# Patient Record
Sex: Male | Born: 1950
Health system: Southern US, Community
[De-identification: ages and names within clinical notes are randomized; demographics above are authoritative.]

## PROBLEM LIST (undated history)

## (undated) DIAGNOSIS — E0590 Thyrotoxicosis, unspecified without thyrotoxic crisis or storm: Secondary | ICD-10-CM

## (undated) DIAGNOSIS — L259 Unspecified contact dermatitis, unspecified cause: Secondary | ICD-10-CM

## (undated) DIAGNOSIS — T7840XA Allergy, unspecified, initial encounter: Secondary | ICD-10-CM

## (undated) DIAGNOSIS — D509 Iron deficiency anemia, unspecified: Secondary | ICD-10-CM

## (undated) DIAGNOSIS — K2270 Barrett's esophagus without dysplasia: Secondary | ICD-10-CM

## (undated) DIAGNOSIS — I10 Essential (primary) hypertension: Secondary | ICD-10-CM

## (undated) DIAGNOSIS — D510 Vitamin B12 deficiency anemia due to intrinsic factor deficiency: Secondary | ICD-10-CM

## (undated) DIAGNOSIS — E119 Type 2 diabetes mellitus without complications: Principal | ICD-10-CM

## (undated) DIAGNOSIS — Z86010 Personal history of colonic polyps: Secondary | ICD-10-CM

## (undated) DIAGNOSIS — E785 Hyperlipidemia, unspecified: Secondary | ICD-10-CM

## (undated) DIAGNOSIS — K219 Gastro-esophageal reflux disease without esophagitis: Secondary | ICD-10-CM

## (undated) HISTORY — DX: Hyperlipidemia, unspecified: E78.5

## (undated) HISTORY — DX: Barrett's esophagus without dysplasia: K22.70

## (undated) HISTORY — DX: Vitamin B12 deficiency anemia due to intrinsic factor deficiency: D51.0

## (undated) HISTORY — DX: Iron deficiency anemia, unspecified: D50.9

## (undated) HISTORY — PX: COLONOSCOPY: SHX174

## (undated) HISTORY — DX: Gastro-esophageal reflux disease without esophagitis: K21.9

## (undated) HISTORY — DX: Unspecified contact dermatitis, unspecified cause: L25.9

## (undated) HISTORY — PX: ESOPHAGOGASTRODUODENOSCOPY: SHX1529

## (undated) HISTORY — DX: Thyrotoxicosis, unspecified without thyrotoxic crisis or storm: E05.90

## (undated) HISTORY — DX: Allergy, unspecified, initial encounter: T78.40XA

## (undated) HISTORY — DX: Type 2 diabetes mellitus without complications: E11.9

## (undated) HISTORY — DX: Personal history of colonic polyps: Z86.010

## (undated) HISTORY — PX: POLYPECTOMY: SHX149

## (undated) HISTORY — DX: Essential (primary) hypertension: I10

---

## 1997-07-07 ENCOUNTER — Encounter: Admission: RE | Admit: 1997-07-07 | Discharge: 1997-10-05 | Payer: Self-pay | Admitting: Endocrinology

## 1998-03-28 HISTORY — PX: TRANSURETHRAL RESECTION OF PROSTATE: SHX73

## 1999-03-10 ENCOUNTER — Encounter: Admission: RE | Admit: 1999-03-10 | Discharge: 1999-03-10 | Payer: Self-pay | Admitting: Urology

## 1999-03-10 ENCOUNTER — Encounter: Payer: Self-pay | Admitting: Urology

## 1999-03-11 ENCOUNTER — Ambulatory Visit (HOSPITAL_BASED_OUTPATIENT_CLINIC_OR_DEPARTMENT_OTHER): Admission: RE | Admit: 1999-03-11 | Discharge: 1999-03-11 | Payer: Self-pay | Admitting: Urology

## 2002-01-24 DIAGNOSIS — K2270 Barrett's esophagus without dysplasia: Secondary | ICD-10-CM

## 2004-02-16 ENCOUNTER — Ambulatory Visit: Payer: Self-pay | Admitting: Endocrinology

## 2004-02-24 ENCOUNTER — Ambulatory Visit: Payer: Self-pay | Admitting: Endocrinology

## 2004-03-05 ENCOUNTER — Ambulatory Visit: Payer: Self-pay

## 2004-04-21 ENCOUNTER — Ambulatory Visit: Payer: Self-pay | Admitting: Endocrinology

## 2004-06-30 ENCOUNTER — Ambulatory Visit: Payer: Self-pay | Admitting: Internal Medicine

## 2004-07-22 ENCOUNTER — Ambulatory Visit: Payer: Self-pay | Admitting: Internal Medicine

## 2004-07-22 DIAGNOSIS — Z860100 Personal history of colon polyps, unspecified: Secondary | ICD-10-CM

## 2004-07-22 HISTORY — DX: Personal history of colon polyps, unspecified: Z86.0100

## 2004-07-22 HISTORY — DX: Personal history of colonic polyps: Z86.010

## 2004-07-22 LAB — HM COLONOSCOPY

## 2005-02-21 ENCOUNTER — Ambulatory Visit: Payer: Self-pay | Admitting: Endocrinology

## 2005-02-28 ENCOUNTER — Ambulatory Visit: Payer: Self-pay | Admitting: Endocrinology

## 2006-03-23 ENCOUNTER — Ambulatory Visit: Payer: Self-pay | Admitting: Endocrinology

## 2006-03-23 LAB — CONVERTED CEMR LAB
ALT: 26 units/L (ref 0–40)
AST: 22 units/L (ref 0–37)
Albumin: 4.2 g/dL (ref 3.5–5.2)
Alkaline Phosphatase: 44 units/L (ref 39–117)
BUN: 11 mg/dL (ref 6–23)
Basophils Absolute: 0 10*3/uL (ref 0.0–0.1)
Basophils Relative: 0.1 % (ref 0.0–1.0)
Bilirubin Urine: NEGATIVE
CO2: 26 meq/L (ref 19–32)
Calcium: 9.8 mg/dL (ref 8.4–10.5)
Chloride: 107 meq/L (ref 96–112)
Chol/HDL Ratio, serum: 3
Cholesterol: 144 mg/dL (ref 0–200)
Creatinine, Ser: 1.4 mg/dL (ref 0.4–1.5)
Eosinophil percent: 2.7 % (ref 0.0–5.0)
GFR calc non Af Amer: 56 mL/min
Glomerular Filtration Rate, Af Am: 68 mL/min/{1.73_m2}
Glucose, Bld: 112 mg/dL — ABNORMAL HIGH (ref 70–99)
HCT: 37.8 % — ABNORMAL LOW (ref 39.0–52.0)
HDL: 47.5 mg/dL (ref >39.0)
Hemoglobin: 13 g/dL (ref 13.0–17.0)
Ketones, ur: NEGATIVE mg/dL
LDL Cholesterol: 60 mg/dL (ref 0–99)
Leukocytes, UA: NEGATIVE
Lymphocytes Relative: 47.7 % — ABNORMAL HIGH (ref 12.0–46.0)
MCHC: 34.3 g/dL (ref 30.0–36.0)
MCV: 92.9 fL (ref 78.0–100.0)
Monocytes Absolute: 0.4 10*3/uL (ref 0.2–0.7)
Monocytes Relative: 8.5 % (ref 3.0–11.0)
Neutro Abs: 1.8 10*3/uL (ref 1.4–7.7)
Neutrophils Relative %: 41 % — ABNORMAL LOW (ref 43.0–77.0)
Nitrite: NEGATIVE
PSA: 0.71 ng/mL (ref 0.10–4.00)
Platelets: 343 10*3/uL (ref 150–400)
Potassium: 4.2 meq/L (ref 3.5–5.1)
RBC: 4.07 M/uL — ABNORMAL LOW (ref 4.22–5.81)
RDW: 12.4 % (ref 11.5–14.6)
Sodium: 141 meq/L (ref 135–145)
Specific Gravity, Urine: >=1.03 (ref 1.000–1.03)
TSH: 0.27 microintl units/mL — ABNORMAL LOW (ref 0.35–5.50)
Total Bilirubin: 0.9 mg/dL (ref 0.3–1.2)
Total Protein, Urine: NEGATIVE mg/dL
Total Protein: 7.4 g/dL (ref 6.0–8.3)
Triglyceride fasting, serum: 183 mg/dL — ABNORMAL HIGH (ref 0–149)
Urine Glucose: NEGATIVE mg/dL
Urobilinogen, UA: 0.2 (ref 0.0–1.0)
VLDL: 37 mg/dL (ref 0–40)
WBC: 4.5 10*3/uL (ref 4.5–10.5)
pH: 5.5 (ref 5.0–8.0)

## 2006-03-30 ENCOUNTER — Ambulatory Visit: Payer: Self-pay | Admitting: Endocrinology

## 2006-05-09 ENCOUNTER — Ambulatory Visit: Payer: Self-pay | Admitting: Endocrinology

## 2006-05-09 LAB — CONVERTED CEMR LAB
Hgb A1c MFr Bld: 6.8 % — ABNORMAL HIGH (ref 4.6–6.0)
TSH: 0.87 microintl units/mL (ref 0.35–5.50)
Vitamin B-12: 225 pg/mL (ref 211–911)

## 2006-07-31 ENCOUNTER — Ambulatory Visit: Payer: Self-pay | Admitting: Internal Medicine

## 2006-12-13 ENCOUNTER — Ambulatory Visit: Payer: Self-pay | Admitting: Endocrinology

## 2006-12-30 ENCOUNTER — Encounter: Payer: Self-pay | Admitting: *Deleted

## 2006-12-30 DIAGNOSIS — E785 Hyperlipidemia, unspecified: Secondary | ICD-10-CM

## 2006-12-30 DIAGNOSIS — D510 Vitamin B12 deficiency anemia due to intrinsic factor deficiency: Secondary | ICD-10-CM | POA: Insufficient documentation

## 2006-12-30 DIAGNOSIS — K219 Gastro-esophageal reflux disease without esophagitis: Secondary | ICD-10-CM

## 2006-12-30 DIAGNOSIS — E119 Type 2 diabetes mellitus without complications: Secondary | ICD-10-CM

## 2006-12-30 DIAGNOSIS — E1169 Type 2 diabetes mellitus with other specified complication: Secondary | ICD-10-CM

## 2006-12-30 DIAGNOSIS — E1159 Type 2 diabetes mellitus with other circulatory complications: Secondary | ICD-10-CM

## 2006-12-30 DIAGNOSIS — I10 Essential (primary) hypertension: Secondary | ICD-10-CM

## 2006-12-30 HISTORY — DX: Vitamin B12 deficiency anemia due to intrinsic factor deficiency: D51.0

## 2006-12-30 HISTORY — DX: Gastro-esophageal reflux disease without esophagitis: K21.9

## 2006-12-30 HISTORY — DX: Essential (primary) hypertension: I10

## 2006-12-30 HISTORY — DX: Type 2 diabetes mellitus without complications: E11.9

## 2006-12-30 HISTORY — DX: Hyperlipidemia, unspecified: E78.5

## 2007-04-26 ENCOUNTER — Ambulatory Visit: Payer: Self-pay | Admitting: Endocrinology

## 2007-04-26 LAB — CONVERTED CEMR LAB
ALT: 23 units/L (ref 0–53)
AST: 21 units/L (ref 0–37)
Albumin: 4.1 g/dL (ref 3.5–5.2)
Alkaline Phosphatase: 40 units/L (ref 39–117)
BUN: 16 mg/dL (ref 6–23)
Basophils Absolute: 0 10*3/uL (ref 0.0–0.1)
Basophils Relative: 1 % (ref 0.0–1.0)
Bilirubin Urine: NEGATIVE
Bilirubin, Direct: 0.2 mg/dL (ref 0.0–0.3)
CO2: 28 meq/L (ref 19–32)
Calcium: 9.9 mg/dL (ref 8.4–10.5)
Chloride: 103 meq/L (ref 96–112)
Cholesterol: 138 mg/dL (ref 0–200)
Creatinine, Ser: 1.4 mg/dL (ref 0.4–1.5)
Creatinine,U: 283 mg/dL
Eosinophils Absolute: 0.2 10*3/uL (ref 0.0–0.6)
Eosinophils Relative: 3.2 % (ref 0.0–5.0)
GFR calc Af Amer: 67 mL/min
GFR calc non Af Amer: 56 mL/min
Glucose, Bld: 94 mg/dL (ref 70–99)
HCT: 35.6 % — ABNORMAL LOW (ref 39.0–52.0)
HDL: 66.2 mg/dL (ref >39.0)
Hemoglobin, Urine: NEGATIVE
Hemoglobin: 11.9 g/dL — ABNORMAL LOW (ref 13.0–17.0)
Hgb A1c MFr Bld: 6.6 % — ABNORMAL HIGH (ref 4.6–6.0)
LDL Cholesterol: 47 mg/dL (ref 0–99)
Leukocytes, UA: NEGATIVE
Lymphocytes Relative: 39.2 % (ref 12.0–46.0)
MCHC: 33.3 g/dL (ref 30.0–36.0)
MCV: 93.3 fL (ref 78.0–100.0)
Microalb Creat Ratio: 33.2 mg/g — ABNORMAL HIGH (ref 0.0–30.0)
Microalb, Ur: 9.4 mg/dL — ABNORMAL HIGH (ref 0.0–1.9)
Monocytes Absolute: 0.4 10*3/uL (ref 0.2–0.7)
Monocytes Relative: 7.5 % (ref 3.0–11.0)
Neutro Abs: 2.3 10*3/uL (ref 1.4–7.7)
Neutrophils Relative %: 49.1 % (ref 43.0–77.0)
Nitrite: NEGATIVE
PSA: 0.64 ng/mL (ref 0.10–4.00)
Platelets: 277 10*3/uL (ref 150–400)
Potassium: 4.7 meq/L (ref 3.5–5.1)
RBC: 3.81 M/uL — ABNORMAL LOW (ref 4.22–5.81)
RDW: 12.9 % (ref 11.5–14.6)
Sodium: 140 meq/L (ref 135–145)
Specific Gravity, Urine: 1.025 (ref 1.000–1.03)
TSH: 0.63 microintl units/mL (ref 0.35–5.50)
Total Bilirubin: 0.9 mg/dL (ref 0.3–1.2)
Total CHOL/HDL Ratio: 2.1
Total Protein: 7.5 g/dL (ref 6.0–8.3)
Triglycerides: 124 mg/dL (ref 0–149)
Urine Glucose: NEGATIVE mg/dL
Urobilinogen, UA: 0.2 (ref 0.0–1.0)
VLDL: 25 mg/dL (ref 0–40)
WBC: 4.7 10*3/uL (ref 4.5–10.5)
pH: 6 (ref 5.0–8.0)

## 2007-05-03 ENCOUNTER — Ambulatory Visit: Payer: Self-pay | Admitting: Endocrinology

## 2007-05-15 ENCOUNTER — Ambulatory Visit: Payer: Self-pay | Admitting: Internal Medicine

## 2007-05-22 ENCOUNTER — Telehealth (INDEPENDENT_AMBULATORY_CARE_PROVIDER_SITE_OTHER): Payer: Self-pay | Admitting: *Deleted

## 2007-05-30 ENCOUNTER — Encounter: Payer: Self-pay | Admitting: Internal Medicine

## 2007-05-30 ENCOUNTER — Ambulatory Visit: Payer: Self-pay | Admitting: Internal Medicine

## 2007-06-27 ENCOUNTER — Telehealth: Payer: Self-pay | Admitting: Endocrinology

## 2007-06-27 ENCOUNTER — Encounter: Payer: Self-pay | Admitting: Endocrinology

## 2008-01-03 ENCOUNTER — Telehealth: Payer: Self-pay | Admitting: Endocrinology

## 2008-05-05 ENCOUNTER — Ambulatory Visit: Payer: Self-pay | Admitting: Endocrinology

## 2008-05-16 ENCOUNTER — Ambulatory Visit: Payer: Self-pay | Admitting: Internal Medicine

## 2008-05-27 ENCOUNTER — Encounter: Payer: Self-pay | Admitting: Internal Medicine

## 2008-05-27 ENCOUNTER — Ambulatory Visit: Payer: Self-pay | Admitting: Internal Medicine

## 2008-05-30 ENCOUNTER — Encounter: Payer: Self-pay | Admitting: Internal Medicine

## 2008-06-03 ENCOUNTER — Ambulatory Visit: Payer: Self-pay | Admitting: Endocrinology

## 2008-06-03 DIAGNOSIS — E0590 Thyrotoxicosis, unspecified without thyrotoxic crisis or storm: Secondary | ICD-10-CM

## 2008-06-03 HISTORY — DX: Thyrotoxicosis, unspecified without thyrotoxic crisis or storm: E05.90

## 2008-08-12 ENCOUNTER — Ambulatory Visit: Payer: Self-pay | Admitting: Endocrinology

## 2008-08-16 LAB — CONVERTED CEMR LAB
Hgb A1c MFr Bld: 6.3 % (ref 4.6–6.5)
TSH: 0.62 microintl units/mL (ref 0.35–5.50)

## 2009-04-28 ENCOUNTER — Ambulatory Visit: Payer: Self-pay | Admitting: Endocrinology

## 2009-04-29 LAB — CONVERTED CEMR LAB
ALT: 22 units/L (ref 0–53)
AST: 19 units/L (ref 0–37)
Albumin: 3.8 g/dL (ref 3.5–5.2)
Alkaline Phosphatase: 45 units/L (ref 39–117)
BUN: 14 mg/dL (ref 6–23)
Basophils Absolute: 0.1 10*3/uL (ref 0.0–0.1)
Basophils Relative: 0.9 % (ref 0.0–3.0)
Bilirubin Urine: NEGATIVE
Bilirubin, Direct: 0.2 mg/dL (ref 0.0–0.3)
CO2: 29 meq/L (ref 19–32)
Calcium: 9.5 mg/dL (ref 8.4–10.5)
Chloride: 103 meq/L (ref 96–112)
Cholesterol: 134 mg/dL (ref 0–200)
Creatinine, Ser: 1.3 mg/dL (ref 0.4–1.5)
Creatinine,U: 190.2 mg/dL
Eosinophils Absolute: 0.2 10*3/uL (ref 0.0–0.7)
Eosinophils Relative: 3.4 % (ref 0.0–5.0)
GFR calc non Af Amer: 72.68 mL/min (ref >60)
Glucose, Bld: 102 mg/dL — ABNORMAL HIGH (ref 70–99)
HCT: 36.4 % — ABNORMAL LOW (ref 39.0–52.0)
HDL: 70.6 mg/dL (ref >39.00)
Hemoglobin, Urine: NEGATIVE
Hemoglobin: 11.9 g/dL — ABNORMAL LOW (ref 13.0–17.0)
Hgb A1c MFr Bld: 6.5 % (ref 4.6–6.5)
Ketones, ur: NEGATIVE mg/dL
LDL Cholesterol: 28 mg/dL (ref 0–99)
Leukocytes, UA: NEGATIVE
Lymphocytes Relative: 39 % (ref 12.0–46.0)
Lymphs Abs: 2.2 10*3/uL (ref 0.7–4.0)
MCHC: 32.6 g/dL (ref 30.0–36.0)
MCV: 101 fL — ABNORMAL HIGH (ref 78.0–100.0)
Microalb Creat Ratio: 10.5 mg/g (ref 0.0–30.0)
Microalb, Ur: 2 mg/dL — ABNORMAL HIGH (ref 0.0–1.9)
Monocytes Absolute: 0.4 10*3/uL (ref 0.1–1.0)
Monocytes Relative: 6.7 % (ref 3.0–12.0)
Neutro Abs: 2.8 10*3/uL (ref 1.4–7.7)
Neutrophils Relative %: 50 % (ref 43.0–77.0)
Nitrite: NEGATIVE
PSA: 0.99 ng/mL (ref 0.10–4.00)
Platelets: 335 10*3/uL (ref 150.0–400.0)
Potassium: 4.5 meq/L (ref 3.5–5.1)
RBC: 3.61 M/uL — ABNORMAL LOW (ref 4.22–5.81)
RDW: 13.2 % (ref 11.5–14.6)
Sodium: 139 meq/L (ref 135–145)
Specific Gravity, Urine: 1.02 (ref 1.000–1.030)
TSH: 0.35 microintl units/mL (ref 0.35–5.50)
Total Bilirubin: 0.8 mg/dL (ref 0.3–1.2)
Total CHOL/HDL Ratio: 2
Total Protein, Urine: NEGATIVE mg/dL
Total Protein: 7.5 g/dL (ref 6.0–8.3)
Triglycerides: 178 mg/dL — ABNORMAL HIGH (ref 0.0–149.0)
Urine Glucose: NEGATIVE mg/dL
Urobilinogen, UA: 0.2 (ref 0.0–1.0)
VLDL: 35.6 mg/dL (ref 0.0–40.0)
WBC: 5.7 10*3/uL (ref 4.5–10.5)
pH: 5.5 (ref 5.0–8.0)

## 2009-05-15 ENCOUNTER — Ambulatory Visit: Payer: Self-pay | Admitting: Endocrinology

## 2009-05-15 DIAGNOSIS — L259 Unspecified contact dermatitis, unspecified cause: Secondary | ICD-10-CM

## 2009-05-15 HISTORY — DX: Unspecified contact dermatitis, unspecified cause: L25.9

## 2009-05-18 ENCOUNTER — Telehealth: Payer: Self-pay | Admitting: Endocrinology

## 2009-05-20 ENCOUNTER — Encounter: Payer: Self-pay | Admitting: Endocrinology

## 2009-12-21 ENCOUNTER — Telehealth: Payer: Self-pay | Admitting: Endocrinology

## 2009-12-23 ENCOUNTER — Ambulatory Visit: Payer: Self-pay | Admitting: Endocrinology

## 2009-12-23 DIAGNOSIS — D509 Iron deficiency anemia, unspecified: Secondary | ICD-10-CM

## 2009-12-23 HISTORY — DX: Iron deficiency anemia, unspecified: D50.9

## 2009-12-23 LAB — CONVERTED CEMR LAB
Basophils Absolute: 0 10*3/uL (ref 0.0–0.1)
Basophils Relative: 0.7 % (ref 0.0–3.0)
Eosinophils Absolute: 0.2 10*3/uL (ref 0.0–0.7)
Eosinophils Relative: 3.4 % (ref 0.0–5.0)
HCT: 31.3 % — ABNORMAL LOW (ref 39.0–52.0)
Hemoglobin: 10.9 g/dL — ABNORMAL LOW (ref 13.0–17.0)
Hgb A1c MFr Bld: 7 % — ABNORMAL HIGH (ref 4.6–6.5)
Iron: 65 ug/dL (ref 42–165)
Lymphocytes Relative: 36.9 % (ref 12.0–46.0)
Lymphs Abs: 1.7 10*3/uL (ref 0.7–4.0)
MCHC: 34.9 g/dL (ref 30.0–36.0)
MCV: 100.2 fL — ABNORMAL HIGH (ref 78.0–100.0)
Monocytes Absolute: 0.4 10*3/uL (ref 0.1–1.0)
Monocytes Relative: 9.2 % (ref 3.0–12.0)
Neutro Abs: 2.4 10*3/uL (ref 1.4–7.7)
Neutrophils Relative %: 49.8 % (ref 43.0–77.0)
Platelets: 221 10*3/uL (ref 150.0–400.0)
RBC: 3.12 M/uL — ABNORMAL LOW (ref 4.22–5.81)
RDW: 13.8 % (ref 11.5–14.6)
Saturation Ratios: 16 % — ABNORMAL LOW (ref 20.0–50.0)
Transferrin: 289.6 mg/dL (ref 212.0–360.0)
WBC: 4.7 10*3/uL (ref 4.5–10.5)

## 2010-04-25 LAB — CONVERTED CEMR LAB
ALT: 25 units/L (ref 0–53)
AST: 19 units/L (ref 0–37)
Albumin: 4.1 g/dL (ref 3.5–5.2)
Alkaline Phosphatase: 41 units/L (ref 39–117)
BUN: 19 mg/dL (ref 6–23)
Bacteria, UA: NEGATIVE
Basophils Absolute: 0.1 10*3/uL (ref 0.0–0.1)
Basophils Relative: 1.8 % (ref 0.0–3.0)
Bilirubin Urine: NEGATIVE
Bilirubin, Direct: 0.1 mg/dL (ref 0.0–0.3)
CO2: 28 meq/L (ref 19–32)
Calcium: 9.4 mg/dL (ref 8.4–10.5)
Chloride: 100 meq/L (ref 96–112)
Cholesterol: 138 mg/dL (ref 0–200)
Creatinine, Ser: 1.3 mg/dL (ref 0.4–1.5)
Crystals: NEGATIVE
Eosinophils Absolute: 0.1 10*3/uL (ref 0.0–0.7)
Eosinophils Relative: 1.3 % (ref 0.0–5.0)
GFR calc Af Amer: 73 mL/min
GFR calc non Af Amer: 60 mL/min
Glucose, Bld: 86 mg/dL (ref 70–99)
HCT: 35.4 % — ABNORMAL LOW (ref 39.0–52.0)
HDL: 68 mg/dL (ref >39.0)
Hemoglobin, Urine: NEGATIVE
Hemoglobin: 12.2 g/dL — ABNORMAL LOW (ref 13.0–17.0)
Hgb A1c MFr Bld: 6.7 % — ABNORMAL HIGH (ref 4.6–6.0)
LDL Cholesterol: 55 mg/dL (ref 0–99)
Leukocytes, UA: NEGATIVE
Lymphocytes Relative: 36.9 % (ref 12.0–46.0)
MCHC: 34.5 g/dL (ref 30.0–36.0)
MCV: 94.1 fL (ref 78.0–100.0)
Monocytes Absolute: 0.3 10*3/uL (ref 0.1–1.0)
Monocytes Relative: 5 % (ref 3.0–12.0)
Mucus, UA: NEGATIVE
Neutro Abs: 2.8 10*3/uL (ref 1.4–7.7)
Neutrophils Relative %: 55 % (ref 43.0–77.0)
Nitrite: NEGATIVE
PSA: 0.59 ng/mL (ref 0.10–4.00)
Platelets: 221 10*3/uL (ref 150–400)
Potassium: 4.9 meq/L (ref 3.5–5.1)
RBC: 3.76 M/uL — ABNORMAL LOW (ref 4.22–5.81)
RDW: 12.5 % (ref 11.5–14.6)
Sodium: 137 meq/L (ref 135–145)
Specific Gravity, Urine: 1.025 (ref 1.000–1.03)
TSH: 0.29 microintl units/mL — ABNORMAL LOW (ref 0.35–5.50)
Total Bilirubin: 0.8 mg/dL (ref 0.3–1.2)
Total CHOL/HDL Ratio: 2
Total Protein: 7 g/dL (ref 6.0–8.3)
Triglycerides: 77 mg/dL (ref 0–149)
Urine Glucose: NEGATIVE mg/dL
Urobilinogen, UA: 0.2 (ref 0.0–1.0)
VLDL: 15 mg/dL (ref 0–40)
WBC: 5.3 10*3/uL (ref 4.5–10.5)
pH: 5.5 (ref 5.0–8.0)

## 2010-04-27 NOTE — Progress Notes (Signed)
Summary: OV due  Phone Note Outgoing Call Call back at [facility] Phone [phone]   Call placed by: Brenton Grills MA,  December 21, 2009 3:23 PM Call placed to: Patient Details for Reason: OV due Summary of Call: Per MD, pt is due for F/U. Left message on VM to callback  Follow-up for Phone Call        appointment scheduled 12/25/2009 1:00pm Follow-up by: Brenton Grills MA,  December 22, 2009 11:39 AM

## 2010-04-27 NOTE — Assessment & Plan Note (Signed)
Summary: PER ASHLEY FU  STC   Vital Signs:  Patient profile:   60 year old male Height:      75 inches (190.50 cm) Weight:      254.38 pounds (115.63 kg) BMI:     31.91 O2 Sat:      96 % on Room air Temp:     98.4 degrees F (36.89 degrees C) oral Pulse rate:   84 / minute BP sitting:   118 / 72  (left arm) Cuff size:   large  Vitals Entered By: Brenton Grills MA (December 23, 2009 9:11 AM)  O2 Flow:  Room air CC: F/U appt/aj Is Patient Diabetic? Yes   CC:  F/U appt/aj.  History of Present Illness: pt states he feels well in general.  no cbg record, but states cbg's have increased to the mid-100's.  Current Medications (verified): 1)  Glucophage 1000 Mg  Tabs (Metformin Hcl) .... Take 1 By Mouth Qd 2)  Actos 45 Mg  Tabs (Pioglitazone Hcl) .... Take 1 By Mouth Qd 3)  Lisinopril-Hydrochlorothiazide 20-12.5 Mg  Tabs (Lisinopril-Hydrochlorothiazide) .... Take 2 By Mouth Qd 4)  Vytorin 10-80 Mg  Tabs (Ezetimibe-Simvastatin) .... Take 1 By Mouth Qd 5)  Cyanocobalamin 1000 Mcg/ml Inj Soln (Cyanocobalamin) .... Take 1000 Mcg Im Q Month 6)  Carvedilol 3.125 Mg Tabs (Carvedilol) .... Bid 7)  Prilosec Otc 20 Mg Tbec (Omeprazole Magnesium) .... Qd 8)  Onglyza 5 Mg Tabs (Saxagliptin Hcl) .Marland Kitchen.. 1 Qam 9)  Aspirin 81 Mg Tabs (Aspirin) .Marland Kitchen.. 1 Qd 10)  Viagra 100 Mg Tabs (Sildenafil Citrate) .... For As Needed Use 11)  Bd Luer-Lok Syringe 25g X 1" 3 Ml Misc (Syringe/needle (Disp)) .Marland Kitchen.. 1 Q Month  Allergies (verified): No Known Drug Allergies  Past History:  Past Medical History: Last updated: 06/03/2008 ETOH 0-6 a day HYPERTHYROIDISM (ICD-242.90) SPECIAL SCREENING MALIGNANT NEOPLASM OF PROSTATE (ICD-V76.44) ROUTINE GENERAL MEDICAL EXAM@HEALTH  CARE FACL (ICD-V70.0) PERNICIOUS ANEMIA (ICD-281.0) HYPERTENSION (ICD-401.9) HYPERLIPIDEMIA (ICD-272.4) GERD (ICD-530.81) DIABETES MELLITUS, TYPE II (ICD-250.00)  Review of Systems  The patient denies hypoglycemia.    Physical  Exam  General:  obese.  no distress  Pulses:  dorsalis pedis intact bilat.  Extremities:  no deformity.  no ulcer on the feet.  feet are of normal color and temp.  no edema  Neurologic:  sensation is intact to touch on the feet  Additional Exam:  Hemoglobin           [L]  10.9 g/dL                   16.1-09.6 Hematocrit           [L]  31.3 % Iron Saturation      [L]  16.0 %                      20.0-50.0 Hemoglobin A1C       [H]  7.0 %            Impression & Recommendations:  Problem # 1:  DIABETES MELLITUS, TYPE II (ICD-250.00) needs increased rx, if it could be done safely  Problem # 2:  ANEMIA, IRON DEFICIENCY (ICD-280.9) needs increased rx  Other Orders: TLB-CBC Platelet - w/Differential (85025-CBCD) TLB-IBC Pnl (Iron/FE;Transferrin) (83550-IBC) TLB-A1C / Hgb A1C (Glycohemoglobin) (83036-A1C) Est. Patient Level III (04540)  Patient Instructions: 1)  blood tests are being ordered for you today.  please call [phone] to hear your test results. 2)  if you a1c  is high today, options are these 3 (based on the results, we may need to do more than 1 of these): 3)  increase metformin to 2000 mg once daily 4)  add bromocriptione (generic) 5)  add welchol (brand-name, but since this is a cholesterol pill, it would enable you to change vytorin to just the simvastatin component, which is generic).   6)  physical is due feb 2012. 7)  (update: i left message on phone-tree:  you could take same rx for dm, but i would increase rx if i was you.  take fe 1/day).

## 2010-04-27 NOTE — Progress Notes (Signed)
Summary: Rx request  Phone Note Call from Patient Call back at Home Phone [phone]   Caller: Patient Summary of Call: pt called requesting RX for Viagra Initial call taken by: Margaret Pyle, CMA,  May 18, 2009 1:08 PM  Follow-up for Phone Call        sent Follow-up by: Minus Breeding MD,  May 18, 2009 1:19 PM  Additional Follow-up for Phone Call Additional follow up Details #1::        pt informed Additional Follow-up by: Margaret Pyle, CMA,  May 18, 2009 1:23 PM    Prescriptions: VIAGRA 100 MG TABS (SILDENAFIL CITRATE) for as needed use  #10 x 11   Entered and Authorized by:   Minus Breeding MD   Signed by:   Minus Breeding MD on 05/18/2009   Method used:   [facility] to        [facility] [phone]* (retail)       [address]       [place], [place]  [address]       Ph: [phone] or [phone]       Fax: [phone]   RxID:   [id number]

## 2010-04-27 NOTE — Letter (Signed)
Summary: [facility]   Imported By: Sherian Rein 05/25/2009 14:49:01  _____________________________________________________________________  External Attachment:    Type:   Image     Comment:   External Document

## 2010-04-27 NOTE — Assessment & Plan Note (Signed)
Summary: CPX / NWS #   Vital Signs:  Patient profile:   60 year old male Height:      75 inches (190.50 cm) Weight:      242.38 pounds (110.17 kg) BMI:     30.40 O2 Sat:      97 % on Room air Temp:     98.4 degrees F (36.89 degrees C) oral Pulse rate:   70 / minute BP sitting:   128 / 78  (left arm) Cuff size:   large  Vitals Entered By: Josph Macho RMA (May 15, 2009 10:00 AM)  O2 Flow:  Room air CC: Physical/ CF   CC:  Physical/ CF.  History of Present Illness: here for regular wellness examination.  He's feeling pretty well in general, and does not smoke.  he requests a simpler med schedule.   Current Medications (verified): 1)  Aspirin 325 Mg  Tabs (Aspirin) .... Take 1 By Mouth Qd 2)  Glucophage 1000 Mg  Tabs (Metformin Hcl) .... Take 1 By Mouth Qd 3)  Actos 45 Mg  Tabs (Pioglitazone Hcl) .... Take 1 By Mouth Qd 4)  Lisinopril-Hydrochlorothiazide 20-12.5 Mg  Tabs (Lisinopril-Hydrochlorothiazide) .... Take 2 By Mouth Qd 5)  Vytorin 10-80 Mg  Tabs (Ezetimibe-Simvastatin) .... Take 1 By Mouth Qd 6)  Cyanocobalamin 1000 Mcg/ml Inj Soln (Cyanocobalamin) .... Take 1 Injection Q Month 7)  Prandin 2 Mg  Tabs (Repaglinide) .... Take 1 By Mouth Three Times A Day Qd 8)  Carvedilol 3.125 Mg Tabs (Carvedilol) .... Bid 9)  Prilosec Otc 20 Mg Tbec (Omeprazole Magnesium) .... Qd  Allergies (verified): No Known Drug Allergies  Past History:  Past Medical History: Last updated: 06/03/2008 ETOH 0-6 a day HYPERTHYROIDISM (ICD-242.90) SPECIAL SCREENING MALIGNANT NEOPLASM OF PROSTATE (ICD-V76.44) ROUTINE GENERAL MEDICAL EXAM@HEALTH  CARE FACL (ICD-V70.0) PERNICIOUS ANEMIA (ICD-281.0) HYPERTENSION (ICD-401.9) HYPERLIPIDEMIA (ICD-272.4) GERD (ICD-530.81) DIABETES MELLITUS, TYPE II (ICD-250.00)  Family History: Reviewed history from 05/03/2007 and no changes required. no cancer  Social History: Reviewed history from 06/03/2008 and no changes required. 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    Physical Exam  General:  obese.  no distress Head:  head: no deformity eyes: no periorbital swelling, no proptosis external nose and ears are normal mouth: no lesion seen Neck:  Supple without thyroid enlargement or tenderness. No cervical lymphadenopathy, neck masses or tracheal deviation.  Lungs:  Clear to auscultation bilaterally. Normal respiratory effort.  Heart:  Regular rate and rhythm without murmurs or gallops noted. Normal S1,S2.   Abdomen:  abdomen is soft, nontender.  no hepatosplenomegaly.   not distended.  no hernia  Rectal:  normal external and internal exam.  heme neg  Prostate:  Normal size prostate without masses or tenderness.  Msk:  muscle bulk and strength are grossly normal.  no obvious joint swelling.  gait is normal and steady  Pulses:  dorsalis pedis intact bilat.  no carotid bruit  Extremities:  no deformity.  no ulcer on the feet.  feet are of normal color and temp.  no edema  Neurologic:  cn 2-12 grossly intact.   readily moves all 4's.   sensation is intact to touch on the feet  Skin:  normal texture and temp.  no rash.  not diaphoretic  Cervical  Nodes:  No significant adenopathy.  Psych:  Alert and cooperative; normal mood and affect; normal attention span and concentration.     Impression & Recommendations:  Problem # 1:  ROUTINE GENERAL MEDICAL EXAM@HEALTH  CARE FACL (ICD-V70.0)  Medications Added to Medication List This Visit: 1)  Cyanocobalamin 1000 Mcg/ml Inj Soln (Cyanocobalamin) .... Take 1000 mcg im q month 2)  Onglyza 5 Mg Tabs (Saxagliptin hcl) .Marland Kitchen.. 1 qam 3)  Aspirin 81 Mg Tabs (Aspirin) .Marland Kitchen.. 1  qd 4)  Viagra 100 Mg Tabs (Sildenafil citrate) .... For as needed use 5)  Bd Luer-lok Syringe 25g X 1" 3 Ml Misc (Syringe/needle (disp)) .Marland Kitchen.. 1 q month  Other Orders: EKG w/ Interpretation (93000) Vit B12 1000 mcg (J3420) Admin of Therapeutic Inj  intramuscular or subcutaneous ([address]109) Pneumococcal Vaccine (60454) Admin 1st Vaccine (09811) Est. Patient 40-64 years (91478)  Patient Instructions: 1)  change prandin to onglyza 5 mg each am. 2)  a1c in 3-4 months 250.00. 3)  i sent rx to pharmacy so pt's wife (nurse) could give pt b-12 at home. Prescriptions: BD LUER-LOK SYRINGE 25G X 1" 3 ML MISC (SYRINGE/NEEDLE (DISP)) 1 q month  #5 x 2   Entered and Authorized by:   Minus Breeding MD   Signed by:   Minus Breeding MD on 05/15/2009   Method used:   Electronically to        [facility] [phone]* (retail)       [address]       [place], [place]  [address]       Ph: [phone] or [phone]       Fax: [phone]   RxID:   [id number] CYANOCOBALAMIN 1000 MCG/ML INJ SOLN (CYANOCOBALAMIN) take 1000 mcg im q month  #1 bottle x 5   Entered and Authorized by:   Minus Breeding MD   Signed by:   Minus Breeding MD on 05/15/2009   Method used:   Electronically to        [facility] [phone]* (retail)       [address]       [place], [place]  [address]       Ph: [phone] or [phone]       Fax: [phone]   RxID:   [id number] CARVEDILOL 3.125 MG TABS (CARVEDILOL) bid  #60 x 11   Entered and Authorized by:   Minus Breeding MD   Signed by:   Minus Breeding MD on 05/15/2009   Method used:   Electronically to        [facility] [phone]* (retail)       [address]       [place], [place]  [address]       Ph: [phone] or [phone]       Fax: [phone]   RxID:   [id number] VYTORIN 10-80 MG  TABS (EZETIMIBE-SIMVASTATIN) take 1 by mouth qd  #30 x 11   Entered and Authorized by:   Minus Breeding MD   Signed by:   Minus Breeding MD on 05/15/2009   Method used:    Electronically to        [facility] [phone]* (retail)       [address]       [place], [place]  [address]       Ph: [phone] or [phone]       Fax: [phone]   RxID:   [id number] LISINOPRIL-HYDROCHLOROTHIAZIDE 20-12.5  MG  TABS (LISINOPRIL-HYDROCHLOROTHIAZIDE) take 2 by mouth qd  #60 x 11   Entered and Authorized by:   Minus Breeding MD   Signed by:   Minus Breeding MD on 05/15/2009   Method used:   Electronically to        [facility] [phone]* (retail)       [address]       [place], [place]  [address]       Ph: [phone] or [phone]       Fax: [phone]   RxID:   [id number] ACTOS 45 MG  TABS (PIOGLITAZONE HCL) take 1 by mouth qd  #30 x 11   Entered and Authorized by:   Minus Breeding MD   Signed by:   Minus Breeding MD on 05/15/2009   Method used:   Electronically to        [facility] [phone]* (retail)       [address]       [place], [place]  [address]       Ph: [phone] or [phone]       Fax: [phone]   RxID:   [id number] GLUCOPHAGE 1000 MG  TABS (METFORMIN HCL) take 1 by mouth qd  #30 x 11   Entered and Authorized by:   Minus Breeding MD   Signed by:   Minus Breeding MD on 05/15/2009   Method used:   Electronically to        [facility] [phone]* (retail)       [address]       [place], [place]  [address]       Ph: [phone] or [phone]       Fax: [phone]   RxID:   [id number] ONGLYZA 5 MG TABS (SAXAGLIPTIN HCL) 1 qam  #30 x 11   Entered and Authorized by:   Minus Breeding MD   Signed by:   Minus Breeding MD on 05/15/2009   Method used:   Electronically to        [facility] [phone]* (retail)       [address]       [place], [place]  [address]       Ph: [phone] or [phone]       Fax: [phone]   RxID:   [id number]   Preventive Care Screening  Last Flu Shot:    Date:  12/26/2008    Results:  historical     Medication Administration  Injection # 1:    Medication: Vit B12 1000 mcg     Diagnosis: PERNICIOUS ANEMIA (ICD-281.0)    Route: IM    Site: L deltoid    Exp Date: 9/12    Lot #: [id number]    Mfr: [employer] Regent    Patient tolerated injection without complications    Given by: Josph Macho RMA (May 15, 2009 10:17 AM)  Orders Added: 1)  EKG w/ Interpretation [93000] 2)  Vit B12 1000 mcg [J3420] 3)  Admin of Therapeutic Inj  intramuscular or subcutaneous [96372] 4)  Pneumococcal Vaccine [90732] 5)  Admin 1st Vaccine [90471] 6)  Est. Patient 40-64 years [99396]   Preventive Care Screening  Last Flu Shot:    Date:  12/26/2008    Results:  historical     Immunizations Administered:  Pneumonia Vaccine:    Vaccine Type: Pneumovax    Site: right deltoid    Mfr: Merck  Dose: 0.5 ml    Route: IM    Given by: Josph Macho RMA    Exp. Date: 04/23/2010    Lot #: [id number]    VIS given: 10/24/95 version given May 15, 2009.

## 2010-05-10 ENCOUNTER — Other Ambulatory Visit: Payer: Self-pay | Admitting: Endocrinology

## 2010-05-10 ENCOUNTER — Encounter (INDEPENDENT_AMBULATORY_CARE_PROVIDER_SITE_OTHER): Payer: Self-pay | Admitting: *Deleted

## 2010-05-10 ENCOUNTER — Other Ambulatory Visit: Payer: 59

## 2010-05-10 DIAGNOSIS — Z0389 Encounter for observation for other suspected diseases and conditions ruled out: Secondary | ICD-10-CM

## 2010-05-10 DIAGNOSIS — E119 Type 2 diabetes mellitus without complications: Secondary | ICD-10-CM

## 2010-05-10 DIAGNOSIS — Z0000 Encounter for general adult medical examination without abnormal findings: Secondary | ICD-10-CM

## 2010-05-10 LAB — CBC WITH DIFFERENTIAL/PLATELET
Basophils Absolute: 0 10*3/uL (ref 0.0–0.1)
Eosinophils Absolute: 0.1 10*3/uL (ref 0.0–0.7)
HCT: 33.5 % — ABNORMAL LOW (ref 39.0–52.0)
Hemoglobin: 11.3 g/dL — ABNORMAL LOW (ref 13.0–17.0)
Lymphs Abs: 1.6 10*3/uL (ref 0.7–4.0)
MCHC: 33.8 g/dL (ref 30.0–36.0)
MCV: 97.2 fl (ref 78.0–100.0)
Monocytes Absolute: 0.3 10*3/uL (ref 0.1–1.0)
Neutro Abs: 2 10*3/uL (ref 1.4–7.7)
Platelets: 229 10*3/uL (ref 150.0–400.0)
RDW: 13.9 % (ref 11.5–14.6)

## 2010-05-10 LAB — MICROALBUMIN / CREATININE URINE RATIO: Microalb, Ur: 6.3 mg/dL — ABNORMAL HIGH (ref 0.0–1.9)

## 2010-05-10 LAB — URINALYSIS
Bilirubin Urine: NEGATIVE
Hgb urine dipstick: NEGATIVE
Nitrite: NEGATIVE
Total Protein, Urine: NEGATIVE
Urine Glucose: NEGATIVE
pH: 5.5 (ref 5.0–8.0)

## 2010-05-10 LAB — BASIC METABOLIC PANEL
BUN: 21 mg/dL (ref 6–23)
CO2: 26 mEq/L (ref 19–32)
Chloride: 98 mEq/L (ref 96–112)
GFR: 67.6 mL/min (ref >60.00)
Glucose, Bld: 129 mg/dL — ABNORMAL HIGH (ref 70–99)
Potassium: 5 mEq/L (ref 3.5–5.1)
Sodium: 139 mEq/L (ref 135–145)

## 2010-05-10 LAB — HEPATIC FUNCTION PANEL
AST: 22 U/L (ref 0–37)
Albumin: 3.9 g/dL (ref 3.5–5.2)
Total Bilirubin: 0.9 mg/dL (ref 0.3–1.2)

## 2010-05-10 LAB — TSH: TSH: 0.59 u[IU]/mL (ref 0.35–5.50)

## 2010-05-10 LAB — LIPID PANEL
Cholesterol: 153 mg/dL (ref 0–200)
HDL: 84.5 mg/dL (ref >39.00)
VLDL: 29.6 mg/dL (ref 0.0–40.0)

## 2010-05-10 LAB — PSA: PSA: 1.43 ng/mL (ref 0.10–4.00)

## 2010-05-10 LAB — HEMOGLOBIN A1C: Hgb A1c MFr Bld: 6.9 % — ABNORMAL HIGH (ref 4.6–6.5)

## 2010-05-18 ENCOUNTER — Encounter: Payer: Self-pay | Admitting: Endocrinology

## 2010-05-18 ENCOUNTER — Encounter (INDEPENDENT_AMBULATORY_CARE_PROVIDER_SITE_OTHER): Payer: 59 | Admitting: Endocrinology

## 2010-05-18 DIAGNOSIS — E119 Type 2 diabetes mellitus without complications: Secondary | ICD-10-CM

## 2010-05-18 DIAGNOSIS — Z0000 Encounter for general adult medical examination without abnormal findings: Secondary | ICD-10-CM

## 2010-05-25 NOTE — Assessment & Plan Note (Signed)
Summary: CPX/NWS  #   Vital Signs:  Patient profile:   60 year old male Height:      75 inches (190.50 cm) Weight:      252.13 pounds (114.60 kg) BMI:     31.63 O2 Sat:      98 % on Room air Temp:     98.4 degrees F (36.89 degrees C) oral Pulse rate:   75 / minute Pulse rhythm:   regular BP sitting:   128 / 80  (left arm) Cuff size:   large  Vitals Entered By: Brenton Grills CMA Duncan Dull) (May 18, 2010 10:05 AM)  O2 Flow:  Room air CC: CPX/aj Is Patient Diabetic? Yes   CC:  CPX/aj.  History of Present Illness: here for regular wellness examination.  He's feeling pretty well in general, and does not smoke.  Current Medications (verified): 1)  Glucophage 1000 Mg  Tabs (Metformin Hcl) .... Take 1 By Mouth Qd 2)  Actos 45 Mg  Tabs (Pioglitazone Hcl) .... Take 1 By Mouth Qd 3)  Lisinopril-Hydrochlorothiazide 20-12.5 Mg  Tabs (Lisinopril-Hydrochlorothiazide) .... Take 2 By Mouth Qd 4)  Vytorin 10-80 Mg  Tabs (Ezetimibe-Simvastatin) .... Take 1 By Mouth Qd 5)  Cyanocobalamin 1000 Mcg/ml Inj Soln (Cyanocobalamin) .... Take 1000 Mcg Im Q Month 6)  Carvedilol 3.125 Mg Tabs (Carvedilol) .... Bid 7)  Prilosec Otc 20 Mg Tbec (Omeprazole Magnesium) .... Qd 8)  Onglyza 5 Mg Tabs (Saxagliptin Hcl) .Marland Kitchen.. 1 Qam 9)  Aspirin 81 Mg Tabs (Aspirin) .Marland Kitchen.. 1 Qd 10)  Viagra 100 Mg Tabs (Sildenafil Citrate) .... For As Needed Use 11)  Bd Luer-Lok Syringe 25g X 1" 3 Ml Misc (Syringe/needle (Disp)) .Marland Kitchen.. 1 Q Month  Allergies (verified): No Known Drug Allergies  Past History:  Past Medical History: Last updated: 06/03/2008 ETOH 0-6 a day HYPERTHYROIDISM (ICD-242.90) SPECIAL SCREENING MALIGNANT NEOPLASM OF PROSTATE (ICD-V76.44) ROUTINE GENERAL MEDICAL EXAM@HEALTH  CARE FACL (ICD-V70.0) PERNICIOUS ANEMIA (ICD-281.0) HYPERTENSION (ICD-401.9) HYPERLIPIDEMIA (ICD-272.4) GERD (ICD-530.81) DIABETES MELLITUS, TYPE II (ICD-250.00)  Family History: Reviewed history from 05/03/2007 and no changes  required. no cancer father decreased from mi at age 22  Social History: Reviewed history from 05/15/2009 and no changes required. 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         gerd is well-controlled.  denies decreased urinary stream  Physical Exam  General:  obese.  no distress  Head:  head: no deformity eyes: no periorbital swelling, no proptosis external nose and ears are normal mouth: no lesion seen Neck:  Supple without thyroid enlargement or tenderness.  Lungs:  Clear to auscultation bilaterally. Normal respiratory effort.  Heart:  Regular rate and rhythm without murmurs or gallops noted. Normal S1,S2.   Abdomen:  abdomen is soft, nontender.  no hepatosplenomegaly.   not distended.  no hernia  Rectal:  normal external and internal exam.  heme neg  Prostate:  Normal size prostate without masses or tenderness.  Msk:  muscle bulk and strength are grossly normal.  no obvious joint swelling.  gait is normal and steady  Pulses:  dorsalis pedis intact bilat.  no carotid bruit Extremities:  no deformity.  no ulcer on the feet.  feet are of normal color and temp.  no edema  Neurologic:  sensation is  intact to touch on the feet cn 2-12 grossly intact.   readily moves all 4's.     Skin:  normal texture and temp.  no rash.  not diaphoretic  Cervical Nodes:  No significant adenopathy.  Psych:  Alert and cooperative; normal mood and affect; normal attention span and concentration.     Impression & Recommendations:  Problem # 1:  ROUTINE GENERAL MEDICAL EXAM@HEALTH  CARE FACL (ICD-V70.0)  Problem # 2:  DIABETES MELLITUS, TYPE II (ICD-250.00) needs increased rx  Problem # 3:  ANEMIA, IRON  DEFICIENCY (ICD-280.9) needs increased rx  Medications Added to Medication List This Visit: 1)  Glucophage 1000 Mg Tabs (Metformin hcl) .... Take 1 by mouth two times a day  Other Orders: EKG w/ Interpretation (93000) Est. Patient 40-64 years (16109)   Patient Instructions: 1)  increase iron to 2 pills/day. 2)  increase metformin to 1000 mg two times a day. 3)  please consider these measures for your health:  minimize alcohol.  do not use tobacco products.  have a colonoscopy at least every 10 years from age 27.  keep firearms safely stored.  always use seat belts.  have working smoke alarms in your home.  see an eye doctor and dentist regularly.  never drive under the influence of alcohol or drugs (including prescription drugs).   4)  please let me know what your wishes would be, if artificial life support measures should become necessary.  it is critically important to prevent falling down (keep floor areas well-lit, dry, and free of loose objects). 5)  good diet and exercise habits significanly improve the control of your diabetes.  please let me know if you wish to be referred to a dietician.  high blood sugar is very risky to your health.  you should see an eye doctor every year. 6)  controlling your blood pressure and cholesterol drastically reduces the damage diabetes does to your body.  this also applies to quitting smoking.  please discuss these with your doctor.  you should take an aspirin every day, unless you have been advised by a doctor not to. 7)  Please schedule a follow-up appointment in 6 months. Prescriptions: GLUCOPHAGE 1000 MG  TABS (METFORMIN HCL) take 1 by mouth two times a day  #60 x 11   Entered and Authorized by:   Minus Breeding MD   Signed by:   Minus Breeding MD on 05/18/2010   Method used:   Electronically to        [facility] [phone]* (retail)       [address]       [place], [place]  [address]       Ph: [phone] or [phone]       Fax: [phone]    RxID:   [id number]    Orders Added: 1)  EKG w/ Interpretation [93000] 2)  Est. Patient 40-64 years [phone]

## 2010-07-08 LAB — GLUCOSE, CAPILLARY: Glucose-Capillary: 120 mg/dL — ABNORMAL HIGH (ref 70–99)

## 2010-08-13 NOTE — Op Note (Signed)
[place]  Patient:    Mitchell Alvarado                      MRN: [id number] Proc. Date: 03/11/99 Adm. Date:  [id number] Attending:  Thermon Leyland CC:         Justine Null, M.D. [facility]                           Operative Report  PREOPERATIVE DIAGNOSIS: 1. Benign prostatic hypertrophy. 2. Bladder neck obstruction. 3. Urinary retention.  POSTOPERATIVE DIAGNOSIS: 1. Benign prostatic hypertrophy. 2. Bladder neck obstruction. 3. Urinary retention.  OPERATION PERFORMED:  Transurethral resection of the prostate.  SURGEON:  Barron Alvine, M.D.  ANESTHESIA:  General.  INDICATIONS FOR PROCEDURE:  Mr. Quintela has been undergoing evaluation for some longstanding progressive voiding symptoms with significant hesitancy and a weak  stream as well as a sensation of incomplete emptying.  We performed further evaluation.  We attempted to get a flow rate, but his flow rate was so small and slow that it was difficult for the computer to register his flow rate.  We then  performed a post void residual by ultrasound and it was markedly elevated.  A catheter was placed and he had 1400 cc of post void residual.  Cystoscopy revealed relatively small lateral lobes but a very prominent middle lobe that was ball-valving right into his prostatic urethra and causing significant visual obstruction.  The bladder was thickened with trabeculation.  We felt that the patient did have significant outlet obstruction from BPH.  We felt that his bladder may have been damaged significantly and was probably hypocontractile and atonic, given the extremely large post void residual.  His creatinine was normal.  We discussed options with him. Overall, I did not feel his prostate was terribly large and that he was a candidate for endoscopic resection.  He was not really a candidate for microwave thermotherapy and while a TUNA was possible, I did not think that was the  best option.  We felt he was a good candidate for resection f his prostate in a traditional manner.  We decided we would not be aggressive in the apex or on the lateral lobes to try to minimize complications but to remove the  middle lobe as aggressively as possible.  He understood the advantages and disadvantages as well as all the potential risks of TURP.  DESCRIPTION OF PROCEDURE:  The patient was brought to the operating room where e had successful induction of general anesthesia.  He was placed in lithotomy position and prepped and draped in the usual manner.  Cystoscopy was performed nd some representative pictures were taken.  We then performed a transurethral resection utilizing a 27 French continuous flow resectoscope.  Glycine was used as an irrigant.  We started the resection by taking down the middle lobe after identification of the ureteral orifices.  The middle lobe was taken down to the  capsular fibers of the bladder neck.  We then resected some of the lateral lobe  tissue especially near the bladder neck.  We purposely left some apical tissue nd tried not to resect overly aggressively along the lateral lobes to minimize any  chance of nerve injury.  Hemostasis remained quite good and there was really minimal bleeding.  Ureteral orifices were identified throughout the procedure.  Approximately 15 to 20 gm of TUR tissue were resected and evacuated from  the bladder and sent for pathologic analysis.  At the end of the procedure, a 24 [place] 3-way Foley catheter was placed and the urine was quite clear on travel to recovery room.  No obvious complications occurred. DD:  03/11/99 TD:  03/12/99 Job: [id number]

## 2010-08-30 ENCOUNTER — Other Ambulatory Visit: Payer: Self-pay | Admitting: Emergency Medicine

## 2010-08-30 DIAGNOSIS — R9389 Abnormal findings on diagnostic imaging of other specified body structures: Secondary | ICD-10-CM

## 2010-08-31 ENCOUNTER — Ambulatory Visit
Admission: RE | Admit: 2010-08-31 | Discharge: 2010-08-31 | Disposition: A | Discharge status: Home / Self Care | Payer: 59 | Source: Ambulatory Visit | Attending: Emergency Medicine | Admitting: Emergency Medicine

## 2010-08-31 DIAGNOSIS — R9389 Abnormal findings on diagnostic imaging of other specified body structures: Secondary | ICD-10-CM

## 2010-10-04 ENCOUNTER — Other Ambulatory Visit: Payer: Self-pay | Admitting: Endocrinology

## 2011-03-30 ENCOUNTER — Telehealth: Payer: Self-pay | Admitting: *Deleted

## 2011-03-30 DIAGNOSIS — Z0389 Encounter for observation for other suspected diseases and conditions ruled out: Secondary | ICD-10-CM

## 2011-03-30 DIAGNOSIS — Z0000 Encounter for general adult medical examination without abnormal findings: Secondary | ICD-10-CM

## 2011-03-30 DIAGNOSIS — E119 Type 2 diabetes mellitus without complications: Secondary | ICD-10-CM

## 2011-03-30 NOTE — Telephone Encounter (Signed)
Labs placed into Epic for upcoming CPX appointment.  

## 2011-04-17 ENCOUNTER — Other Ambulatory Visit: Payer: Self-pay | Admitting: Endocrinology

## 2011-05-12 ENCOUNTER — Other Ambulatory Visit (INDEPENDENT_AMBULATORY_CARE_PROVIDER_SITE_OTHER): Payer: 59

## 2011-05-12 DIAGNOSIS — Z0389 Encounter for observation for other suspected diseases and conditions ruled out: Secondary | ICD-10-CM

## 2011-05-12 DIAGNOSIS — E119 Type 2 diabetes mellitus without complications: Secondary | ICD-10-CM

## 2011-05-12 DIAGNOSIS — Z0000 Encounter for general adult medical examination without abnormal findings: Secondary | ICD-10-CM

## 2011-05-12 LAB — BASIC METABOLIC PANEL
BUN: 20 mg/dL (ref 6–23)
CO2: 24 mEq/L (ref 19–32)
Chloride: 108 mEq/L (ref 96–112)
GFR: 64.66 mL/min (ref >60.00)
Glucose, Bld: 107 mg/dL — ABNORMAL HIGH (ref 70–99)
Potassium: 5.3 mEq/L — ABNORMAL HIGH (ref 3.5–5.1)

## 2011-05-12 LAB — URINALYSIS, ROUTINE W REFLEX MICROSCOPIC
Bilirubin Urine: NEGATIVE
Leukocytes, UA: NEGATIVE
Nitrite: NEGATIVE
Urobilinogen, UA: 0.2 (ref 0.0–1.0)

## 2011-05-12 LAB — CBC WITH DIFFERENTIAL/PLATELET
Basophils Relative: 1.1 % (ref 0.0–3.0)
Eosinophils Relative: 2.9 % (ref 0.0–5.0)
MCV: 98.2 fl (ref 78.0–100.0)
Monocytes Absolute: 0.4 10*3/uL (ref 0.1–1.0)
Monocytes Relative: 8 % (ref 3.0–12.0)
Neutrophils Relative %: 53.7 % (ref 43.0–77.0)
RBC: 3.28 Mil/uL — ABNORMAL LOW (ref 4.22–5.81)
WBC: 4.6 10*3/uL (ref 4.5–10.5)

## 2011-05-12 LAB — HEMOGLOBIN A1C: Hgb A1c MFr Bld: 6.6 % — ABNORMAL HIGH (ref 4.6–6.5)

## 2011-05-12 LAB — LIPID PANEL
HDL: 101.9 mg/dL (ref >39.00)
LDL Cholesterol: 34 mg/dL (ref 0–99)
VLDL: 18 mg/dL (ref 0.0–40.0)

## 2011-05-12 LAB — HEPATIC FUNCTION PANEL
ALT: 18 U/L (ref 0–53)
AST: 18 U/L (ref 0–37)
Albumin: 3.9 g/dL (ref 3.5–5.2)
Total Protein: 6.8 g/dL (ref 6.0–8.3)

## 2011-05-12 LAB — MICROALBUMIN / CREATININE URINE RATIO: Microalb, Ur: 6 mg/dL — ABNORMAL HIGH (ref 0.0–1.9)

## 2011-05-20 ENCOUNTER — Ambulatory Visit (INDEPENDENT_AMBULATORY_CARE_PROVIDER_SITE_OTHER): Payer: 59 | Admitting: Endocrinology

## 2011-05-20 ENCOUNTER — Encounter: Payer: Self-pay | Admitting: *Deleted

## 2011-05-20 ENCOUNTER — Other Ambulatory Visit (INDEPENDENT_AMBULATORY_CARE_PROVIDER_SITE_OTHER): Payer: 59

## 2011-05-20 VITALS — BP 132/62 | HR 81 | Temp 98.6°F | Ht 75.0 in | Wt 259.2 lb

## 2011-05-20 DIAGNOSIS — E119 Type 2 diabetes mellitus without complications: Secondary | ICD-10-CM

## 2011-05-20 DIAGNOSIS — D509 Iron deficiency anemia, unspecified: Secondary | ICD-10-CM

## 2011-05-20 DIAGNOSIS — E875 Hyperkalemia: Secondary | ICD-10-CM | POA: Insufficient documentation

## 2011-05-20 LAB — BASIC METABOLIC PANEL
BUN: 20 mg/dL (ref 6–23)
CO2: 24 mEq/L (ref 19–32)
Chloride: 105 mEq/L (ref 96–112)
Creatinine, Ser: 1.6 mg/dL — ABNORMAL HIGH (ref 0.4–1.5)
Potassium: 4.9 mEq/L (ref 3.5–5.1)

## 2011-05-20 LAB — IBC PANEL
Iron: 87 ug/dL (ref 42–165)
Transferrin: 251.1 mg/dL (ref 212.0–360.0)

## 2011-05-20 NOTE — Progress Notes (Signed)
Subjective:    Patient ID: Mitchell Alvarado, male    DOB: 07/10/1950, 61 y.o.   MRN: [id number]  HPI here for regular wellness examination.  He's feeling pretty well in general, and says chronic med probs are stable, except as noted below Past Medical History  Diagnosis Date  . ANEMIA, IRON DEFICIENCY 12/23/2009  . DIABETES MELLITUS, TYPE II 12/30/2006  . ECZEMA 05/15/2009  . GERD 12/30/2006  . HYPERLIPIDEMIA 12/30/2006  . HYPERTENSION 12/30/2006  . HYPERTHYROIDISM 06/03/2008  . Pernicious anemia 12/30/2006    Past Surgical History  Procedure Date  . Transurethral resection of prostate     History   Social History  . Marital Status: Married    Spouse Name: N/A    Number of Children: N/A  . Years of Education: N/A   Occupational History  . Retired from school system    Social History Main Topics  . Smoking status: Former Games developer  . Smokeless tobacco: Not on file  . Alcohol Use: 6.0 oz/week    10 Glasses of wine per week  . Drug Use: Not on file  . Sexually Active: Not on file   Other Topics Concern  . Not on file   Social History Narrative  . No narrative on file    Current Outpatient Prescriptions on File Prior to Visit  Medication Sig Dispense Refill  . carvedilol (COREG) 3.125 MG tablet TAKE 1 TABLET TWICE A DAY  60 tablet  2  . cyanocobalamin (,VITAMIN B-12,) 1000 MCG/ML injection INJECT INTRAMUSCULARY ONCE A MONTH  1 mL  6  . lisinopril-hydrochlorothiazide (PRINZIDE,ZESTORETIC) 20-12.5 MG per tablet TAKE 2 TABLETS BY MOUTH EVERY DAY  60 tablet  2  . ONGLYZA 5 MG TABS tablet TAKE 1 TABLET BY MOUTH EVERY MORNING  30 tablet  2  . pioglitazone (ACTOS) 45 MG tablet TAKE 1 TABLET BY MOUTH EVERY DAY  30 tablet  2  . VYTORIN 10-80 MG per tablet TAKE 1 TABLET BY MOUTH EVERY DAY  30 tablet  2    No Known Allergies  Family History  Problem Relation Age of Onset  . Heart attack Father     MI  . Depression Son   . COPD Son   . Cancer Neg Hx     BP 132/62  Pulse  81  Temp(Src) 98.6 F (37 C) (Oral)  Ht 6\' 3"  (1.905 m)  Wt 259 lb 3.2 oz (117.572 kg)  BMI 32.40 kg/m2  SpO2 96%     Review of Systems  Constitutional: Negative for fever.  HENT: Negative for hearing loss.   Eyes: Negative for visual disturbance.  Respiratory: Negative for shortness of breath.   Cardiovascular: Negative for chest pain.  Gastrointestinal: Negative for anal bleeding.  Genitourinary: Negative for hematuria and difficulty urinating.  Musculoskeletal: Negative for back pain.  Skin: Negative for rash.  Neurological: Negative for syncope and headaches.  Hematological: Does not bruise/bleed easily.  Psychiatric/Behavioral: Negative for dysphoric mood.       Objective:   Physical Exam VS: see vs page GEN: no distress.  obese HEAD: head: no deformity eyes: no periorbital swelling, no proptosis external nose and ears are normal mouth: no lesion seen NECK: supple, thyroid is not enlarged CHEST WALL: no deformity LUNGS: clear to auscultation BREASTS:  No gynecomastia CV: reg rate and rhythm, no murmur ABD: abdomen is soft, nontender.  no hepatosplenomegaly.  not distended.  no hernia.   RECTAL: normal external and internal exam.  heme neg. PROSTATE:  Normal size.  No nodule MUSCULOSKELETAL: muscle bulk and strength are grossly normal.  no obvious joint swelling.  gait is normal and steady EXTEMITIES: no deformity.  no ulcer on the feet.  feet are of normal color and temp.  no edema PULSES: dorsalis pedis intact bilat.  no carotid bruit NEURO:  cn 2-12 grossly intact.   readily moves all 4's.  sensation is intact to touch on the feet SKIN:  Normal texture and temperature.  No rash or suspicious lesion is visible.   NODES:  None palpable at the neck PSYCH: alert, oriented x3.  Does not appear anxious nor depressed.      Assessment & Plan:  Wellness visit today, with problems stable, except as noted.

## 2011-05-20 NOTE — Patient Instructions (Addendum)
Please come back for a follow-up appointment in 6 months.  blood tests are being requested for you today.  please call [phone] to hear your test results.  You will be prompted to enter the 9-digit "MRN" number that appears at the top left of this page, followed by #.  Then you will hear the message. (you may need more iron) please consider these measures for your health:  minimize alcohol.  do not use tobacco products.  have a colonoscopy at least every 10 years from age 75.  keep firearms safely stored.  always use seat belts.  have working smoke alarms in your home.  see an eye doctor and dentist regularly.  never drive under the influence of alcohol or drugs (including prescription drugs).   please let me know what your wishes would be, if artificial life support measures should become necessary.  it is critically important to prevent falling down (keep floor areas well-lit, dry, and free of loose objects.  If you have a cane, walker, or wheelchair, you should use it, even for short trips around the house.  Also, try not to rush) (update: i left message on phone-tree:  Potassium is better.  We'll follow creat

## 2011-05-22 ENCOUNTER — Encounter: Payer: Self-pay | Admitting: Internal Medicine

## 2011-05-23 ENCOUNTER — Other Ambulatory Visit: Payer: Self-pay | Admitting: *Deleted

## 2011-05-23 ENCOUNTER — Other Ambulatory Visit: Payer: Self-pay | Admitting: Endocrinology

## 2011-05-23 MED ORDER — CARVEDILOL 3.125 MG PO TABS: ORAL_TABLET | ORAL | Status: DC

## 2011-05-23 MED ORDER — METFORMIN HCL 1000 MG PO TABS: 1000.0000 mg | ORAL_TABLET | Freq: Two times a day (BID) | ORAL | Status: DC

## 2011-05-23 MED ORDER — OMEPRAZOLE MAGNESIUM 20 MG PO TBEC: 20.0000 mg | DELAYED_RELEASE_TABLET | Freq: Every day | ORAL | Status: DC

## 2011-05-23 MED ORDER — PIOGLITAZONE HCL 45 MG PO TABS: ORAL_TABLET | ORAL | Status: DC

## 2011-05-23 MED ORDER — LISINOPRIL-HYDROCHLOROTHIAZIDE 20-12.5 MG PO TABS: ORAL_TABLET | ORAL | Status: DC

## 2011-05-23 MED ORDER — SAXAGLIPTIN HCL 5 MG PO TABS: ORAL_TABLET | ORAL | Status: DC

## 2011-05-23 MED ORDER — CYANOCOBALAMIN 1000 MCG/ML IJ SOLN: INTRAMUSCULAR | Status: DC

## 2011-05-23 MED ORDER — EZETIMIBE-SIMVASTATIN 10-80 MG PO TABS: ORAL_TABLET | ORAL | Status: DC

## 2011-05-23 NOTE — Telephone Encounter (Signed)
Pt needs refills of all meds sent to [facility] on [address]. Rx sent, pt informed.

## 2011-05-24 ENCOUNTER — Encounter: Payer: Self-pay | Admitting: Internal Medicine

## 2011-09-05 ENCOUNTER — Emergency Department (HOSPITAL_BASED_OUTPATIENT_CLINIC_OR_DEPARTMENT_OTHER): Payer: 59

## 2011-09-05 ENCOUNTER — Encounter (HOSPITAL_BASED_OUTPATIENT_CLINIC_OR_DEPARTMENT_OTHER): Payer: Self-pay | Admitting: *Deleted

## 2011-09-05 ENCOUNTER — Emergency Department (HOSPITAL_BASED_OUTPATIENT_CLINIC_OR_DEPARTMENT_OTHER)
Admission: EM | Admit: 2011-09-05 | Discharge: 2011-09-05 | Disposition: A | Discharge status: Home / Self Care | Payer: 59 | Attending: Emergency Medicine | Admitting: Emergency Medicine

## 2011-09-05 DIAGNOSIS — I10 Essential (primary) hypertension: Secondary | ICD-10-CM | POA: Insufficient documentation

## 2011-09-05 DIAGNOSIS — E119 Type 2 diabetes mellitus without complications: Secondary | ICD-10-CM | POA: Insufficient documentation

## 2011-09-05 DIAGNOSIS — E785 Hyperlipidemia, unspecified: Secondary | ICD-10-CM | POA: Insufficient documentation

## 2011-09-05 DIAGNOSIS — R51 Headache: Secondary | ICD-10-CM | POA: Insufficient documentation

## 2011-09-05 NOTE — Discharge Instructions (Signed)
. Please read and follow all provided instructions.  Your diagnoses today include:  1. Headache     Tests performed today include:  CT of your head which was normal and did not show any serious cause of your headache  Vital signs. See below for your results today.   Medications:   None  Additional information:  Follow any educational materials contained in this packet.  You are having a headache. No specific cause was found today for your headache. It may have been a migraine or other cause of headache. Stress, anxiety, fatigue, and depression are common triggers for headaches.  Your headache today does not appear to be life-threatening or require hospitalization, but often the exact cause of headaches is not determined in the emergency department. Therefore, follow-up with your doctor is very important to find out what may have caused your headache and whether or not you need any further diagnostic testing or treatment.   Sometimes headaches can appear benign (not harmful), but then more serious symptoms can develop which should prompt an immediate re-evaluation by your doctor or the emergency department.  BE VERY CAREFUL not to take multiple medicines containing Tylenol (also called acetaminophen). Doing so can lead to an overdose which can damage your liver and cause liver failure and possibly death.   Follow-up instructions: Please follow-up with your primary care provider in the next 3 days for further evaluation of your symptoms. If you do not have a primary care doctor -- see below for referral information.   Use over-the-counter acetaminophen (Tylenol) as directed on the packaging for your headache.   Return instructions:   Please return to the Emergency Department if you experience worsening symptoms.  Return if the medications do not resolve your headache, if it recurs, or if you have multiple episodes of vomiting or cannot keep down fluids.  Return if you have a  change from the usual headache.  RETURN IMMEDIATELY IF you:  Develop a sudden, severe headache  Develop confusion or become poorly responsive or faint  Develop a fever above 100.23F or problem breathing  Have a change in speech, vision, swallowing, or understanding  Develop new weakness, numbness, tingling, incoordination in your arms or legs  Have a seizure  Please return if you have any other emergent concerns.  Additional Information:  Your vital signs today were: BP 140/70  Pulse 103  Temp(Src) 98.1 F (36.7 C) (Oral)  Resp 18  SpO2 99% If your blood pressure (BP) was elevated above 135/85 this visit, please have this repeated by your doctor within one month. -------------- No Primary Care Doctor Call [facility]  [phone] Other agencies that provide inexpensive medical care    [facility]  [phone]    [facility]  [phone]    [facility]  [phone]    [facility]  [phone]    [facility]  [phone]    [facility]  [phone] -------------- RESOURCE GUIDE:  Dental Problems  Patients with Medicaid: [facility] [phone] [address]                                            [phone] [address] Phone:  [phone]  Phone:  [phone]  If unable to pay or uninsured, contact:  [facility] or [facility]. to become qualified for the adult dental clinic.  Chronic Pain Problems Contact [facility]  [phone] Patients need to be referred by their primary care doctor.  Insufficient Money for Medicine Contact [employer]:  call "211" or [facility] [phone].  Psychological Services [facility]  [phone] [facility]  [phone] [facility]   [phone] (emergency services [phone])  Substance Abuse Resources [facility] Drug  Services  [phone] [facility] [phone] [facility] [phone] Floydene Flock [phone] Residential & Outpatient Substance Abuse Program  [phone]  Abuse/Neglect [facility] Child Abuse Hotline [phone] [facility] Child Abuse Hotline [phone] (After Hours)  Emergency Shelter [facility] [phone]  M[facility] Homes [facility] [phone] [place] [facility] [phone]  [facility] Resources  [facility]     [employer]                          [facility] Dept. [address] [place]                       [address]      [address]  Blondell Reveal Phone:  [phone]                                   Phone:  [phone]                 Phone:  [phone]  [facility] Phone:  [phone]  [facility] Child Abuse Hotline 343-848-8841 [phone] (After Hours)

## 2011-09-05 NOTE — ED Provider Notes (Signed)
History     CSN: [id number]  Arrival date & time 09/05/11  1635   First MD Initiated Contact with Patient 09/05/11 1642      Chief Complaint  Patient presents with  . Headache    (Consider location/radiation/quality/duration/timing/severity/associated sxs/prior treatment) HPI Comments: Patient presents with complaint of headache for the past 6 days. Headache is described as dull, left orbital and left temporal, throbbing. It is intermittent. Patient notes that it hurts more when he stands up or is walking. He states that sitting and resting makes it better. He has not had headaches like this before. He tried sinus decongestants for the pain which helped somewhat however he denies having runny nose or sinus congestion. He denies fever, neck pain. He denies photophobia or phonophobia. He has not had a headache like this in the past. Patient is concerned that he has had a stroke or an aneurysm. He does have history of diabetes, high blood pressure, high cholesterol.  Patient is a 61 y.o. male presenting with headaches. The history is provided by the patient.  Headache  This is a new problem. The current episode started more than 2 days ago. The problem occurs every few minutes. The problem has been gradually worsening. Associated with: standing and walking. The pain is located in the left unilateral region. The quality of the pain is described as dull. The pain is mild. The pain does not radiate. Pertinent negatives include no fever, no chest pressure, no near-syncope, no syncope, no shortness of breath, no nausea and no vomiting.    Past Medical History  Diagnosis Date  . ANEMIA, IRON DEFICIENCY 12/23/2009  . DIABETES MELLITUS, TYPE II 12/30/2006  . ECZEMA 05/15/2009  . GERD 12/30/2006  . HYPERLIPIDEMIA 12/30/2006  . HYPERTENSION 12/30/2006  . HYPERTHYROIDISM 06/03/2008  . Pernicious anemia 12/30/2006  . Personal history of colonic polyps - adenoma 07/22/2004    Past Surgical History    Procedure Date  . Transurethral resection of prostate   . Esophagogastroduodenoscopy   . Colonoscopy     Family History  Problem Relation Age of Onset  . Heart attack Father     MI  . Depression Son   . COPD Son   . Cancer Neg Hx     History  Substance Use Topics  . Smoking status: Former Games developer  . Smokeless tobacco: Not on file  . Alcohol Use: 6.0 oz/week    10 Glasses of wine per week      Review of Systems  Constitutional: Negative for fever.  HENT: Negative for sore throat, rhinorrhea, neck pain, neck stiffness and sinus pressure.   Eyes: Negative for redness.  Respiratory: Negative for cough and shortness of breath.   Cardiovascular: Negative for chest pain, syncope and near-syncope.  Gastrointestinal: Negative for nausea, vomiting, abdominal pain and diarrhea.  Genitourinary: Negative for dysuria.  Musculoskeletal: Negative for myalgias.  Skin: Negative for rash.  Neurological: Positive for headaches. Negative for syncope, facial asymmetry, speech difficulty, weakness and numbness.    Allergies  Review of patient's allergies indicates no known allergies.  Home Medications   Current Outpatient Rx  Name Route Sig Dispense Refill  . ASPIRIN 81 MG PO TABS Oral Take 81 mg by mouth daily.    Marland Kitchen BIMATOPROST 0.01 % OP SOLN Both Eyes Place 1 drop into both eyes at bedtime.    Marland Kitchen CARVEDILOL 3.125 MG PO TABS  TAKE 1 TABLET TWICE A DAY 60 tablet 11  . CYANOCOBALAMIN 1000 MCG/ML IJ SOLN  INJECT INTRAMUSCULARY ONCE A MONTH 1 mL 6  . EZETIMIBE-SIMVASTATIN 10-80 MG PO TABS  TAKE 1 TABLET BY MOUTH EVERY DAY 30 tablet 11  . FERROUS SULFATE 325 (65 FE) MG PO TABS Oral Take 325 mg by mouth daily with breakfast.    . LISINOPRIL-HYDROCHLOROTHIAZIDE 20-12.5 MG PO TABS  TAKE 2 TABLETS BY MOUTH EVERY DAY 60 tablet 11  . METFORMIN HCL 1000 MG PO TABS Oral Take 1 tablet (1,000 mg total) by mouth 2 (two) times daily. 60 tablet 11  . OMEPRAZOLE MAGNESIUM 20 MG PO TBEC Oral Take 1  tablet (20 mg total) by mouth daily. 30 tablet 11  . PHENYLEPHRINE-APAP-GUAIFENESIN 5-325-200 MG PO TABS Oral Take 2 tablets by mouth every 4 (four) hours as needed. For sinus pressure and headache    . PIOGLITAZONE HCL 45 MG PO TABS  TAKE 1 TABLET BY MOUTH EVERY DAY 30 tablet 11  . SAXAGLIPTIN HCL 5 MG PO TABS  TAKE 1 TABLET BY MOUTH EVERY MORNING 30 tablet 11    BP 140/70  Pulse 103  Temp(Src) 98.1 F (36.7 C) (Oral)  Resp 18  SpO2 99%  Physical Exam  Nursing note and vitals reviewed. Constitutional: He is oriented to person, place, and time. He appears well-developed and well-nourished.  HENT:  Head: Normocephalic and atraumatic.  Right Ear: Tympanic membrane, external ear and ear canal normal.  Left Ear: Tympanic membrane, external ear and ear canal normal.  Nose: Nose normal. No mucosal edema. Right sinus exhibits no maxillary sinus tenderness and no frontal sinus tenderness. Left sinus exhibits no maxillary sinus tenderness and no frontal sinus tenderness.  Mouth/Throat: Uvula is midline, oropharynx is clear and moist and mucous membranes are normal.  Eyes: Conjunctivae are normal. Right eye exhibits no discharge. Left eye exhibits no discharge.  Neck: Normal range of motion. Neck supple.  Cardiovascular: Normal rate, regular rhythm and normal heart sounds.   Pulmonary/Chest: Effort normal and breath sounds normal.  Abdominal: Soft. There is no tenderness.  Neurological: He is alert and oriented to person, place, and time. He has normal strength. No cranial nerve deficit or sensory deficit. Coordination and gait normal. GCS eye subscore is 4. GCS verbal subscore is 5. GCS motor subscore is 6.  Skin: Skin is warm and dry.  Psychiatric: He has a normal mood and affect.    ED Course  Procedures (including critical care time)  Labs Reviewed - No data to display Ct Head Wo Contrast  09/05/2011  *RADIOLOGY REPORT*  Clinical Data: Headache over the left eye and forehead for 6  days.  CT HEAD WITHOUT CONTRAST  Technique:  Contiguous axial images were obtained from the base of the skull through the vertex without contrast.  Comparison: None.  Findings: There is no acute intracranial hemorrhage, infarction, or mass lesion. This is 7 mm lucency in the right frontal white matter on image number 23 of series 2 which may represent a tiny area of white matter small vessel ischemic disease.  The appearance is not typical for a mass.  Osseous structures are normal including the paranasal sinuses. Orbits appear normal.  IMPRESSION: No significant abnormalities.  Original Report Authenticated By: Gwynn Burly, M.D.     1. Headache     5:11 PM Patient seen and examined. Work-up initiated.   Vital signs reviewed and are as follows: Filed Vitals:   09/05/11 1639  BP: 140/70  Pulse: 103  Temp: 98.1 F (36.7 C)  Resp: 18   Patient  informed of CT findings. Patient urged to continue sinus medication if it is helping. Urged to use over-the-counter medications as directed on the packaging as needed. Urged patient to followup with his primary care provider for further evaluation of his headache. Patient verbalizes understanding and is in agreement with the plan.   MDM  Patient with new-onset headache. Uncertain etiology. Do not suspect brain tumor or bleeding the CT is negative. Pain is not thunderclap onset. It is not the worst headache of his life. Do not suspect patient is having sentinel bleeding. There is no fever or neck pain consistent with meningitis. No history of head injury. Patient appears well at time of discharge. He has appropriate followup.        Renne Crigler, [place] 09/05/11 2308

## 2011-09-05 NOTE — ED Notes (Signed)
Headache x 6 days 

## 2011-09-05 NOTE — ED Provider Notes (Signed)
Medical screening examination/treatment/procedure(s) were performed by non-physician practitioner and as supervising physician I was immediately available for consultation/collaboration.   Dick Hark Y. Vane Yapp, MD 09/05/11 2350 

## 2011-12-08 ENCOUNTER — Other Ambulatory Visit: Payer: Self-pay | Admitting: Endocrinology

## 2012-01-20 ENCOUNTER — Encounter: Payer: Self-pay | Admitting: Internal Medicine

## 2012-03-30 ENCOUNTER — Telehealth: Payer: Self-pay | Admitting: Endocrinology

## 2012-03-30 DIAGNOSIS — Z0389 Encounter for observation for other suspected diseases and conditions ruled out: Secondary | ICD-10-CM

## 2012-03-30 DIAGNOSIS — Z0000 Encounter for general adult medical examination without abnormal findings: Secondary | ICD-10-CM

## 2012-03-30 DIAGNOSIS — D509 Iron deficiency anemia, unspecified: Secondary | ICD-10-CM

## 2012-03-30 DIAGNOSIS — E109 Type 1 diabetes mellitus without complications: Secondary | ICD-10-CM

## 2012-03-30 NOTE — Telephone Encounter (Signed)
The patient has a cpe scheduled for 05/21/12 with Dr. Everardo All and stated he needs labs done 1 week prior.  Is it possible to send lab order downstairs for this patient for the week of February 17th 2014?

## 2012-04-02 NOTE — Telephone Encounter (Signed)
V70.0 for cpx labs 250.01 for a1c and microalbumin 280.9 for ibc panel

## 2012-04-02 NOTE — Telephone Encounter (Signed)
LAB ORDERS DONE IN EPIC FOR [facility]. PATIENT NOTIFIED TO PICK UP AT FRONT DESK PER DR. ELLISON.

## 2012-04-02 NOTE — Telephone Encounter (Signed)
Ok.  Please order cpx labs, a1c urine microalbumin, and ibc panel

## 2012-05-14 ENCOUNTER — Other Ambulatory Visit (INDEPENDENT_AMBULATORY_CARE_PROVIDER_SITE_OTHER): Payer: 59

## 2012-05-14 ENCOUNTER — Telehealth: Payer: Self-pay | Admitting: Endocrinology

## 2012-05-14 ENCOUNTER — Other Ambulatory Visit: Payer: Self-pay

## 2012-05-14 DIAGNOSIS — Z0389 Encounter for observation for other suspected diseases and conditions ruled out: Secondary | ICD-10-CM

## 2012-05-14 DIAGNOSIS — E119 Type 2 diabetes mellitus without complications: Secondary | ICD-10-CM

## 2012-05-14 DIAGNOSIS — D509 Iron deficiency anemia, unspecified: Secondary | ICD-10-CM

## 2012-05-14 DIAGNOSIS — Z0000 Encounter for general adult medical examination without abnormal findings: Secondary | ICD-10-CM

## 2012-05-14 DIAGNOSIS — E109 Type 1 diabetes mellitus without complications: Secondary | ICD-10-CM

## 2012-05-14 LAB — BASIC METABOLIC PANEL
BUN: 26 mg/dL — ABNORMAL HIGH (ref 6–23)
CO2: 25 mEq/L (ref 19–32)
Calcium: 9.4 mg/dL (ref 8.4–10.5)
Creatinine, Ser: 1.9 mg/dL — ABNORMAL HIGH (ref 0.4–1.5)

## 2012-05-14 LAB — CBC WITH DIFFERENTIAL/PLATELET
Basophils Absolute: 0 10*3/uL (ref 0.0–0.1)
Basophils Relative: 0.9 % (ref 0.0–3.0)
Hemoglobin: 11.1 g/dL — ABNORMAL LOW (ref 13.0–17.0)
Lymphocytes Relative: 43.1 % (ref 12.0–46.0)
Monocytes Relative: 9.1 % (ref 3.0–12.0)
Neutro Abs: 1.8 10*3/uL (ref 1.4–7.7)
Neutrophils Relative %: 43.9 % (ref 43.0–77.0)
RBC: 3.47 Mil/uL — ABNORMAL LOW (ref 4.22–5.81)

## 2012-05-14 LAB — URINALYSIS, ROUTINE W REFLEX MICROSCOPIC
Hgb urine dipstick: NEGATIVE
Leukocytes, UA: NEGATIVE
Nitrite: NEGATIVE
Specific Gravity, Urine: 1.025 (ref 1.000–1.030)
Urine Glucose: NEGATIVE
Urobilinogen, UA: 0.2 (ref 0.0–1.0)

## 2012-05-14 LAB — LIPID PANEL
Cholesterol: 172 mg/dL (ref 0–200)
LDL Cholesterol: 43 mg/dL (ref 0–99)
Total CHOL/HDL Ratio: 2
VLDL: 19.6 mg/dL (ref 0.0–40.0)

## 2012-05-14 LAB — HEPATIC FUNCTION PANEL
AST: 23 U/L (ref 0–37)
Albumin: 4.3 g/dL (ref 3.5–5.2)
Alkaline Phosphatase: 40 U/L (ref 39–117)
Bilirubin, Direct: 0.1 mg/dL (ref 0.0–0.3)

## 2012-05-14 LAB — IBC PANEL
Iron: 110 ug/dL (ref 42–165)
Transferrin: 272.9 mg/dL (ref 212.0–360.0)

## 2012-05-14 NOTE — Telephone Encounter (Signed)
Please call patient. He has an upcoming CPE w/ Dr. Jeanne Ivan. He went to try to have labs drawn but the lab needs an order first. Patient call back number is [phone]

## 2012-05-21 ENCOUNTER — Other Ambulatory Visit: Payer: Self-pay | Admitting: *Deleted

## 2012-05-21 ENCOUNTER — Ambulatory Visit (INDEPENDENT_AMBULATORY_CARE_PROVIDER_SITE_OTHER): Payer: 59 | Admitting: Endocrinology

## 2012-05-21 VITALS — BP 122/80 | HR 80 | Wt 251.0 lb

## 2012-05-21 MED ORDER — HYDROCHLOROTHIAZIDE 25 MG PO TABS: 25.0000 mg | ORAL_TABLET | Freq: Every day | ORAL | Status: DC

## 2012-05-21 MED ORDER — EZETIMIBE-SIMVASTATIN 10-80 MG PO TABS: ORAL_TABLET | ORAL | Status: DC

## 2012-05-21 MED ORDER — CARVEDILOL 3.125 MG PO TABS: ORAL_TABLET | ORAL | Status: DC

## 2012-05-21 MED ORDER — PIOGLITAZONE HCL 45 MG PO TABS: ORAL_TABLET | ORAL | Status: DC

## 2012-05-21 MED ORDER — SAXAGLIPTIN HCL 5 MG PO TABS: ORAL_TABLET | ORAL | Status: DC

## 2012-05-21 MED ORDER — BROMOCRIPTINE MESYLATE 2.5 MG PO TABS: 2.5000 mg | ORAL_TABLET | Freq: Every day | ORAL | Status: DC

## 2012-05-21 MED ORDER — CYANOCOBALAMIN 1000 MCG/ML IJ SOLN: INTRAMUSCULAR | Status: DC

## 2012-05-21 NOTE — Patient Instructions (Addendum)
Take 2 iron pills per day.   Refer to a kidney specialist.  you will receive a phone call, about a day and time for an appointment Change lisinopril to lisinopril-hctz.  i have sent a prescription to your pharmacy.   Change metformin to "bromocriptine."  It has possible side effects of nausea and dizziness.  These go away with time.  You can avoid these by taking it at bedtime, and by taking just take 1/2 pill for the first week.   Please come back for a follow-up appointment in 3 months.   please consider these measures for your health:  minimize alcohol.  do not use tobacco products.  have a colonoscopy at least every 10 years from age 35.  keep firearms safely stored.  always use seat belts.  have working smoke alarms in your home.  see an eye doctor and dentist regularly.  never drive under the influence of alcohol or drugs (including prescription drugs).

## 2012-05-21 NOTE — Progress Notes (Signed)
Subjective:    Patient ID: Mitchell Alvarado, male    DOB: 1950/09/19, 62 y.o.   MRN: [id number]  HPI here for regular wellness examination.  He's feeling pretty well in general, and says chronic med probs are stable, except as noted below Past Medical History  Diagnosis Date  . ANEMIA, IRON DEFICIENCY 12/23/2009  . DIABETES MELLITUS, TYPE II 12/30/2006  . ECZEMA 05/15/2009  . GERD 12/30/2006  . HYPERLIPIDEMIA 12/30/2006  . HYPERTENSION 12/30/2006  . HYPERTHYROIDISM 06/03/2008  . Pernicious anemia 12/30/2006  . Personal history of colonic polyps - adenoma 07/22/2004    Past Surgical History  Procedure Laterality Date  . Transurethral resection of prostate    . Esophagogastroduodenoscopy    . Colonoscopy      History   Social History  . Marital Status: Married    Spouse Name: N/A    Number of Children: N/A  . Years of Education: N/A   Occupational History  . Retired from school system    Social History Main Topics  . Smoking status: Former Games developer  . Smokeless tobacco: Not on file  . Alcohol Use: 6.0 oz/week    10 Glasses of wine per week  . Drug Use: Not on file  . Sexually Active: Not on file   Other Topics Concern  . Not on file   Social History Narrative  . No narrative on file    Current Outpatient Prescriptions on File Prior to Visit  Medication Sig Dispense Refill  . aspirin 81 MG tablet Take 81 mg by mouth daily.      . BD INTEGRA SYRINGE 25G X 1" 3 ML MISC USE 1 SYRINGE ONCE A MONTH AS DIRECTED  4 each  0  . bimatoprost (LUMIGAN) 0.01 % SOLN Place 1 drop into both eyes at bedtime.      . ferrous sulfate 325 (65 FE) MG tablet Take 325 mg by mouth daily with breakfast.      . omeprazole (PRILOSEC OTC) 20 MG tablet Take 1 tablet (20 mg total) by mouth daily.  30 tablet  11  . Phenylephrine-APAP-Guaifenesin (MUCINEX FAST-MAX COLD & SINUS) 5-325-200 MG TABS Take 2 tablets by mouth every 4 (four) hours as needed. For sinus pressure and headache       No current  facility-administered medications on file prior to visit.    No Known Allergies  Family History  Problem Relation Age of Onset  . Heart attack Father     MI  . Depression Son   . COPD Son   . Cancer Neg Hx     BP 122/80  Pulse 80  Wt 251 lb (113.853 kg)  BMI 31.37 kg/m2  SpO2 97%  Review of Systems  Constitutional: Negative for fever.  HENT: Negative for hearing loss.   Eyes: Negative for visual disturbance.  Cardiovascular: Negative for chest pain.  Gastrointestinal: Positive for blood in stool.  Endocrine: Negative for polyuria.  Musculoskeletal: Positive for back pain.  Skin:       excema  Allergic/Immunologic: Positive for environmental allergies.  Neurological: Negative for syncope.  Hematological: Does not bruise/bleed easily.  Psychiatric/Behavioral: Negative for decreased concentration.       Objective:   Physical Exam VS: see vs page GEN: no distress HEAD: head: no deformity eyes: no periorbital swelling, no proptosis external nose and ears are normal mouth: no lesion seen NECK: supple, thyroid is not enlarged CHEST WALL: no deformity LUNGS: clear to auscultation BREASTS:  No gynecomastia CV: reg rate  and rhythm, no murmur ABD: abdomen is soft, nontender.  no hepatosplenomegaly.  not distended.  no hernia MUSCULOSKELETAL: muscle bulk and strength are grossly normal.  no obvious joint swelling.  gait is normal and steady PULSES: dorsalis pedis intact bilat.  no carotid bruit NEURO:  cn 2-12 grossly intact.   readily moves all 4's.  sensation is intact to touch on the feet SKIN:  Normal texture and temperature.  No rash or suspicious lesion is visible.   NODES:  None palpable at the neck PSYCH: alert, oriented x3.  Does not appear anxious nor depressed.       Assessment & Plan:  we discussed code status.  pt requests full code, but would not want to be started or maintained on artificial life-support measures if there was not a reasonable chance of  recovery    SEPARATE EVALUATION FOLLOWS--EACH PROBLEM HERE IS NEW, NOT RESPONDING TO TREATMENT, OR POSES SIGNIFICANT RISK TO THE PATIENT'S HEALTH: HISTORY OF THE PRESENT ILLNESS: The state of at least three ongoing medical problems is addressed today, with interval history of each noted here: Renal insufficiency: denies weight change Anemia.  He take fe 1/day.  Denies hematuria Hyperkalemia: has recurred.  Denies numbness PAST MEDICAL HISTORY reviewed and up to date today REVIEW OF SYSTEMS: Denies dysuria and sob PHYSICAL EXAMINATION: VITAL SIGNS:  See vs page GENERAL: no distress EXTEMITIES: no deformity.  no ulcer on the feet.  feet are of normal color and temp.  no edema LAB/XRAY RESULTS: Lab Results  Component Value Date   WBC 4.1* 05/14/2012   HGB 11.1* 05/14/2012   HCT 33.5* 05/14/2012   PLT 239.0 05/14/2012   GLUCOSE 100* 05/14/2012   CHOL 172 05/14/2012   TRIG 98.0 05/14/2012   HDL 109.50 05/14/2012   LDLCALC 43 05/14/2012   ALT 20 05/14/2012   AST 23 05/14/2012   NA 139 05/14/2012   K 5.4* 05/14/2012   CL 104 05/14/2012   CREATININE 1.9* 05/14/2012   BUN 26* 05/14/2012   CO2 25 05/14/2012   TSH 0.42 05/14/2012   PSA 0.73 05/14/2012   HGBA1C 6.4 05/14/2012   MICROALBUR 6.0* 05/12/2011  IMPRESSION: Hyperkalemia, recurrent Renal insufficiency, worse fe-deficiency anemia, uncertain etiology PLAN: See instruction page

## 2012-05-26 DIAGNOSIS — K2270 Barrett's esophagus without dysplasia: Secondary | ICD-10-CM

## 2012-05-26 HISTORY — DX: Barrett's esophagus without dysplasia: K22.70

## 2012-05-29 ENCOUNTER — Telehealth: Payer: Self-pay

## 2012-05-29 NOTE — Telephone Encounter (Signed)
Ok, i just need to know what it is for.  Did you receive a call about appointment with [facility]?

## 2012-05-29 NOTE — Telephone Encounter (Signed)
Please direct message to pcc

## 2012-05-29 NOTE — Telephone Encounter (Signed)
Pt called requesting referral to urologist

## 2012-05-29 NOTE — Telephone Encounter (Signed)
Pt states he has not heard back from anybody about appointment

## 2012-05-30 NOTE — Telephone Encounter (Signed)
Spoke with pt and informed him that referral has been rated # 3 it will be 6-8 weeks before appt will be  made

## 2012-06-06 ENCOUNTER — Encounter: Payer: Self-pay | Admitting: Endocrinology

## 2012-06-07 ENCOUNTER — Telehealth: Payer: Self-pay | Admitting: Internal Medicine

## 2012-06-07 NOTE — Telephone Encounter (Signed)
Patient is due to endo/colon.  He is scheduled for 06/20/12 and a pre-visit for 06/13/12 10:30

## 2012-06-13 ENCOUNTER — Ambulatory Visit (AMBULATORY_SURGERY_CENTER): Payer: 59

## 2012-06-13 ENCOUNTER — Encounter: Payer: Self-pay | Admitting: Internal Medicine

## 2012-06-13 VITALS — Ht 75.0 in | Wt 249.0 lb

## 2012-06-13 DIAGNOSIS — K219 Gastro-esophageal reflux disease without esophagitis: Secondary | ICD-10-CM

## 2012-06-13 DIAGNOSIS — Z860100 Personal history of colon polyps, unspecified: Secondary | ICD-10-CM

## 2012-06-13 MED ORDER — NA SULFATE-K SULFATE-MG SULF 17.5-3.13-1.6 GM/177ML PO SOLN: 1.0000 | Freq: Once | ORAL | Status: DC

## 2012-06-15 ENCOUNTER — Encounter: Payer: Self-pay | Admitting: Internal Medicine

## 2012-06-15 DIAGNOSIS — K219 Gastro-esophageal reflux disease without esophagitis: Secondary | ICD-10-CM

## 2012-06-15 DIAGNOSIS — Z86010 Personal history of colonic polyps: Secondary | ICD-10-CM

## 2012-06-15 MED ORDER — NA SULFATE-K SULFATE-MG SULF 17.5-3.13-1.6 GM/177ML PO SOLN: 1.0000 | Freq: Once | ORAL | Status: DC

## 2012-06-15 NOTE — Telephone Encounter (Signed)
The prescription has been resent 

## 2012-06-20 ENCOUNTER — Encounter: Payer: Self-pay | Admitting: Internal Medicine

## 2012-06-20 ENCOUNTER — Other Ambulatory Visit: Payer: Self-pay | Admitting: Internal Medicine

## 2012-06-20 ENCOUNTER — Ambulatory Visit (AMBULATORY_SURGERY_CENTER): Payer: 59 | Admitting: Internal Medicine

## 2012-06-20 VITALS — BP 136/72 | HR 61 | Temp 98.3°F | Resp 13 | Ht 75.0 in | Wt 249.0 lb

## 2012-06-20 DIAGNOSIS — K219 Gastro-esophageal reflux disease without esophagitis: Secondary | ICD-10-CM

## 2012-06-20 DIAGNOSIS — K5730 Diverticulosis of large intestine without perforation or abscess without bleeding: Secondary | ICD-10-CM

## 2012-06-20 DIAGNOSIS — Z1211 Encounter for screening for malignant neoplasm of colon: Secondary | ICD-10-CM

## 2012-06-20 DIAGNOSIS — Z86010 Personal history of colonic polyps: Secondary | ICD-10-CM

## 2012-06-20 DIAGNOSIS — K644 Residual hemorrhoidal skin tags: Secondary | ICD-10-CM

## 2012-06-20 DIAGNOSIS — K2270 Barrett's esophagus without dysplasia: Secondary | ICD-10-CM

## 2012-06-20 DIAGNOSIS — D126 Benign neoplasm of colon, unspecified: Secondary | ICD-10-CM

## 2012-06-20 DIAGNOSIS — K648 Other hemorrhoids: Secondary | ICD-10-CM

## 2012-06-20 LAB — GLUCOSE, CAPILLARY: Glucose-Capillary: 129 mg/dL — ABNORMAL HIGH (ref 70–99)

## 2012-06-20 MED ORDER — SODIUM CHLORIDE 0.9 % IV SOLN: 500.0000 mL | INTRAVENOUS | Status: DC

## 2012-06-20 NOTE — Progress Notes (Signed)
Called to room to assist during endoscopic procedure.  Patient ID and intended procedure confirmed with present staff. Received instructions for my participation in the procedure from the performing physician.  

## 2012-06-20 NOTE — Patient Instructions (Addendum)
The upper endoscopy showed the Barrett's esophagus again - nothing suspicious. I took biopsies and will let you know the results.  You also have a small hiatal hernia.  The colonoscopy revealed one polyp that I removed, diverticulosis and hemorrhoids.  I will let you know pathology results and when to have another routine endoscopy and  colonoscopy by mail.  If the hemorrhoids give you trouble let me know as I will soon have a way to treat them with rubber band ligation in the office.  Thank you for choosing me and [facility].  Iva Boop, MD, FACG  YOU HAD AN ENDOSCOPIC PROCEDURE TODAY AT THE  ENDOSCOPY CENTER: Refer to the procedure report that was given to you for any specific questions about what was found during the examination.  If the procedure report does not answer your questions, please call your gastroenterologist to clarify.  If you requested that your care partner not be given the details of your procedure findings, then the procedure report has been included in a sealed envelope for you to review at your convenience later.  YOU SHOULD EXPECT: Some feelings of bloating in the abdomen. Passage of more gas than usual.  Walking can help get rid of the air that was put into your GI tract during the procedure and reduce the bloating. If you had a lower endoscopy (such as a colonoscopy or flexible sigmoidoscopy) you may notice spotting of blood in your stool or on the toilet paper. If you underwent a bowel prep for your procedure, then you may not have a normal bowel movement for a few days.  DIET: Your first meal following the procedure should be a light meal and then it is ok to progress to your normal diet.  A half-sandwich or bowl of soup is an example of a good first meal.  Heavy or fried foods are harder to digest and may make you feel nauseous or bloated.  Likewise meals heavy in dairy and vegetables can cause extra gas to form and this can also increase the  bloating.  Drink plenty of fluids but you should avoid alcoholic beverages for 24 hours.  ACTIVITY: Your care partner should take you home directly after the procedure.  You should plan to take it easy, moving slowly for the rest of the day.  You can resume normal activity the day after the procedure however you should NOT DRIVE or use heavy machinery for 24 hours (because of the sedation medicines used during the test).    SYMPTOMS TO REPORT IMMEDIATELY: A gastroenterologist can be reached at any hour.  During normal business hours, 8:30 AM to 5:00 PM Monday through Friday, call [phone].  After hours and on weekends, please call the GI answering service at [phone] who will take a message and have the physician on call contact you.   Following lower endoscopy (colonoscopy or flexible sigmoidoscopy):  Excessive amounts of blood in the stool  Significant tenderness or worsening of abdominal pains  Swelling of the abdomen that is new, acute  Fever of 100F or higher  Following upper endoscopy (EGD)  Vomiting of blood or coffee ground material  New chest pain or pain under the shoulder blades  Painful or persistently difficult swallowing  New shortness of breath  Fever of 100F or higher  Black, tarry-looking stools  FOLLOW UP: If any biopsies were taken you will be contacted by phone or by letter within the next 1-3 weeks.  Call your gastroenterologist if  you have not heard about the biopsies in 3 weeks.  Our staff will call the home number listed on your records the next business day following your procedure to check on you and address any questions or concerns that you may have at that time regarding the information given to you following your procedure. This is a courtesy call and so if there is no answer at the home number and we have not heard from you through the emergency physician on call, we will assume that you have returned to your regular daily activities without  incident.  SIGNATURES/CONFIDENTIALITY: You and/or your care partner have signed paperwork which will be entered into your electronic medical record.  These signatures attest to the fact that that the information above on your After Visit Summary has been reviewed and is understood.  Full responsibility of the confidentiality of this discharge information lies with you and/or your care-partner.  Polyp-handout given  Hiatal Hernia-handout given  Barretts's esophagus-handout given  Diverticulosis-handout given  Hemorrhoids-handout give  Repeat will be determined by pathology

## 2012-06-20 NOTE — Progress Notes (Signed)
Stable to RR 

## 2012-06-20 NOTE — Progress Notes (Signed)
Patient did not experience any of the following events: a burn prior to discharge; a fall within the facility; wrong site/side/patient/procedure/implant event; or a hospital transfer or hospital admission upon discharge from the facility. (G8907) Patient did not have preoperative order for IV antibiotic SSI prophylaxis. (G8918)  

## 2012-06-20 NOTE — Op Note (Signed)
[facility] [address] [place], [address]   ENDOSCOPY PROCEDURE REPORT  PATIENT: Mitchell, Alvarado  MR#: [id number] BIRTHDATE: 21-Jan-1951 , 62  yrs. old GENDER: Male ENDOSCOPIST: Iva Boop, MD, [facility] PROCEDURE DATE:  06/20/2012 PROCEDURE:  Esophagoscopy  and biopsy ASA CLASS:     Class II INDICATIONS:  follow up of Barrett's esophagus. MEDICATIONS: Propofol (Diprivan) 160 mg IV, MAC sedation, administered by CRNA, and These medications were titrated to patient response per physician's verbal order TOPICAL ANESTHETIC: none  DESCRIPTION OF PROCEDURE: After the risks benefits and alternatives of the procedure were thoroughly explained, informed consent was obtained.  The LB [id number] endoscope was introduced through the mouth and advanced to the stomach antrum. Without limitations. The instrument was slowly withdrawn as the mucosa was fully examined.        ESOPHAGUS: There was evidence of Barrett's esophagus at the gastroesophageal junction. Short-segment < 1 cm tongues with some columnar islands.  Multiple biopsies were performed using cold forceps.  Sample sent for histology.   A 3 cm hiatal hernia was noted.  STOMACH: The stomach mucosa appeared normal in the entire examined stomach.  Retroflexed views revealed a hiatal hernia.     The scope was then withdrawn from the patient and the procedure completed.  COMPLICATIONS: There were no complications. ENDOSCOPIC IMPRESSION: 1.   There was evidence of short-segment Barrett's esophagus; multiple biopsies 2.   3 cm hiatal hernia 3.   The stomach mucosa appeared normal in the entire examined stomach  RECOMMENDATIONS: 1.  Await pathology results 2.  Proceed with colonoscopy  REPEAT EXAM: for EGD pending biopsy results.  eSigned:  Iva Boop, MD, [facility] 06/20/2012 2:04 PM   CC:The Patient

## 2012-06-20 NOTE — Op Note (Signed)
[facility] [address] [place], [address]   COLONOSCOPY PROCEDURE REPORT  PATIENT: Mitchell Alvarado, Mitchell Alvarado  MR#: [id number] BIRTHDATE: [birth year] , 62  yrs. old GENDER: Male ENDOSCOPIST: Iva Boop, MD, [facility] PROCEDURE DATE:  06/20/2012 PROCEDURE:   Colonoscopy with snare polypectomy ASA CLASS:   Class II INDICATIONS:Screening and surveillance,personal history of colonic polyps. MEDICATIONS: There was residual sedation effect present from prior procedure, Propofol (Diprivan) 240 mg IV, MAC sedation, administered by CRNA, and These medications were titrated to patient response per physician's verbal order  DESCRIPTION OF PROCEDURE:   After the risks benefits and alternatives of the procedure were thoroughly explained, informed consent was obtained.  A digital rectal exam revealed no prostatic nodules, A digital rectal exam revealed the prostate was not enlarged, and A digital rectal exam revealed internal and external hemorrhoids and a palpable papilla.   The LB [id number] endoscope was introduced through the anus and advanced to the cecum, which was identified by both the appendix and ileocecal valve. No adverse events experienced.   The quality of the prep was Suprep excellent  The instrument was then slowly withdrawn as the colon was fully examined.      COLON FINDINGS: A polypoid shaped sessile polyp measuring 1 cm in size was found at the cecum.  A polypectomy was performed with a cold snare, piecemeal.  The resection was complete and the polyp tissue was completely retrieved.   Moderate diverticulosis was noted in the sigmoid colon.   Moderate sized internal and external hemorrhoids w/ hypertrophy of papillaewere found.   The colon mucosa was otherwise normal.   A right colon retroflexion was performed.  Retroflexed views revealed internal/external hemorrhoids. The time to cecum=3 minutes 05 seconds.  Withdrawal time=10 minutes 42 seconds.   The scope was withdrawn and the procedure completed. COMPLICATIONS: There were no complications.  ENDOSCOPIC IMPRESSION: 1.   Sessile polyp measuring 1 cm in size was found at the cecum; polypectomy was performed with a cold snare 2.   Moderate diverticulosis was noted in the sigmoid colon 3.   Moderate sized internal and external hemorrhoids, hypertrophy of papillae. 4.   The colon mucosa was otherwise normal w/ excellent prep in patient w/ tubular adenoma removed in 2006.  RECOMMENDATIONS: Timing of repeat colonoscopy will be determined by pathology findings.   eSigned:  Iva Boop, MD, [facility] 06/20/2012 2:11 PM   cc: The Patient

## 2012-06-21 ENCOUNTER — Telehealth: Payer: Self-pay | Admitting: *Deleted

## 2012-06-21 NOTE — Telephone Encounter (Signed)
  Follow up Call-  Call back number 06/20/2012  Post procedure Call Back phone  # [phone]  Permission to leave phone message Yes     Patient questions:  Do you have a fever, pain , or abdominal swelling? no Pain Score  0 *  Have you tolerated food without any problems? yes  Have you been able to return to your normal activities? yes  Do you have any questions about your discharge instructions: Diet   no Medications  no Follow up visit  no  Do you have questions or concerns about your Care? no  Actions: * If pain score is 4 or above: No action needed, pain <4.

## 2012-06-22 ENCOUNTER — Other Ambulatory Visit: Payer: Self-pay | Admitting: *Deleted

## 2012-06-22 MED ORDER — OMEPRAZOLE MAGNESIUM 20 MG PO TBEC: 20.0000 mg | DELAYED_RELEASE_TABLET | Freq: Every day | ORAL | Status: DC

## 2012-06-27 ENCOUNTER — Encounter: Payer: Self-pay | Admitting: Internal Medicine

## 2012-06-27 NOTE — Progress Notes (Signed)
Quick Note:  1) EGD short Barrett's no dysplasia repeat 5 yrs 05/2017 approx 2) 1 cm tubular adenoma from cecum - repeat colonoscopy about 05/2015 ______

## 2012-12-24 ENCOUNTER — Other Ambulatory Visit: Payer: Self-pay

## 2012-12-24 MED ORDER — CYANOCOBALAMIN 1000 MCG/ML IJ SOLN: INTRAMUSCULAR | Status: DC

## 2012-12-29 ENCOUNTER — Encounter: Payer: Self-pay | Admitting: Endocrinology

## 2012-12-31 ENCOUNTER — Encounter: Payer: Self-pay | Admitting: Endocrinology

## 2013-05-13 ENCOUNTER — Other Ambulatory Visit: Payer: Self-pay

## 2013-05-13 ENCOUNTER — Other Ambulatory Visit (INDEPENDENT_AMBULATORY_CARE_PROVIDER_SITE_OTHER): Payer: 59

## 2013-05-13 DIAGNOSIS — Z0000 Encounter for general adult medical examination without abnormal findings: Secondary | ICD-10-CM

## 2013-05-13 LAB — CBC WITH DIFFERENTIAL/PLATELET
Basophils Absolute: 0 10*3/uL (ref 0.0–0.1)
Basophils Relative: 0.6 % (ref 0.0–3.0)
Eosinophils Absolute: 0.1 10*3/uL (ref 0.0–0.7)
Eosinophils Relative: 1.7 % (ref 0.0–5.0)
HCT: 36.5 % — ABNORMAL LOW (ref 39.0–52.0)
Hemoglobin: 11.9 g/dL — ABNORMAL LOW (ref 13.0–17.0)
Lymphocytes Relative: 34.9 % (ref 12.0–46.0)
Lymphs Abs: 1.3 10*3/uL (ref 0.7–4.0)
MCHC: 32.7 g/dL (ref 30.0–36.0)
MCV: 104.2 fl — ABNORMAL HIGH (ref 78.0–100.0)
Monocytes Absolute: 0.3 10*3/uL (ref 0.1–1.0)
Monocytes Relative: 8.5 % (ref 3.0–12.0)
Neutro Abs: 2.1 10*3/uL (ref 1.4–7.7)
Neutrophils Relative %: 54.3 % (ref 43.0–77.0)
Platelets: 215 10*3/uL (ref 150.0–400.0)
RBC: 3.5 Mil/uL — ABNORMAL LOW (ref 4.22–5.81)
RDW: 14 % (ref 11.5–14.6)
WBC: 3.8 10*3/uL — ABNORMAL LOW (ref 4.5–10.5)

## 2013-05-13 LAB — LIPID PANEL
CHOL/HDL RATIO: 2
CHOLESTEROL: 172 mg/dL (ref 0–200)
HDL: 110.6 mg/dL (ref >39.00)
LDL CALC: 21 mg/dL (ref 0–99)
TRIGLYCERIDES: 200 mg/dL — AB (ref 0.0–149.0)
VLDL: 40 mg/dL (ref 0.0–40.0)

## 2013-05-13 LAB — MICROALBUMIN / CREATININE URINE RATIO
Creatinine,U: 166.3 mg/dL
Microalb Creat Ratio: 1.6 mg/g (ref 0.0–30.0)
Microalb, Ur: 2.6 mg/dL — ABNORMAL HIGH (ref 0.0–1.9)

## 2013-05-13 LAB — TSH: TSH: 0.49 u[IU]/mL (ref 0.35–5.50)

## 2013-05-13 LAB — HEMOGLOBIN A1C: Hgb A1c MFr Bld: 6.7 % — ABNORMAL HIGH (ref 4.6–6.5)

## 2013-05-13 LAB — PSA: PSA: 0.6 ng/mL (ref 0.10–4.00)

## 2013-05-14 LAB — HEPATIC FUNCTION PANEL (6)
ALBUMIN: 4.3 g/dL (ref 3.6–4.8)
ALK PHOS: 62 IU/L (ref 39–117)
ALT: 20 IU/L (ref 0–44)
AST: 23 IU/L (ref 0–40)
Bilirubin, Direct: 0.21 mg/dL (ref 0.00–0.40)
Total Bilirubin: 0.8 mg/dL (ref 0.0–1.2)

## 2013-05-15 LAB — BASIC METABOLIC PANEL WITH GFR
BUN: 14 mg/dL (ref 6–23)
CO2: 24 meq/L (ref 19–32)
Calcium: 9 mg/dL (ref 8.4–10.5)
Chloride: 101 mEq/L (ref 96–112)
Creat: 1.34 mg/dL (ref 0.50–1.35)
GFR, Est African American: 65 mL/min
GFR, Est Non African American: 56 mL/min — ABNORMAL LOW
GLUCOSE: 126 mg/dL — AB (ref 70–99)
POTASSIUM: 4.2 meq/L (ref 3.5–5.3)
SODIUM: 138 meq/L (ref 135–145)

## 2013-05-22 ENCOUNTER — Ambulatory Visit (INDEPENDENT_AMBULATORY_CARE_PROVIDER_SITE_OTHER): Payer: 59 | Admitting: Endocrinology

## 2013-05-22 ENCOUNTER — Telehealth: Payer: Self-pay

## 2013-05-22 ENCOUNTER — Encounter: Payer: Self-pay | Admitting: Endocrinology

## 2013-05-22 VITALS — BP 118/80 | HR 96 | Temp 98.3°F | Ht 75.0 in | Wt 246.0 lb

## 2013-05-22 DIAGNOSIS — Z0000 Encounter for general adult medical examination without abnormal findings: Secondary | ICD-10-CM

## 2013-05-22 MED ORDER — CARVEDILOL 3.125 MG PO TABS: ORAL_TABLET | ORAL | Status: DC

## 2013-05-22 MED ORDER — SAXAGLIPTIN HCL 5 MG PO TABS: ORAL_TABLET | ORAL | Status: DC

## 2013-05-22 MED ORDER — TADALAFIL 5 MG PO TABS: 5.0000 mg | ORAL_TABLET | Freq: Every day | ORAL | Status: DC

## 2013-05-22 MED ORDER — BROMOCRIPTINE MESYLATE 2.5 MG PO TABS: 2.5000 mg | ORAL_TABLET | Freq: Every day | ORAL | Status: DC

## 2013-05-22 MED ORDER — BIMATOPROST 0.01 % OP SOLN: 1.0000 [drp] | Freq: Every day | OPHTHALMIC | Status: DC

## 2013-05-22 MED ORDER — OMEPRAZOLE MAGNESIUM 20 MG PO TBEC: 20.0000 mg | DELAYED_RELEASE_TABLET | Freq: Every day | ORAL | Status: DC

## 2013-05-22 MED ORDER — "SYRINGE/NEEDLE (DISP) 25G X 1"" 3 ML MISC": Status: DC

## 2013-05-22 MED ORDER — HYDROCHLOROTHIAZIDE 25 MG PO TABS: 25.0000 mg | ORAL_TABLET | Freq: Every day | ORAL | Status: DC

## 2013-05-22 MED ORDER — CYANOCOBALAMIN 1000 MCG/ML IJ SOLN: INTRAMUSCULAR | Status: DC

## 2013-05-22 MED ORDER — PIOGLITAZONE HCL 45 MG PO TABS: ORAL_TABLET | ORAL | Status: DC

## 2013-05-22 MED ORDER — FERROUS SULFATE 325 (65 FE) MG PO TABS: 325.0000 mg | ORAL_TABLET | Freq: Every day | ORAL | Status: DC

## 2013-05-22 MED ORDER — EZETIMIBE-SIMVASTATIN 10-80 MG PO TABS: ORAL_TABLET | ORAL | Status: DC

## 2013-05-22 MED ORDER — ASPIRIN 81 MG PO TABS: 81.0000 mg | ORAL_TABLET | Freq: Every day | ORAL | Status: AC

## 2013-05-22 MED ORDER — CALCITRIOL 0.25 MCG PO CAPS: 0.2500 ug | ORAL_CAPSULE | Freq: Every day | ORAL | Status: DC

## 2013-05-22 NOTE — Telephone Encounter (Signed)
Done

## 2013-05-22 NOTE — Progress Notes (Signed)
Subjective:    Patient ID: Mitchell Alvarado, male    DOB: [birth year], 63 y.o.   MRN: [id number]  HPI Pt is here for regular wellness examination, and is feeling pretty well in general, and says chronic med probs are stable, except as noted below Past Medical History  Diagnosis Date  . ANEMIA, IRON DEFICIENCY 12/23/2009  . DIABETES MELLITUS, TYPE II 12/30/2006  . ECZEMA 05/15/2009  . GERD 12/30/2006  . HYPERLIPIDEMIA 12/30/2006  . HYPERTENSION 12/30/2006  . HYPERTHYROIDISM 06/03/2008  . Pernicious anemia 12/30/2006  . Personal history of colonic polyps - adenoma 07/22/2004    Past Surgical History  Procedure Laterality Date  . Transurethral resection of prostate    . Esophagogastroduodenoscopy  multiple  . Colonoscopy  multiple    History   Social History  . Marital Status: Married    Spouse Name: N/A    Number of Children: N/A  . Years of Education: N/A   Occupational History  . Retired from school system    Social History Main Topics  . Smoking status: Former Research scientist (life sciences)  . Smokeless tobacco: Not on file  . Alcohol Use: 6.0 oz/week    10 Glasses of wine per week  . Drug Use: No  . Sexual Activity: Not on file   Other Topics Concern  . Not on file   Social History Narrative  . No narrative on file    No current outpatient prescriptions on file prior to visit.   No current facility-administered medications on file prior to visit.    No Known Allergies  Family History  Problem Relation Age of Onset  . Heart attack Father     MI  . Depression Son   . COPD Son   . Cancer Neg Hx   . Colon cancer Neg Hx     BP 118/80  Pulse 96  Temp(Src) 98.3 F (36.8 C) (Oral)  Ht 6\' 3"  (1.905 m)  Wt 246 lb (111.585 kg)  BMI 30.75 kg/m2  SpO2 97%  Review of Systems  Constitutional: Negative for fever and unexpected weight change.  HENT: Negative for hearing loss.   Eyes: Negative for visual disturbance.  Respiratory: Negative for shortness of breath.   Cardiovascular:  Negative for chest pain.  Gastrointestinal: Negative for anal bleeding.  Endocrine: Negative for cold intolerance.  Genitourinary: Negative for hematuria and difficulty urinating.  Musculoskeletal: Negative for back pain.  Skin: Negative for rash.  Allergic/Immunologic: Positive for environmental allergies.  Neurological: Negative for syncope.  Hematological: Does not bruise/bleed easily.  Psychiatric/Behavioral: Negative for dysphoric mood.       Objective:   Physical Exam VS: see vs page GEN: no distress HEAD: head: no deformity eyes: no periorbital swelling, no proptosis external nose and ears are normal mouth: no lesion seen NECK: supple, thyroid is not enlarged CHEST WALL: no deformity LUNGS: clear to auscultation BREASTS:  No gynecomastia CV: reg rate and rhythm, no murmur ABD: abdomen is soft, nontender.  no hepatosplenomegaly.  not distended.  no hernia RECTAL: normal external and internal exam.  heme neg. PROSTATE:  Normal size.  No nodule MUSCULOSKELETAL: muscle bulk and strength are grossly normal.  no obvious joint swelling.  gait is normal and steady PULSES: no carotid bruit NEURO:  cn 2-12 grossly intact.   readily moves all 4's.  SKIN:  Normal texture and temperature.  No rash or suspicious lesion is visible.   NODES:  None palpable at the neck PSYCH: alert, well-oriented.  Does not appear  anxious nor depressed.   i reviewed electrocardiogram.    Assessment & Plan:  Wellness visit today, with problems stable, except as noted. we discussed code status.  pt requests full code, but would not want to be started or maintained on artificial life-support measures if there was not a reasonable chance of recovery

## 2013-05-22 NOTE — Telephone Encounter (Signed)
Message copied by Hanley Seamen on Wed May 22, 2013  5:00 PM ------      Message from: Renato Shin      Created: Wed May 22, 2013  4:56 PM       Pt requests that all meds be rx'ed x 1 year, 90 days at a time, thanks. ------

## 2013-05-22 NOTE — Patient Instructions (Addendum)
Please come back for a follow-up appointment in 4 months.  Let's recheck the blood sugar and iron then.  please consider these measures for your health:  minimize alcohol.  do not use tobacco products.  have a colonoscopy at least every 10 years from age 63.  keep firearms safely stored.  always use seat belts.  have working smoke alarms in your home.  see an eye doctor and dentist regularly.  never drive under the influence of alcohol or drugs (including prescription drugs).   Please continue the same medications.

## 2013-05-23 DIAGNOSIS — Z0000 Encounter for general adult medical examination without abnormal findings: Secondary | ICD-10-CM | POA: Insufficient documentation

## 2013-09-06 ENCOUNTER — Ambulatory Visit (INDEPENDENT_AMBULATORY_CARE_PROVIDER_SITE_OTHER): Payer: 59 | Admitting: Endocrinology

## 2013-09-06 ENCOUNTER — Encounter: Payer: Self-pay | Admitting: Endocrinology

## 2013-09-06 VITALS — BP 112/68 | HR 91 | Temp 98.2°F | Ht 75.0 in | Wt 254.0 lb

## 2013-09-06 DIAGNOSIS — E119 Type 2 diabetes mellitus without complications: Secondary | ICD-10-CM

## 2013-09-06 LAB — HEMOGLOBIN A1C: Hgb A1c MFr Bld: 6.5 % (ref 4.6–6.5)

## 2013-09-06 MED ORDER — SILDENAFIL CITRATE 20 MG PO TABS: ORAL_TABLET | ORAL | Status: DC

## 2013-09-06 NOTE — Progress Notes (Signed)
Subjective:    Patient ID: Mitchell Alvarado, male    DOB: [birth year], 63 y.o.   MRN: [id number]  HPI Pt states 9 months of intermittent slight numbness of the right leg, but no assoc weakness.   Past Medical History  Diagnosis Date  . ANEMIA, IRON DEFICIENCY 12/23/2009  . DIABETES MELLITUS, TYPE II 12/30/2006  . ECZEMA 05/15/2009  . GERD 12/30/2006  . HYPERLIPIDEMIA 12/30/2006  . HYPERTENSION 12/30/2006  . HYPERTHYROIDISM 06/03/2008  . Pernicious anemia 12/30/2006  . Personal history of colonic polyps - adenoma 07/22/2004    Past Surgical History  Procedure Laterality Date  . Transurethral resection of prostate    . Esophagogastroduodenoscopy  multiple  . Colonoscopy  multiple    History   Social History  . Marital Status: Married    Spouse Name: N/A    Number of Children: N/A  . Years of Education: N/A   Occupational History  . Retired from school system    Social History Main Topics  . Smoking status: Former Research scientist (life sciences)  . Smokeless tobacco: Not on file  . Alcohol Use: 6.0 oz/week    10 Glasses of wine per week  . Drug Use: No  . Sexual Activity: Not on file   Other Topics Concern  . Not on file   Social History Narrative  . No narrative on file    Current Outpatient Prescriptions on File Prior to Visit  Medication Sig Dispense Refill  . aspirin 81 MG tablet Take 1 tablet (81 mg total) by mouth daily.  90 tablet  3  . bimatoprost (LUMIGAN) 0.01 % SOLN Place 1 drop into both eyes at bedtime.  5 mL  2  . bromocriptine (PARLODEL) 2.5 MG tablet Take 1 tablet (2.5 mg total) by mouth at bedtime.  30 tablet  11  . calcitRIOL (ROCALTROL) 0.25 MCG capsule Take 1 capsule (0.25 mcg total) by mouth daily. Take Monday Wednesday and Friday.  40 capsule  3  . carvedilol (COREG) 3.125 MG tablet TAKE 1 TABLET TWICE A DAY  60 tablet  11  . cyanocobalamin (,VITAMIN B-12,) 1000 MCG/ML injection INJECT 1ML INTRAMUSCULARY ONCE A MONTH  1 mL  6  . ezetimibe-simvastatin (VYTORIN) 10-80 MG  per tablet TAKE 1 TABLET BY MOUTH EVERY DAY  90 tablet  3  . ferrous sulfate 325 (65 FE) MG tablet Take 1 tablet (325 mg total) by mouth daily with breakfast.  90 tablet  3  . hydrochlorothiazide (HYDRODIURIL) 25 MG tablet Take 1 tablet (25 mg total) by mouth daily.  90 tablet  3  . omeprazole (PRILOSEC OTC) 20 MG tablet Take 1 tablet (20 mg total) by mouth daily.  90 tablet  3  . pioglitazone (ACTOS) 45 MG tablet TAKE 1 TABLET BY MOUTH EVERY DAY  90 tablet  3  . saxagliptin HCl (ONGLYZA) 5 MG TABS tablet TAKE 1 TABLET BY MOUTH EVERY MORNING  90 tablet  3  . SYRINGE-NEEDLE, DISP, 3 ML (BD INTEGRA SYRINGE) 25G X 1" 3 ML MISC USE 1 SYRINGE ONCE A MONTH AS DIRECTED  3 each  3   No current facility-administered medications on file prior to visit.    No Known Allergies  Family History  Problem Relation Age of Onset  . Heart attack Father     MI  . Depression Son   . COPD Son   . Cancer Neg Hx   . Colon cancer Neg Hx     BP 112/68  Pulse 91  Temp(Src) 98.2 F (36.8 C) (Oral)  Ht 6\' 3"  (1.905 m)  Wt 254 lb (115.214 kg)  BMI 31.75 kg/m2  SpO2 97%    Review of Systems Denies leg pain.  Ed sxs are worse recently.    Objective:   Physical Exam Pulses: dorsalis pedis intact bilat.   Feet: no deformity. normal color and temp.  no edema Skin:  no ulcer on the feet.   Neuro: sensation is intact to touch on the feet   Lab Results  Component Value Date   HGBA1C 6.5 09/06/2013      Assessment & Plan:  DM: well-controlled ED: moderate exacerbation   Patient is advised the following: Patient Instructions  Please call if the numbness increases, so we can do a nerve-ending test.   Here is a prescription for a 20 mg version of viagra.  check your blood sugar once a day.  vary the time of day when you check, between before the 3 meals, and at bedtime.  also check if you have symptoms of your blood sugar being too high or too low.  please keep a record of the readings and bring it to  your next appointment here.  You can write it on any piece of paper.  please call us sooner if your blood sugar goes below 70, or if you have a lot of readings over 200. blood tests are being requested for you today.  We'll contact you with results. Please come back for a follow-up appointment in 4 months

## 2013-09-06 NOTE — Patient Instructions (Addendum)
Please call if the numbness increases, so we can do a nerve-ending test.   Here is a prescription for a 20 mg version of viagra.  check your blood sugar once a day.  vary the time of day when you check, between before the 3 meals, and at bedtime.  also check if you have symptoms of your blood sugar being too high or too low.  please keep a record of the readings and bring it to your next appointment here.  You can write it on any piece of paper.  please call us sooner if your blood sugar goes below 70, or if you have a lot of readings over 200. blood tests are being requested for you today.  We'll contact you with results. Please come back for a follow-up appointment in 4 months

## 2013-09-19 ENCOUNTER — Ambulatory Visit: Payer: 59 | Admitting: Endocrinology

## 2014-01-10 ENCOUNTER — Other Ambulatory Visit: Payer: Self-pay

## 2014-01-14 ENCOUNTER — Ambulatory Visit: Payer: 59 | Admitting: Endocrinology

## 2014-01-15 ENCOUNTER — Ambulatory Visit (INDEPENDENT_AMBULATORY_CARE_PROVIDER_SITE_OTHER): Payer: 59 | Admitting: Endocrinology

## 2014-01-15 ENCOUNTER — Encounter: Payer: Self-pay | Admitting: Endocrinology

## 2014-01-15 VITALS — BP 139/88 | HR 91 | Temp 98.4°F | Wt 259.0 lb

## 2014-01-15 DIAGNOSIS — Z23 Encounter for immunization: Secondary | ICD-10-CM

## 2014-01-15 DIAGNOSIS — E1122 Type 2 diabetes mellitus with diabetic chronic kidney disease: Secondary | ICD-10-CM

## 2014-01-15 DIAGNOSIS — N189 Chronic kidney disease, unspecified: Secondary | ICD-10-CM

## 2014-01-15 LAB — BASIC METABOLIC PANEL
BUN: 16 mg/dL (ref 6–23)
CALCIUM: 9 mg/dL (ref 8.4–10.5)
CHLORIDE: 103 meq/L (ref 96–112)
CO2: 25 meq/L (ref 19–32)
Creatinine, Ser: 1.4 mg/dL (ref 0.4–1.5)
GFR: 65.15 mL/min (ref >60.00)
GLUCOSE: 115 mg/dL — AB (ref 70–99)
Potassium: 4.3 mEq/L (ref 3.5–5.1)
SODIUM: 139 meq/L (ref 135–145)

## 2014-01-15 LAB — HEMOGLOBIN A1C: Hgb A1c MFr Bld: 6.4 % (ref 4.6–6.5)

## 2014-01-15 NOTE — Progress Notes (Signed)
Subjective:    Patient ID: Mitchell Alvarado, male    DOB: 1950-10-07, 63 y.o.   MRN: [id number]  HPI Pt returns for f/u of diabetes mellitus: DM type: 2 Dx'ed: 1996.   Complications: polyneuropathy, renal insufficiency, and non-proliferative retinopathy Therapy: 3 orals DKA: never.   Severe hypoglycemia: never Pancreatitis: never Other: he has never been on insulin.  He can't take metformin, due to renal insufficiency.   Interval history: Pt says cbg's are well-controlled.  pt states he feels well in general.  Past Medical History  Diagnosis Date  . ANEMIA, IRON DEFICIENCY 12/23/2009  . DIABETES MELLITUS, TYPE II 12/30/2006  . ECZEMA 05/15/2009  . GERD 12/30/2006  . HYPERLIPIDEMIA 12/30/2006  . HYPERTENSION 12/30/2006  . HYPERTHYROIDISM 06/03/2008  . Pernicious anemia 12/30/2006  . Personal history of colonic polyps - adenoma 07/22/2004    Past Surgical History  Procedure Laterality Date  . Transurethral resection of prostate    . Esophagogastroduodenoscopy  multiple  . Colonoscopy  multiple    History   Social History  . Marital Status: Married    Spouse Name: N/A    Number of Children: N/A  . Years of Education: N/A   Occupational History  . Retired from school system    Social History Main Topics  . Smoking status: Former Research scientist (life sciences)  . Smokeless tobacco: Not on file  . Alcohol Use: 6.0 oz/week    10 Glasses of wine per week  . Drug Use: No  . Sexual Activity: Not on file   Other Topics Concern  . Not on file   Social History Narrative  . No narrative on file    Current Outpatient Prescriptions on File Prior to Visit  Medication Sig Dispense Refill  . aspirin 81 MG tablet Take 1 tablet (81 mg total) by mouth daily.  90 tablet  3  . bimatoprost (LUMIGAN) 0.01 % SOLN Place 1 drop into both eyes at bedtime.  5 mL  2  . bromocriptine (PARLODEL) 2.5 MG tablet Take 1 tablet (2.5 mg total) by mouth at bedtime.  30 tablet  11  . calcitRIOL (ROCALTROL) 0.25 MCG capsule  Take 1 capsule (0.25 mcg total) by mouth daily. Take Monday Wednesday and Friday.  40 capsule  3  . carvedilol (COREG) 3.125 MG tablet TAKE 1 TABLET TWICE A DAY  60 tablet  11  . cyanocobalamin (,VITAMIN B-12,) 1000 MCG/ML injection INJECT 1ML INTRAMUSCULARY ONCE A MONTH  1 mL  6  . ezetimibe-simvastatin (VYTORIN) 10-80 MG per tablet TAKE 1 TABLET BY MOUTH EVERY DAY  90 tablet  3  . ferrous sulfate 325 (65 FE) MG tablet Take 1 tablet (325 mg total) by mouth daily with breakfast.  90 tablet  3  . hydrochlorothiazide (HYDRODIURIL) 25 MG tablet Take 1 tablet (25 mg total) by mouth daily.  90 tablet  3  . omeprazole (PRILOSEC OTC) 20 MG tablet Take 1 tablet (20 mg total) by mouth daily.  90 tablet  3  . pioglitazone (ACTOS) 45 MG tablet TAKE 1 TABLET BY MOUTH EVERY DAY  90 tablet  3  . saxagliptin HCl (ONGLYZA) 5 MG TABS tablet TAKE 1 TABLET BY MOUTH EVERY MORNING  90 tablet  3  . sildenafil (REVATIO) 20 MG tablet 2-5 pills as needed for ED symptoms  50 tablet  10  . SYRINGE-NEEDLE, DISP, 3 ML (BD INTEGRA SYRINGE) 25G X 1" 3 ML MISC USE 1 SYRINGE ONCE A MONTH AS DIRECTED  3 each  3  No current facility-administered medications on file prior to visit.    No Known Allergies  Family History  Problem Relation Age of Onset  . Heart attack Father     MI  . Depression Son   . COPD Son   . Cancer Neg Hx   . Colon cancer Neg Hx     BP 139/88  Pulse 91  Temp(Src) 98.4 F (36.9 C) (Oral)  Wt 259 lb (117.482 kg)  SpO2 96%  Review of Systems Denies weight change and n/v.     Objective:   Physical Exam VITAL SIGNS:  See vs page GENERAL: no distress Pulses: dorsalis pedis intact bilat.   Feet: no deformity.  no edema Skin:  no ulcer on the feet.  normal color and temp. Neuro: sensation is intact to touch on the feet   Lab Results  Component Value Date   HGBA1C 6.4 01/15/2014       Assessment & Plan:  DM: well-controlled.   Morbid obesity: not improved.    Patient is advised the  following: Patient Instructions  check your blood sugar once a day.  vary the time of day when you check, between before the 3 meals, and at bedtime.  also check if you have symptoms of your blood sugar being too high or too low.  please keep a record of the readings and bring it to your next appointment here.  You can write it on any piece of paper.  please call us sooner if your blood sugar goes below 70, or if you have a lot of readings over 200. blood tests are being requested for you today.  We'll contact you with results.  Please come back for a follow-up appointment in 4 months.  We'll recheck the blood pressure then.   Please see a weight-loss surgery specialist.  you will receive a phone call, about a day and time for an informational meeting.    Please continue the same medication.

## 2014-01-15 NOTE — Patient Instructions (Addendum)
check your blood sugar once a day.  vary the time of day when you check, between before the 3 meals, and at bedtime.  also check if you have symptoms of your blood sugar being too high or too low.  please keep a record of the readings and bring it to your next appointment here.  You can write it on any piece of paper.  please call us sooner if your blood sugar goes below 70, or if you have a lot of readings over 200. blood tests are being requested for you today.  We'll contact you with results.  Please come back for a follow-up appointment in 4 months.  We'll recheck the blood pressure then.   Please see a weight-loss surgery specialist.  you will receive a phone call, about a day and time for an informational meeting.

## 2014-02-23 ENCOUNTER — Other Ambulatory Visit: Payer: Self-pay | Admitting: Endocrinology

## 2014-03-29 ENCOUNTER — Other Ambulatory Visit: Payer: Self-pay | Admitting: Endocrinology

## 2014-05-09 ENCOUNTER — Other Ambulatory Visit (HOSPITAL_COMMUNITY): Payer: Self-pay | Admitting: Nephrology

## 2014-05-09 DIAGNOSIS — N2581 Secondary hyperparathyroidism of renal origin: Secondary | ICD-10-CM

## 2014-05-20 ENCOUNTER — Encounter (HOSPITAL_COMMUNITY): Payer: 59

## 2014-05-23 ENCOUNTER — Encounter: Payer: Self-pay | Admitting: Endocrinology

## 2014-05-23 ENCOUNTER — Ambulatory Visit (INDEPENDENT_AMBULATORY_CARE_PROVIDER_SITE_OTHER): Payer: 59 | Admitting: Endocrinology

## 2014-05-23 VITALS — BP 128/88 | HR 83 | Temp 98.0°F | Wt 263.0 lb

## 2014-05-23 DIAGNOSIS — N189 Chronic kidney disease, unspecified: Secondary | ICD-10-CM

## 2014-05-23 DIAGNOSIS — N289 Disorder of kidney and ureter, unspecified: Secondary | ICD-10-CM

## 2014-05-23 DIAGNOSIS — Z0189 Encounter for other specified special examinations: Secondary | ICD-10-CM

## 2014-05-23 DIAGNOSIS — E1122 Type 2 diabetes mellitus with diabetic chronic kidney disease: Secondary | ICD-10-CM

## 2014-05-23 DIAGNOSIS — Z0000 Encounter for general adult medical examination without abnormal findings: Secondary | ICD-10-CM

## 2014-05-23 DIAGNOSIS — E785 Hyperlipidemia, unspecified: Secondary | ICD-10-CM

## 2014-05-23 DIAGNOSIS — D509 Iron deficiency anemia, unspecified: Secondary | ICD-10-CM

## 2014-05-23 DIAGNOSIS — Z125 Encounter for screening for malignant neoplasm of prostate: Secondary | ICD-10-CM | POA: Insufficient documentation

## 2014-05-23 LAB — PSA: PSA: 0.32 ng/mL (ref 0.10–4.00)

## 2014-05-23 MED ORDER — BROMOCRIPTINE MESYLATE 2.5 MG PO TABS: 2.5000 mg | ORAL_TABLET | Freq: Every day | ORAL | Status: DC

## 2014-05-23 MED ORDER — SAXAGLIPTIN HCL 5 MG PO TABS: ORAL_TABLET | ORAL | Status: DC

## 2014-05-23 MED ORDER — FERROUS SULFATE 325 (65 FE) MG PO TABS: 325.0000 mg | ORAL_TABLET | Freq: Every day | ORAL | Status: DC

## 2014-05-23 MED ORDER — OMEPRAZOLE MAGNESIUM 20 MG PO TBEC: 20.0000 mg | DELAYED_RELEASE_TABLET | Freq: Every day | ORAL | Status: DC

## 2014-05-23 MED ORDER — PIOGLITAZONE HCL 45 MG PO TABS: ORAL_TABLET | ORAL | Status: DC

## 2014-05-23 MED ORDER — CARVEDILOL 3.125 MG PO TABS: ORAL_TABLET | ORAL | Status: DC

## 2014-05-23 MED ORDER — EZETIMIBE-SIMVASTATIN 10-80 MG PO TABS: ORAL_TABLET | ORAL | Status: DC

## 2014-05-23 MED ORDER — HYDROCHLOROTHIAZIDE 25 MG PO TABS: 25.0000 mg | ORAL_TABLET | Freq: Every day | ORAL | Status: DC

## 2014-05-23 NOTE — Patient Instructions (Addendum)
check your blood sugar once a day.  vary the time of day when you check, between before the 3 meals, and at bedtime.  also check if you have symptoms of your blood sugar being too high or too low.  please keep a record of the readings and bring it to your next appointment here.  You can write it on any piece of paper.  please call us sooner if your blood sugar goes below 70, or if you have a lot of readings over 200. A PSA is requested for you today.  We'll contact you with results.   Please come back for a regular physical appointment in 4 months.    You should consider checking an ultrasound of the thyroid, to follow-up the 2012 CT.  Please let me know.

## 2014-05-23 NOTE — Progress Notes (Signed)
Subjective:    Patient ID: Mitchell Alvarado, male    DOB: Jan 27, 1951, 64 y.o.   MRN: [id number]  HPI Pt returns for f/u of diabetes mellitus: DM type: 2 Dx'ed: 1996.   Complications: polyneuropathy, renal insufficiency, and non-proliferative retinopathy. Therapy: 3 oral meds DKA: never.   Severe hypoglycemia: never Pancreatitis: never Other: he has never been on insulin.  He can't take metformin, due to renal insufficiency.   Interval history: Pt says cbg's are well-controlled.  pt states he feels well in general.   He was noted to have elevated PTH at [facility].   Past Medical History  Diagnosis Date  . ANEMIA, IRON DEFICIENCY 12/23/2009  . DIABETES MELLITUS, TYPE II 12/30/2006  . ECZEMA 05/15/2009  . GERD 12/30/2006  . HYPERLIPIDEMIA 12/30/2006  . HYPERTENSION 12/30/2006  . HYPERTHYROIDISM 06/03/2008  . Pernicious anemia 12/30/2006  . Personal history of colonic polyps - adenoma 07/22/2004    Past Surgical History  Procedure Laterality Date  . Transurethral resection of prostate    . Esophagogastroduodenoscopy  multiple  . Colonoscopy  multiple    History   Social History  . Marital Status: Married    Spouse Name: N/A  . Number of Children: N/A  . Years of Education: N/A   Occupational History  . Retired from school system    Social History Main Topics  . Smoking status: Former Research scientist (life sciences)  . Smokeless tobacco: Not on file  . Alcohol Use: 6.0 oz/week    10 Glasses of wine per week  . Drug Use: No  . Sexual Activity: Not on file   Other Topics Concern  . Not on file   Social History Narrative    Current Outpatient Prescriptions on File Prior to Visit  Medication Sig Dispense Refill  . aspirin 81 MG tablet Take 1 tablet (81 mg total) by mouth daily. 90 tablet 3  . bimatoprost (LUMIGAN) 0.01 % SOLN Place 1 drop into both eyes at bedtime. 5 mL 2  . calcitRIOL (ROCALTROL) 0.25 MCG capsule Take 1 capsule (0.25 mcg total) by mouth daily. Take Monday Wednesday and  Friday. 40 capsule 3  . cyanocobalamin (,VITAMIN B-12,) 1000 MCG/ML injection INJECT 1ML INTRAMUSCULARY ONCE A MONTH 1 mL 6  . sildenafil (REVATIO) 20 MG tablet 2-5 pills as needed for ED symptoms 50 tablet 10  . SYRINGE-NEEDLE, DISP, 3 ML (BD INTEGRA SYRINGE) 25G X 1" 3 ML MISC USE 1 SYRINGE ONCE A MONTH AS DIRECTED 3 each 3   No current facility-administered medications on file prior to visit.    No Known Allergies  Family History  Problem Relation Age of Onset  . Heart attack Father     MI  . Depression Son   . COPD Son   . Cancer Neg Hx   . Colon cancer Neg Hx     BP 128/88 mmHg  Pulse 83  Temp(Src) 98 F (36.7 C) (Oral)  Wt 263 lb (119.296 kg)  SpO2 98%  Review of Systems He denies hypoglycemia and weight change    Objective:   Physical Exam VITAL SIGNS:  See vs page GENERAL: no distress Pulses: dorsalis pedis intact bilat.   MSK: no deformity of the feet CV: no leg edema Skin:  no ulcer on the feet.  normal color and temp on the feet.  Neuro: sensation is intact to touch on the feet    i reviewed 2012 chest ct report outside test results are reviewed: PTH=200 Ca++=8.8 A1c=6.9 Lab Results  Component Value Date   PSA 0.32 05/23/2014   PSA 0.60 05/13/2013   PSA 0.73 05/14/2012      Assessment & Plan:  Hyperparathyroidism, new, uncertain etiology Small multinodular goiter, incidentally noted on CT DM: well-controlled.  Patient is advised the following: Patient Instructions  check your blood sugar once a day.  vary the time of day when you check, between before the 3 meals, and at bedtime.  also check if you have symptoms of your blood sugar being too high or too low.  please keep a record of the readings and bring it to your next appointment here.  You can write it on any piece of paper.  please call us sooner if your blood sugar goes below 70, or if you have a lot of readings over 200. A PSA is requested for you today.  We'll contact you with results.     Please come back for a regular physical appointment in 4 months.    You should consider checking an ultrasound of the thyroid, to follow-up the 2012 CT.  Please let me know.

## 2014-05-29 ENCOUNTER — Encounter (HOSPITAL_COMMUNITY)
Admission: RE | Admit: 2014-05-29 | Discharge: 2014-05-29 | Disposition: A | Discharge status: Home / Self Care | Payer: 59 | Source: Ambulatory Visit | Attending: Nephrology | Admitting: Nephrology

## 2014-05-29 DIAGNOSIS — N2581 Secondary hyperparathyroidism of renal origin: Secondary | ICD-10-CM

## 2014-05-29 MED ORDER — TECHNETIUM TC 99M SESTAMIBI GENERIC - CARDIOLITE: 25.0000 | Freq: Once | INTRAVENOUS | Status: AC | PRN

## 2014-05-29 MED ORDER — TECHNETIUM TC 99M SESTAMIBI GENERIC - CARDIOLITE
25.0000 | Freq: Once | INTRAVENOUS | Status: AC | PRN
  Administered 2014-05-29: 25 via INTRAVENOUS

## 2014-08-04 ENCOUNTER — Encounter: Payer: Self-pay | Admitting: Internal Medicine

## 2014-09-22 ENCOUNTER — Ambulatory Visit (INDEPENDENT_AMBULATORY_CARE_PROVIDER_SITE_OTHER): Payer: 59 | Admitting: Endocrinology

## 2014-09-22 ENCOUNTER — Encounter: Payer: Self-pay | Admitting: Endocrinology

## 2014-09-22 ENCOUNTER — Other Ambulatory Visit: Payer: Self-pay

## 2014-09-22 VITALS — BP 136/84 | HR 88 | Temp 98.5°F | Wt 261.0 lb

## 2014-09-22 DIAGNOSIS — E1122 Type 2 diabetes mellitus with diabetic chronic kidney disease: Secondary | ICD-10-CM | POA: Diagnosis not present

## 2014-09-22 DIAGNOSIS — N189 Chronic kidney disease, unspecified: Secondary | ICD-10-CM

## 2014-09-22 DIAGNOSIS — E875 Hyperkalemia: Secondary | ICD-10-CM

## 2014-09-22 DIAGNOSIS — Z0189 Encounter for other specified special examinations: Secondary | ICD-10-CM

## 2014-09-22 DIAGNOSIS — D509 Iron deficiency anemia, unspecified: Secondary | ICD-10-CM | POA: Diagnosis not present

## 2014-09-22 DIAGNOSIS — Z0000 Encounter for general adult medical examination without abnormal findings: Secondary | ICD-10-CM

## 2014-09-22 LAB — HEMOGLOBIN A1C: Hgb A1c MFr Bld: 6.1 % (ref 4.6–6.5)

## 2014-09-22 LAB — CBC WITH DIFFERENTIAL/PLATELET
BASOS ABS: 0 10*3/uL (ref 0.0–0.1)
Basophils Relative: 0.6 % (ref 0.0–3.0)
Eosinophils Absolute: 0 10*3/uL (ref 0.0–0.7)
Eosinophils Relative: 1.1 % (ref 0.0–5.0)
HEMATOCRIT: 37.1 % — AB (ref 39.0–52.0)
Hemoglobin: 12.6 g/dL — ABNORMAL LOW (ref 13.0–17.0)
LYMPHS ABS: 1.3 10*3/uL (ref 0.7–4.0)
Lymphocytes Relative: 30.9 % (ref 12.0–46.0)
MCHC: 33.9 g/dL (ref 30.0–36.0)
MCV: 104.2 fl — ABNORMAL HIGH (ref 78.0–100.0)
Monocytes Absolute: 0.3 10*3/uL (ref 0.1–1.0)
Monocytes Relative: 7.9 % (ref 3.0–12.0)
NEUTROS ABS: 2.5 10*3/uL (ref 1.4–7.7)
Neutrophils Relative %: 59.5 % (ref 43.0–77.0)
Platelets: 204 10*3/uL (ref 150.0–400.0)
RBC: 3.56 Mil/uL — ABNORMAL LOW (ref 4.22–5.81)
RDW: 15 % (ref 11.5–15.5)
WBC: 4.2 10*3/uL (ref 4.0–10.5)

## 2014-09-22 LAB — LIPID PANEL
Cholesterol: 199 mg/dL (ref 0–200)
HDL: 124.7 mg/dL (ref >39.00)
LDL CALC: 55 mg/dL (ref 0–99)
NonHDL: 74.3
Total CHOL/HDL Ratio: 2
Triglycerides: 97 mg/dL (ref 0.0–149.0)
VLDL: 19.4 mg/dL (ref 0.0–40.0)

## 2014-09-22 LAB — IBC PANEL
Iron: 161 ug/dL (ref 42–165)
Saturation Ratios: 48.9 % (ref 20.0–50.0)
Transferrin: 235 mg/dL (ref 212.0–360.0)

## 2014-09-22 LAB — TSH: TSH: 0.88 u[IU]/mL (ref 0.35–4.50)

## 2014-09-22 MED ORDER — SILDENAFIL CITRATE 20 MG PO TABS: ORAL_TABLET | ORAL | Status: DC

## 2014-09-22 NOTE — Progress Notes (Signed)
Subjective:    Patient ID: Mitchell Alvarado, male    DOB: Aug 10, 1950, 64 y.o.   MRN: [id number]  HPI Pt is here for regular wellness examination, and is feeling pretty well in general, and says chronic med probs are stable, except as noted below Past Medical History  Diagnosis Date  . ANEMIA, IRON DEFICIENCY 12/23/2009  . DIABETES MELLITUS, TYPE II 12/30/2006  . ECZEMA 05/15/2009  . GERD 12/30/2006  . HYPERLIPIDEMIA 12/30/2006  . HYPERTENSION 12/30/2006  . HYPERTHYROIDISM 06/03/2008  . Pernicious anemia 12/30/2006  . Personal history of colonic polyps - adenoma 07/22/2004    Past Surgical History  Procedure Laterality Date  . Transurethral resection of prostate    . Esophagogastroduodenoscopy  multiple  . Colonoscopy  multiple    History   Social History  . Marital Status: Married    Spouse Name: N/A  . Number of Children: N/A  . Years of Education: N/A   Occupational History  . Retired from school system    Social History Main Topics  . Smoking status: Former Research scientist (life sciences)  . Smokeless tobacco: Not on file  . Alcohol Use: 6.0 oz/week    10 Glasses of wine per week  . Drug Use: No  . Sexual Activity: Not on file   Other Topics Concern  . Not on file   Social History Narrative    Current Outpatient Prescriptions on File Prior to Visit  Medication Sig Dispense Refill  . aspirin 81 MG tablet Take 1 tablet (81 mg total) by mouth daily. 90 tablet 3  . bimatoprost (LUMIGAN) 0.01 % SOLN Place 1 drop into both eyes at bedtime. 5 mL 2  . bromocriptine (PARLODEL) 2.5 MG tablet Take 1 tablet (2.5 mg total) by mouth at bedtime. 30 tablet 11  . calcitRIOL (ROCALTROL) 0.25 MCG capsule Take 1 capsule (0.25 mcg total) by mouth daily. Take Monday Wednesday and Friday. 40 capsule 3  . carvedilol (COREG) 3.125 MG tablet TAKE 1 TABLET TWICE A DAY 60 tablet 11  . cyanocobalamin (,VITAMIN B-12,) 1000 MCG/ML injection INJECT 1ML INTRAMUSCULARY ONCE A MONTH 1 mL 6  . ezetimibe-simvastatin  (VYTORIN) 10-80 MG per tablet TAKE 1 TABLET BY MOUTH EVERY DAY 90 tablet 3  . ferrous sulfate 325 (65 FE) MG tablet Take 1 tablet (325 mg total) by mouth daily with breakfast. 90 tablet 3  . hydrochlorothiazide (HYDRODIURIL) 25 MG tablet Take 1 tablet (25 mg total) by mouth daily. 90 tablet 3  . omeprazole (PRILOSEC OTC) 20 MG tablet Take 1 tablet (20 mg total) by mouth daily. 90 tablet 3  . pioglitazone (ACTOS) 45 MG tablet TAKE 1 TABLET BY MOUTH EVERY DAY 90 tablet 3  . saxagliptin HCl (ONGLYZA) 5 MG TABS tablet TAKE 1 TABLET BY MOUTH EVERY MORNING 90 tablet 3  . SYRINGE-NEEDLE, DISP, 3 ML (BD INTEGRA SYRINGE) 25G X 1" 3 ML MISC USE 1 SYRINGE ONCE A MONTH AS DIRECTED 3 each 3   No current facility-administered medications on file prior to visit.    No Known Allergies  Family History  Problem Relation Age of Onset  . Heart attack Father     MI  . Depression Son   . COPD Son   . Cancer Neg Hx   . Colon cancer Neg Hx     BP 136/84 mmHg  Pulse 88  Temp(Src) 98.5 F (36.9 C) (Oral)  Wt 261 lb (118.389 kg)  SpO2 98%   Review of Systems  Constitutional: Negative for fever  and unexpected weight change.  HENT: Negative for hearing loss.   Eyes: Negative for visual disturbance.  Respiratory: Negative for shortness of breath.   Cardiovascular: Negative for chest pain.  Gastrointestinal: Negative for blood in stool.  Endocrine: Negative for cold intolerance.  Genitourinary: Negative for hematuria and difficulty urinating.  Musculoskeletal: Negative for back pain.  Skin: Negative for rash.  Allergic/Immunologic: Positive for environmental allergies.  Neurological: Negative for syncope and headaches.  Psychiatric/Behavioral: Negative for dysphoric mood.       Objective:   Physical Exam VS: see vs page GEN: no distress HEAD: head: no deformity eyes: no periorbital swelling, no proptosis external nose and ears are normal mouth: no lesion seen NECK: supple, thyroid is not  enlarged CHEST WALL: no deformity LUNGS: clear to auscultation BREASTS:  No gynecomastia CV: reg rate and rhythm, no murmur ABD: abdomen is soft, nontender.  no hepatosplenomegaly.  not distended.  no hernia. RECTAL: normal external and internal exam.  heme neg. PROSTATE:  Normal size.  No nodule MUSCULOSKELETAL: muscle bulk and strength are grossly normal.  no obvious joint swelling.  gait is normal and steady EXTEMITIES: no deformity.  no ulcer on the feet.  feet are of normal color and temp.  no edema PULSES: dorsalis pedis intact bilat.  no carotid bruit NEURO:  cn 2-12 grossly intact.   readily moves all 4's.  sensation is intact to touch on the feet SKIN:  Normal texture and temperature.  No rash or suspicious lesion is visible.   NODES:  None palpable at the neck PSYCH: alert, well-oriented.  Does not appear anxious nor depressed.       Assessment & Plan:  Wellness visit today, with problems stable, except as noted.   Patient is advised the following: Patient Instructions  check your blood sugar once a day.  vary the time of day when you check, between before the 3 meals, and at bedtime.  also check if you have symptoms of your blood sugar being too high or too low.  please keep a record of the readings and bring it to your next appointment here.  You can write it on any piece of paper.  please call us sooner if your blood sugar goes below 70, or if you have a lot of readings over 200. please consider these measures for your health:  minimize alcohol.  do not use tobacco products.  have a colonoscopy at least every 10 years from age 34.  keep firearms safely stored.  always use seat belts.  have working smoke alarms in your home.  see an eye doctor and dentist regularly.  never drive under the influence of alcohol or drugs (including prescription drugs).   Please come back for a follow-up appointment in 6 months.

## 2014-09-22 NOTE — Patient Instructions (Addendum)
check your blood sugar once a day.  vary the time of day when you check, between before the 3 meals, and at bedtime.  also check if you have symptoms of your blood sugar being too high or too low.  please keep a record of the readings and bring it to your next appointment here.  You can write it on any piece of paper.  please call us sooner if your blood sugar goes below 70, or if you have a lot of readings over 200. please consider these measures for your health:  minimize alcohol.  do not use tobacco products.  have a colonoscopy at least every 10 years from age 68.  keep firearms safely stored.  always use seat belts.  have working smoke alarms in your home.  see an eye doctor and dentist regularly.  never drive under the influence of alcohol or drugs (including prescription drugs).   Please come back for a follow-up appointment in 6 months.

## 2015-01-19 LAB — HM DIABETES EYE EXAM

## 2015-01-20 ENCOUNTER — Encounter: Payer: Self-pay | Admitting: Endocrinology

## 2015-05-17 ENCOUNTER — Emergency Department (HOSPITAL_COMMUNITY)
Admission: EM | Admit: 2015-05-17 | Discharge: 2015-05-18 | Disposition: A | Discharge status: Home / Self Care | Payer: Medicare Other | Attending: Emergency Medicine | Admitting: Emergency Medicine

## 2015-05-17 ENCOUNTER — Encounter (HOSPITAL_COMMUNITY): Payer: Self-pay | Admitting: Emergency Medicine

## 2015-05-17 DIAGNOSIS — K922 Gastrointestinal hemorrhage, unspecified: Secondary | ICD-10-CM | POA: Diagnosis not present

## 2015-05-17 DIAGNOSIS — E785 Hyperlipidemia, unspecified: Secondary | ICD-10-CM | POA: Diagnosis not present

## 2015-05-17 DIAGNOSIS — E119 Type 2 diabetes mellitus without complications: Secondary | ICD-10-CM | POA: Insufficient documentation

## 2015-05-17 DIAGNOSIS — I10 Essential (primary) hypertension: Secondary | ICD-10-CM | POA: Diagnosis not present

## 2015-05-17 DIAGNOSIS — Z87891 Personal history of nicotine dependence: Secondary | ICD-10-CM | POA: Insufficient documentation

## 2015-05-17 DIAGNOSIS — Z79899 Other long term (current) drug therapy: Secondary | ICD-10-CM | POA: Insufficient documentation

## 2015-05-17 DIAGNOSIS — K625 Hemorrhage of anus and rectum: Secondary | ICD-10-CM | POA: Diagnosis present

## 2015-05-17 DIAGNOSIS — K219 Gastro-esophageal reflux disease without esophagitis: Secondary | ICD-10-CM | POA: Diagnosis not present

## 2015-05-17 DIAGNOSIS — Z7984 Long term (current) use of oral hypoglycemic drugs: Secondary | ICD-10-CM | POA: Diagnosis not present

## 2015-05-17 DIAGNOSIS — Z86010 Personal history of colonic polyps: Secondary | ICD-10-CM | POA: Insufficient documentation

## 2015-05-17 DIAGNOSIS — K648 Other hemorrhoids: Secondary | ICD-10-CM | POA: Diagnosis not present

## 2015-05-17 DIAGNOSIS — Z872 Personal history of diseases of the skin and subcutaneous tissue: Secondary | ICD-10-CM | POA: Insufficient documentation

## 2015-05-17 DIAGNOSIS — Z7982 Long term (current) use of aspirin: Secondary | ICD-10-CM | POA: Insufficient documentation

## 2015-05-17 DIAGNOSIS — D509 Iron deficiency anemia, unspecified: Secondary | ICD-10-CM | POA: Diagnosis not present

## 2015-05-17 NOTE — ED Notes (Signed)
Pt states that he started having blood in his stool tonight. Hx of hemmrrhoids but no bleeding. Alert and oriented.

## 2015-05-18 LAB — CBC
HCT: 35.4 % — ABNORMAL LOW (ref 39.0–52.0)
HEMATOCRIT: 36.3 % — AB (ref 39.0–52.0)
HEMOGLOBIN: 11.9 g/dL — AB (ref 13.0–17.0)
HEMOGLOBIN: 12.2 g/dL — AB (ref 13.0–17.0)
MCH: 36 pg — ABNORMAL HIGH (ref 26.0–34.0)
MCH: 36.1 pg — AB (ref 26.0–34.0)
MCHC: 33.6 g/dL (ref 30.0–36.0)
MCHC: 33.6 g/dL (ref 30.0–36.0)
MCV: 106.9 fL — ABNORMAL HIGH (ref 78.0–100.0)
MCV: 107.4 fL — AB (ref 78.0–100.0)
PLATELETS: 163 10*3/uL (ref 150–400)
PLATELETS: 199 10*3/uL (ref 150–400)
RBC: 3.31 MIL/uL — AB (ref 4.22–5.81)
RBC: 3.38 MIL/uL — AB (ref 4.22–5.81)
RDW: 14.1 % (ref 11.5–15.5)
RDW: 14.1 % (ref 11.5–15.5)
WBC: 4.7 10*3/uL (ref 4.0–10.5)
WBC: 5.4 10*3/uL (ref 4.0–10.5)

## 2015-05-18 LAB — COMPREHENSIVE METABOLIC PANEL
ALT: 27 U/L (ref 17–63)
AST: 37 U/L (ref 15–41)
Albumin: 4.1 g/dL (ref 3.5–5.0)
Alkaline Phosphatase: 54 U/L (ref 38–126)
Anion gap: 11 (ref 5–15)
BUN: 23 mg/dL — ABNORMAL HIGH (ref 6–20)
CHLORIDE: 102 mmol/L (ref 101–111)
CO2: 25 mmol/L (ref 22–32)
CREATININE: 1.71 mg/dL — AB (ref 0.61–1.24)
Calcium: 8.9 mg/dL (ref 8.9–10.3)
GFR calc non Af Amer: 41 mL/min — ABNORMAL LOW (ref >60)
GFR, EST AFRICAN AMERICAN: 47 mL/min — AB (ref >60)
Glucose, Bld: 149 mg/dL — ABNORMAL HIGH (ref 65–99)
POTASSIUM: 4 mmol/L (ref 3.5–5.1)
SODIUM: 138 mmol/L (ref 135–145)
Total Bilirubin: 1.6 mg/dL — ABNORMAL HIGH (ref 0.3–1.2)
Total Protein: 7.3 g/dL (ref 6.5–8.1)

## 2015-05-18 LAB — TYPE AND SCREEN
ABO/RH(D): O POS
Antibody Screen: NEGATIVE

## 2015-05-18 LAB — ABO/RH: ABO/RH(D): O POS

## 2015-05-18 LAB — POC OCCULT BLOOD, ED: FECAL OCCULT BLD: POSITIVE — AB

## 2015-05-18 NOTE — ED Provider Notes (Signed)
CSN: [id number]     Arrival date & time 05/17/15  2327 History   First MD Initiated Contact with Patient 05/18/15 [phone]     Chief Complaint  Patient presents with  . Rectal Bleeding     (Consider location/radiation/quality/duration/timing/severity/associated sxs/prior Treatment) Patient is a 65 y.o. male presenting with hematochezia. The history is provided by the patient.  Rectal Bleeding Quality:  Bright red Amount:  Moderate Duration:  10 hours Timing:  Sporadic Chronicity:  New Context: hemorrhoids and spontaneously   Context: not defecation, not rectal injury and not rectal pain   Similar prior episodes: no   Associated symptoms: no abdominal pain, no dizziness, no hematemesis, no light-headedness and no loss of consciousness   Risk factors: no anticoagulant use, no liver disease, no NSAID use and no steroid use     Past Medical History  Diagnosis Date  . ANEMIA, IRON DEFICIENCY 12/23/2009  . DIABETES MELLITUS, TYPE II 12/30/2006  . ECZEMA 05/15/2009  . GERD 12/30/2006  . HYPERLIPIDEMIA 12/30/2006  . HYPERTENSION 12/30/2006  . HYPERTHYROIDISM 06/03/2008  . Pernicious anemia 12/30/2006  . Personal history of colonic polyps - adenoma 07/22/2004   Past Surgical History  Procedure Laterality Date  . Transurethral resection of prostate    . Esophagogastroduodenoscopy  multiple  . Colonoscopy  multiple   Family History  Problem Relation Age of Onset  . Heart attack Father     MI  . Depression Son   . COPD Son   . Cancer Neg Hx   . Colon cancer Neg Hx    Social History  Substance Use Topics  . Smoking status: Former Research scientist (life sciences)  . Smokeless tobacco: None  . Alcohol Use: 6.0 oz/week    10 Glasses of wine per week    Review of Systems  Gastrointestinal: Positive for hematochezia. Negative for abdominal pain and hematemesis.  Neurological: Negative for dizziness, loss of consciousness and light-headedness.  All other systems reviewed and are negative.     Allergies   Review of patient's allergies indicates no known allergies.  Home Medications   Prior to Admission medications   Medication Sig Start Date End Date Taking? Authorizing Provider  aspirin EC 81 MG tablet Take 81 mg by mouth daily.   Yes Historical Provider, MD  bimatoprost (LUMIGAN) 0.01 % SOLN Place 1 drop into both eyes at bedtime. 05/22/13  Yes Renato Shin, MD  bromocriptine (PARLODEL) 2.5 MG tablet Take 1 tablet (2.5 mg total) by mouth at bedtime. 05/23/14  Yes Renato Shin, MD  calcitRIOL (ROCALTROL) 0.25 MCG capsule Take 1 capsule (0.25 mcg total) by mouth daily. Take Monday Wednesday and Friday. 05/22/13  Yes Renato Shin, MD  carvedilol (COREG) 3.125 MG tablet TAKE 1 TABLET TWICE A DAY 05/23/14  Yes Renato Shin, MD  chlorhexidine (PERIDEX) 0.12 % solution 7.5 mLs by Mouth Rinse route as directed. DAILY TO CLEAN DENTAL IMPLANTS. 04/22/15  Yes Historical Provider, MD  cyanocobalamin (,VITAMIN B-12,) 1000 MCG/ML injection INJECT 1ML INTRAMUSCULARY ONCE A MONTH 02/24/14  Yes Renato Shin, MD  ezetimibe-simvastatin (VYTORIN) 10-80 MG per tablet TAKE 1 TABLET BY MOUTH EVERY DAY 05/23/14  Yes Renato Shin, MD  ferrous sulfate 325 (65 FE) MG tablet Take 1 tablet (325 mg total) by mouth daily with breakfast. 05/23/14  Yes Renato Shin, MD  hydrochlorothiazide (HYDRODIURIL) 25 MG tablet Take 1 tablet (25 mg total) by mouth daily. 05/23/14  Yes Renato Shin, MD  omeprazole (PRILOSEC OTC) 20 MG tablet Take 1 tablet (20 mg total) by mouth  daily. 05/23/14  Yes Renato Shin, MD  pioglitazone (ACTOS) 45 MG tablet TAKE 1 TABLET BY MOUTH EVERY DAY 05/23/14  Yes Renato Shin, MD  saxagliptin HCl (ONGLYZA) 5 MG TABS tablet TAKE 1 TABLET BY MOUTH EVERY MORNING 05/23/14  Yes Renato Shin, MD  sildenafil (REVATIO) 20 MG tablet 2-5 pills as needed for ED symptoms 09/22/14  Yes Renato Shin, MD  SYRINGE-NEEDLE, DISP, 3 ML (BD INTEGRA SYRINGE) 25G X 1" 3 ML MISC USE 1 SYRINGE ONCE A MONTH AS DIRECTED 05/22/13  Yes Renato Shin, MD  aspirin 81 MG tablet Take 1 tablet (81 mg total) by mouth daily. Patient not taking: Reported on 05/18/2015 05/22/13   Renato Shin, MD   BP 154/97 mmHg  Pulse 110  Temp(Src) 98.6 F (37 C) (Oral)  Resp 18  SpO2 100% Physical Exam  Constitutional: He is oriented to person, place, and time. He appears well-developed.  HENT:  Head: Atraumatic.  Neck: Neck supple.  Cardiovascular: Normal rate.   Pulmonary/Chest: Effort normal.  Abdominal: He exhibits no distension. There is no tenderness.  Neurological: He is alert and oriented to person, place, and time.  Skin: Skin is warm.  Nursing note and vitals reviewed.   ED Course  Procedures (including critical care time) Labs Review Labs Reviewed  COMPREHENSIVE METABOLIC PANEL - Abnormal; Notable for the following:    Glucose, Bld 149 (*)    BUN 23 (*)    Creatinine, Ser 1.71 (*)    Total Bilirubin 1.6 (*)    GFR calc non Af Amer 41 (*)    GFR calc Af Amer 47 (*)    All other components within normal limits  CBC - Abnormal; Notable for the following:    RBC 3.38 (*)    Hemoglobin 12.2 (*)    HCT 36.3 (*)    MCV 107.4 (*)    MCH 36.1 (*)    All other components within normal limits  CBC - Abnormal; Notable for the following:    RBC 3.31 (*)    Hemoglobin 11.9 (*)    HCT 35.4 (*)    MCV 106.9 (*)    MCH 36.0 (*)    All other components within normal limits  POC OCCULT BLOOD, ED - Abnormal; Notable for the following:    Fecal Occult Bld POSITIVE (*)    All other components within normal limits  TYPE AND SCREEN  ABO/RH    Imaging Review No results found. I have personally reviewed and evaluated these images and lab results as part of my medical decision-making.   EKG Interpretation None      MDM   Final diagnoses:  Lower GI bleed  Internal hemorrhoid, bleeding    DDx includes: Esophagitis Mallory Weiss tear Variceal bleeding PUD/Gastritis/ulcers Diverticular bleed Colon cancer Rectal  bleed Internal hemorrhoids External hemorrhoids     Pt with GI bleed. Likely from internal hemorrhoid or diverticulosis - as he has both of them per 05/2012 colonoscopy. Pt not on any anticoagulation. Pt is asymptomatic. We will get repeat cbc, if it is stable, will d./c with strict return precautions.    Varney Biles, MD 05/18/15 [phone]

## 2015-05-18 NOTE — Discharge Instructions (Signed)
You are having a lower GI bleed - likely from diverticular disease or internal hemorrhoid. We anticipate that the bleeding will cease on it's own in 1 or 2 days. Please return to the ER if the symptoms get worse, you get short of breath or start having fainting like feeling.   Gastrointestinal Bleeding Gastrointestinal (GI) bleeding means there is bleeding somewhere along the digestive tract, between the mouth and anus. CAUSES  There are many different problems that can cause GI bleeding. Possible causes include:  Esophagitis. This is inflammation, irritation, or swelling of the esophagus.  Hemorrhoids.These are veins that are full of blood (engorged) in the rectum. They cause pain, inflammation, and may bleed.  Anal fissures.These are areas of painful tearing which may bleed. They are often caused by passing hard stool.  Diverticulosis.These are pouches that form on the colon over time, with age, and may bleed significantly.  Diverticulitis.This is inflammation in areas with diverticulosis. It can cause pain, fever, and bloody stools, although bleeding is rare.  Polyps and cancer. Colon cancer often starts out as precancerous polyps.  Gastritis and ulcers.Bleeding from the upper gastrointestinal tract (near the stomach) may travel through the intestines and produce black, sometimes tarry, often bad smelling stools. In certain cases, if the bleeding is fast enough, the stools may not be black, but red. This condition may be life-threatening. SYMPTOMS   Vomiting bright red blood or material that looks like coffee grounds.  Bloody, black, or tarry stools. DIAGNOSIS  Your caregiver may diagnose your condition by taking your history and performing a physical exam. More tests may be needed, including:  X-rays and other imaging tests.  Esophagogastroduodenoscopy (EGD). This test uses a flexible, lighted tube to look at your esophagus, stomach, and small intestine.  Colonoscopy. This  test uses a flexible, lighted tube to look at your colon. TREATMENT  Treatment depends on the cause of your bleeding.   For bleeding from the esophagus, stomach, small intestine, or colon, the caregiver doing your EGD or colonoscopy may be able to stop the bleeding as part of the procedure.  Inflammation or infection of the colon can be treated with medicines.  Many rectal problems can be treated with creams, suppositories, or warm baths.  Surgery is sometimes needed.  Blood transfusions are sometimes needed if you have lost a lot of blood. If bleeding is slow, you may be allowed to go home. If there is a lot of bleeding, you will need to stay in the hospital for observation. HOME CARE INSTRUCTIONS   Take any medicines exactly as prescribed.  Keep your stools soft by eating foods that are high in fiber. These foods include whole grains, legumes, fruits, and vegetables. Prunes (1 to 3 a day) work well for many people.  Drink enough fluids to keep your urine clear or pale yellow. SEEK IMMEDIATE MEDICAL CARE IF:   Your bleeding increases.  You feel lightheaded, weak, or you faint.  You have severe cramps in your back or abdomen. You pass large blood clots in yDiverticulosis Diverticulosis is the condition that develops when small pouches (diverticula) form in the wall of your colon. Your colon, or large intestine, is where water is absorbed and stool is formed. The pouches form when the inside layer of your colon pushes through weak spots in the outer layers of your colon. CAUSES  No one knows exactly what causes diverticulosis. RISK FACTORS Being older than 40. Your risk for this condition increases with age. Diverticulosis is rare in  people younger than 40 years. By age 51, almost everyone has it. Eating a low-fiber diet. Being frequently constipated. Being overweight. Not getting enough exercise. Smoking. Taking over-the-counter pain medicines, like aspirin and  ibuprofen. SYMPTOMS  Most people with diverticulosis do not have symptoms. DIAGNOSIS  Because diverticulosis often has no symptoms, health care providers often discover the condition during an exam for other colon problems. In many cases, a health care provider will diagnose diverticulosis while using a flexible scope to examine the colon (colonoscopy). TREATMENT  If you have never developed an infection related to diverticulosis, you may not need treatment. If you have had an infection before, treatment may include: Eating more fruits, vegetables, and grains. Taking a fiber supplement. Taking a live bacteria supplement (probiotic). Taking medicine to relax your colon. HOME CARE INSTRUCTIONS  Drink at least 6-8 glasses of water each day to prevent constipation. Try not to strain when you have a bowel movement. Keep all follow-up appointments. If you have had an infection before: Increase the fiber in your diet as directed by your health care provider or dietitian. Take a dietary fiber supplement if your health care provider approves. Only take medicines as directed by your health care provider. SEEK MEDICAL CARE IF:  You have abdominal pain. You have bloating. You have cramps. You have not gone to the bathroom in 3 days. SEEK IMMEDIATE MEDICAL CARE IF:  Your pain gets worse. Yourbloating becomes very bad. You have a fever or chills, and your symptoms suddenly get worse. You begin vomiting. You have bowel movements that are bloody or black. MAKE SURE YOU: Understand these instructions. Will watch your condition. Will get help right away if you are not doing well or get worse.   This information is not intended to replace advice given to you by your health care provider. Make sure you discuss any questions you have with your health care provider.   Document Released: 12/10/2003 Document Revised: 03/19/2013 Document Reviewed: 02/06/2013 Elsevier Interactive Patient Education  [employer].  our stool.  Your problems are getting worse. MAKE SURE YOU:   Understand these instructions.  Will watch your condition.  Will get help right away if you are not doing well or get worse.   This information is not intended to replace advice given to you by your health care provider. Make sure you discuss any questions you have with your health care provider.   Document Released: 03/11/2000 Document Revised: 02/29/2012 Document Reviewed: 09/01/2014 Elsevier Interactive Patient Education [employer].

## 2015-05-26 ENCOUNTER — Encounter: Payer: Self-pay | Admitting: Endocrinology

## 2015-05-26 ENCOUNTER — Ambulatory Visit (INDEPENDENT_AMBULATORY_CARE_PROVIDER_SITE_OTHER): Payer: Medicare Other | Admitting: Endocrinology

## 2015-05-26 VITALS — BP 122/86 | HR 90 | Temp 98.2°F | Ht 75.0 in | Wt 260.0 lb

## 2015-05-26 DIAGNOSIS — E1122 Type 2 diabetes mellitus with diabetic chronic kidney disease: Secondary | ICD-10-CM

## 2015-05-26 DIAGNOSIS — K921 Melena: Secondary | ICD-10-CM

## 2015-05-26 DIAGNOSIS — E0580 Other thyrotoxicosis without thyrotoxic crisis or storm: Secondary | ICD-10-CM

## 2015-05-26 DIAGNOSIS — Z0000 Encounter for general adult medical examination without abnormal findings: Secondary | ICD-10-CM | POA: Diagnosis not present

## 2015-05-26 DIAGNOSIS — Z23 Encounter for immunization: Secondary | ICD-10-CM | POA: Diagnosis not present

## 2015-05-26 DIAGNOSIS — Z125 Encounter for screening for malignant neoplasm of prostate: Secondary | ICD-10-CM

## 2015-05-26 DIAGNOSIS — R7989 Other specified abnormal findings of blood chemistry: Secondary | ICD-10-CM

## 2015-05-26 DIAGNOSIS — D509 Iron deficiency anemia, unspecified: Secondary | ICD-10-CM

## 2015-05-26 DIAGNOSIS — Z86010 Personal history of colonic polyps: Secondary | ICD-10-CM

## 2015-05-26 DIAGNOSIS — N183 Chronic kidney disease, stage 3 (moderate): Secondary | ICD-10-CM

## 2015-05-26 LAB — LDL CHOLESTEROL, DIRECT: LDL DIRECT: 31 mg/dL

## 2015-05-26 LAB — CBC WITH DIFFERENTIAL/PLATELET
BASOS PCT: 0.5 % (ref 0.0–3.0)
Basophils Absolute: 0 10*3/uL (ref 0.0–0.1)
EOS PCT: 1 % (ref 0.0–5.0)
Eosinophils Absolute: 0 10*3/uL (ref 0.0–0.7)
HEMATOCRIT: 35 % — AB (ref 39.0–52.0)
Hemoglobin: 12 g/dL — ABNORMAL LOW (ref 13.0–17.0)
LYMPHS ABS: 1.3 10*3/uL (ref 0.7–4.0)
Lymphocytes Relative: 37.9 % (ref 12.0–46.0)
MCHC: 34.3 g/dL (ref 30.0–36.0)
MCV: 104.7 fl — AB (ref 78.0–100.0)
MONOS PCT: 12.3 % — AB (ref 3.0–12.0)
Monocytes Absolute: 0.4 10*3/uL (ref 0.1–1.0)
NEUTROS ABS: 1.7 10*3/uL (ref 1.4–7.7)
NEUTROS PCT: 48.3 % (ref 43.0–77.0)
PLATELETS: 227 10*3/uL (ref 150.0–400.0)
RBC: 3.34 Mil/uL — ABNORMAL LOW (ref 4.22–5.81)
RDW: 14.7 % (ref 11.5–15.5)
WBC: 3.5 10*3/uL — ABNORMAL LOW (ref 4.0–10.5)

## 2015-05-26 LAB — LIPID PANEL
CHOL/HDL RATIO: 2
Cholesterol: 175 mg/dL (ref 0–200)
HDL: 104.1 mg/dL (ref >39.00)
NonHDL: 71.22
Triglycerides: 276 mg/dL — ABNORMAL HIGH (ref 0.0–149.0)
VLDL: 55.2 mg/dL — ABNORMAL HIGH (ref 0.0–40.0)

## 2015-05-26 LAB — BASIC METABOLIC PANEL
BUN: 16 mg/dL (ref 6–23)
CO2: 29 meq/L (ref 19–32)
CREATININE: 1.29 mg/dL (ref 0.40–1.50)
Calcium: 9.1 mg/dL (ref 8.4–10.5)
Chloride: 101 mEq/L (ref 96–112)
GFR: 71.88 mL/min (ref >60.00)
Glucose, Bld: 114 mg/dL — ABNORMAL HIGH (ref 70–99)
Potassium: 4.4 mEq/L (ref 3.5–5.1)
Sodium: 139 mEq/L (ref 135–145)

## 2015-05-26 LAB — TSH: TSH: 0.42 u[IU]/mL (ref 0.35–4.50)

## 2015-05-26 LAB — MICROALBUMIN / CREATININE URINE RATIO
Creatinine,U: 197.4 mg/dL
Microalb Creat Ratio: 1.2 mg/g (ref 0.0–30.0)
Microalb, Ur: 2.3 mg/dL — ABNORMAL HIGH (ref 0.0–1.9)

## 2015-05-26 LAB — PSA: PSA: 0.48 ng/mL (ref 0.10–4.00)

## 2015-05-26 LAB — IBC PANEL
Iron: 239 ug/dL — ABNORMAL HIGH (ref 42–165)
Saturation Ratios: 80.1 % — ABNORMAL HIGH (ref 20.0–50.0)
Transferrin: 213 mg/dL (ref 212.0–360.0)

## 2015-05-26 LAB — POCT GLYCOSYLATED HEMOGLOBIN (HGB A1C): HEMOGLOBIN A1C: 5.7

## 2015-05-26 MED ORDER — LINAGLIPTIN 5 MG PO TABS: 5.0000 mg | ORAL_TABLET | Freq: Every day | ORAL | Status: DC

## 2015-05-26 NOTE — Patient Instructions (Addendum)
Please see a gastroenterology specialist.  you will receive a phone call, about a day and time for an appointment. blood tests are requested for you today.  We'll let you know about the results. please consider these measures for your health:  minimize alcohol.  do not use tobacco products.  have a colonoscopy at least every 10 years from age 66. keep firearms safely stored.  always use seat belts.  have working smoke alarms in your home.  see an eye doctor and dentist regularly.  never drive under the influence of alcohol or drugs (including prescription drugs).  it is critically important to prevent falling down (keep floor areas well-lit, dry, and free of loose objects.  If you have a cane, walker, or wheelchair, you should use it, even for short trips around the house.  Also, try not to rush) i have sent a prescription to your pharmacy, to change onglyza to "tradjenta."  Here is a discount card.   Please come back for a follow-up appointment in 6 months

## 2015-05-26 NOTE — Progress Notes (Signed)
we discussed code status.  pt requests full code, but would not want to be started or maintained on artificial life-support measures if there was not a reasonable chance of recovery 

## 2015-05-26 NOTE — Progress Notes (Signed)
Subjective:    Patient ID: Mitchell Alvarado, male    DOB: Dec 26, 1950, 65 y.o.   MRN: [id number]  HPI Pt is here for regular wellness examination, and is feeling pretty well in general, and says chronic med probs are stable, except as noted below Past Medical History  Diagnosis Date  . ANEMIA, IRON DEFICIENCY 12/23/2009  . DIABETES MELLITUS, TYPE II 12/30/2006  . ECZEMA 05/15/2009  . GERD 12/30/2006  . HYPERLIPIDEMIA 12/30/2006  . HYPERTENSION 12/30/2006  . HYPERTHYROIDISM 06/03/2008  . Pernicious anemia 12/30/2006  . Personal history of colonic polyps - adenoma 07/22/2004    Past Surgical History  Procedure Laterality Date  . Transurethral resection of prostate    . Esophagogastroduodenoscopy  multiple  . Colonoscopy  multiple    Social History   Social History  . Marital Status: Married    Spouse Name: N/A  . Number of Children: N/A  . Years of Education: N/A   Occupational History  . Retired from school system    Social History Main Topics  . Smoking status: Former Research scientist (life sciences)  . Smokeless tobacco: Not on file  . Alcohol Use: 6.0 oz/week    10 Glasses of wine per week  . Drug Use: No  . Sexual Activity: Not on file   Other Topics Concern  . Not on file   Social History Narrative    Current Outpatient Prescriptions on File Prior to Visit  Medication Sig Dispense Refill  . aspirin 81 MG tablet Take 1 tablet (81 mg total) by mouth daily. 90 tablet 3  . aspirin EC 81 MG tablet Take 81 mg by mouth daily.    . bimatoprost (LUMIGAN) 0.01 % SOLN Place 1 drop into both eyes at bedtime. 5 mL 2  . chlorhexidine (PERIDEX) 0.12 % solution 7.5 mLs by Mouth Rinse route as directed. DAILY TO CLEAN DENTAL IMPLANTS.  99  . sildenafil (REVATIO) 20 MG tablet 2-5 pills as needed for ED symptoms 50 tablet 10  . SYRINGE-NEEDLE, DISP, 3 ML (BD INTEGRA SYRINGE) 25G X 1" 3 ML MISC USE 1 SYRINGE ONCE A MONTH AS DIRECTED 3 each 3   No current facility-administered medications on file prior to  visit.    No Known Allergies  Family History  Problem Relation Age of Onset  . Heart attack Father     MI  . Depression Son   . COPD Son   . Cancer Neg Hx   . Colon cancer Neg Hx     BP 122/86 mmHg  Pulse 90  Temp(Src) 98.2 F (36.8 C) (Oral)  Ht 6\' 3"  (1.905 m)  Wt 260 lb (117.935 kg)  BMI 32.50 kg/m2  SpO2 99%  Review of Systems  Constitutional: Negative for fever.  HENT: Negative for hearing loss.   Eyes: Negative for visual disturbance.  Respiratory: Negative for shortness of breath.   Cardiovascular: Negative for chest pain.  Gastrointestinal: Negative for abdominal pain.  Endocrine: Negative for cold intolerance.  Musculoskeletal: Negative for back pain.  Skin: Negative for rash.  Allergic/Immunologic: Positive for environmental allergies.  Neurological: Positive for numbness. Negative for syncope.  Hematological: Bruises/bleeds easily.  Psychiatric/Behavioral: Negative for dysphoric mood.       Objective:   Physical Exam VS: see vs page GEN: no distress HEAD: head: no deformity eyes: no periorbital swelling, no proptosis external nose and ears are normal mouth: no lesion seen NECK: supple, thyroid is not enlarged.  CHEST WALL: no deformity LUNGS: clear to auscultation BREASTS:  No gynecomastia CV: reg rate and rhythm, no murmur RECTAL/PROSTATE: declined MUSCULOSKELETAL: muscle bulk and strength are grossly normal.  no obvious joint swelling.  gait is normal and steady EXTEMITIES: no deformity.  no ulcer on the feet.  feet are of normal color and temp.  no edema PULSES: dorsalis pedis intact bilat.  no carotid bruit NEURO:  cn 2-12 grossly intact.   readily moves all 4's.  sensation is intact to touch on the feet SKIN:  Normal texture and temperature.  No rash or suspicious lesion is visible.   NODES:  None palpable at the neck PSYCH: alert, well-oriented.  Does not appear anxious nor depressed.       Assessment & Plan:  Wellness visit today,  with problems stable, except as noted.   SEPARATE EVALUATION FOLLOWS--EACH PROBLEM HERE IS NEW, NOT RESPONDING TO TREATMENT, OR POSES SIGNIFICANT RISK TO THE PATIENT'S HEALTH: HISTORY OF THE PRESENT ILLNESS: He was in ER last week for hematochezia and BRBPR.  sxs are much better, but nausea persists.  He takes fe 1/day PAST MEDICAL HISTORY reviewed and up to date today REVIEW OF SYSTEMS: Denies hematuria PHYSICAL EXAMINATION: VITAL SIGNS:  See vs page GENERAL: no distress ABD: abdomen is soft, nontender.  no hepatosplenomegaly.  not distended.  Self-reducing ventral hernia LAB/XRAY RESULTS: i personally reviewed electrocardiogram tracing (today): Indication: hematochezia Impression: normal Lab Results  Component Value Date   WBC 3.5* 05/26/2015   HGB 12.0* 05/26/2015   HCT 35.0* 05/26/2015   MCV 104.7* 05/26/2015   PLT 227.0 05/26/2015  IMPRESSION: GI bleed. Clinically better, but he needs GI f/u PLAN:  Please see a gastroenterology specialist.  you will receive a phone call, about a day and time for an appointment. D/c fe

## 2015-05-27 ENCOUNTER — Telehealth: Payer: Self-pay

## 2015-05-27 ENCOUNTER — Other Ambulatory Visit: Payer: Self-pay

## 2015-05-27 MED ORDER — SAXAGLIPTIN HCL 5 MG PO TABS: ORAL_TABLET | ORAL | Status: DC

## 2015-05-27 MED ORDER — CALCITRIOL 0.25 MCG PO CAPS: 0.2500 ug | ORAL_CAPSULE | Freq: Every day | ORAL | Status: DC

## 2015-05-27 MED ORDER — EZETIMIBE-SIMVASTATIN 10-80 MG PO TABS: ORAL_TABLET | ORAL | Status: DC

## 2015-05-27 MED ORDER — CYANOCOBALAMIN 1000 MCG/ML IJ SOLN: INTRAMUSCULAR | Status: DC

## 2015-05-27 MED ORDER — CARVEDILOL 3.125 MG PO TABS: ORAL_TABLET | ORAL | Status: DC

## 2015-05-27 MED ORDER — HYDROCHLOROTHIAZIDE 25 MG PO TABS: 25.0000 mg | ORAL_TABLET | Freq: Every day | ORAL | Status: DC

## 2015-05-27 MED ORDER — OMEPRAZOLE MAGNESIUM 20 MG PO TBEC: 20.0000 mg | DELAYED_RELEASE_TABLET | Freq: Every day | ORAL | Status: DC

## 2015-05-27 MED ORDER — BROMOCRIPTINE MESYLATE 2.5 MG PO TABS: 2.5000 mg | ORAL_TABLET | Freq: Every day | ORAL | Status: DC

## 2015-05-27 MED ORDER — FERROUS SULFATE 325 (65 FE) MG PO TABS: 325.0000 mg | ORAL_TABLET | Freq: Every day | ORAL | Status: DC

## 2015-05-27 MED ORDER — PIOGLITAZONE HCL 45 MG PO TABS: ORAL_TABLET | ORAL | Status: DC

## 2015-05-27 NOTE — Telephone Encounter (Signed)
Pt called stating the tradjenta co-payment is too expensive. Pt is requesting to be put back on onglyza.

## 2015-05-27 NOTE — Telephone Encounter (Signed)
Ok, done 

## 2015-06-01 ENCOUNTER — Encounter: Payer: Self-pay | Admitting: Internal Medicine

## 2015-07-13 ENCOUNTER — Ambulatory Visit (AMBULATORY_SURGERY_CENTER): Payer: Self-pay | Admitting: *Deleted

## 2015-07-13 VITALS — Ht 75.0 in | Wt 264.0 lb

## 2015-07-13 DIAGNOSIS — Z86010 Personal history of colonic polyps: Secondary | ICD-10-CM

## 2015-07-13 NOTE — Progress Notes (Signed)
Patient denies any allergies to eggs or soy. Patient denies any problems with anesthesia/sedation. Patient denies any oxygen use at home and does not take any diet/weight loss medications. Patient declined EMMI education. 

## 2015-07-24 ENCOUNTER — Encounter: Payer: Self-pay | Admitting: Internal Medicine

## 2015-07-27 ENCOUNTER — Ambulatory Visit (AMBULATORY_SURGERY_CENTER): Payer: Medicare Other | Admitting: Internal Medicine

## 2015-07-27 ENCOUNTER — Encounter: Payer: Self-pay | Admitting: Internal Medicine

## 2015-07-27 VITALS — BP 141/71 | HR 60 | Temp 96.6°F | Resp 10 | Ht 75.0 in | Wt 260.0 lb

## 2015-07-27 DIAGNOSIS — Z86010 Personal history of colonic polyps: Secondary | ICD-10-CM | POA: Diagnosis present

## 2015-07-27 DIAGNOSIS — D123 Benign neoplasm of transverse colon: Secondary | ICD-10-CM

## 2015-07-27 HISTORY — PX: COLONOSCOPY WITH PROPOFOL: SHX5780

## 2015-07-27 LAB — GLUCOSE, CAPILLARY
GLUCOSE-CAPILLARY: 115 mg/dL — AB (ref 65–99)
Glucose-Capillary: 143 mg/dL — ABNORMAL HIGH (ref 65–99)

## 2015-07-27 MED ORDER — SODIUM CHLORIDE 0.9 % IV SOLN: 500.0000 mL | INTRAVENOUS | Status: DC

## 2015-07-27 NOTE — Progress Notes (Signed)
No problems noted in the recovery room. maw 

## 2015-07-27 NOTE — Progress Notes (Signed)
Called to room to assist during endoscopic procedure.  Patient ID and intended procedure confirmed with present staff. Received instructions for my participation in the procedure from the performing physician.  

## 2015-07-27 NOTE — Op Note (Addendum)
[facility] Patient Name: Mitchell Alvarado Procedure Date: 07/27/2015 9:12 AM MRN: [id number] Endoscopist: Gatha Mayer , MD Age: 65 Date of Birth: [birth year] Gender: Male Procedure:                Colonoscopy Indications:              High risk colon cancer surveillance: Personal                            history of colonic polyps Medicines:                Propofol per Anesthesia, Monitored Anesthesia Care Procedure:                Pre-Anesthesia Assessment:                           - Prior to the procedure, a History and Physical                            was performed, and patient medications and                            allergies were reviewed. The patient's tolerance of                            previous anesthesia was also reviewed. The risks                            and benefits of the procedure and the sedation                            options and risks were discussed with the patient.                            All questions were answered, and informed consent                            was obtained. ASA Grade Assessment: II - A patient                            with mild systemic disease. After reviewing the                            risks and benefits, the patient was deemed in                            satisfactory condition to undergo the procedure.                           After obtaining informed consent, the colonoscope                            was passed under direct vision. Throughout the  procedure, the patient's blood pressure, pulse, and                            oxygen saturations were monitored continuously. The                            Model CF-HQ190L [id number]) scope was introduced                            through the anus and advanced to the the cecum,                            identified by appendiceal orifice and ileocecal                            valve. Anatomical landmarks were photographed. The                             quality of the bowel preparation was excellent. The                            bowel preparation used was Miralax. Scope In: 9:28:11 AM Scope Out: 9:41:08 AM Scope Withdrawal Time: 0 hours 9 minutes 32 seconds  Total Procedure Duration: 0 hours 12 minutes 57 seconds  Findings:                 Two sessile polyps were found in the transverse                            colon. The polyps were 2 to 3 mm in size. These                            polyps were removed with a cold biopsy forceps.                            Resection and retrieval were complete. Verification                            of patient identification for the specimen was done.                           Multiple diverticula were found in the sigmoid                            colon. There was no evidence of diverticular                            bleeding.                           The exam was otherwise without abnormality on                            direct and retroflexion views. Complications:  No immediate complications. Estimated blood loss:                            None. Estimated Blood Loss:     Estimated blood loss: none. Impression:               - Two 2 to 3 mm polyps in the transverse colon,                            removed with a cold biopsy forceps. Resected and                            retrieved.                           - Diverticulosis in the sigmoid colon. There was no                            evidence of diverticular bleeding.                           - The examination was otherwise normal on direct                            and retroflexion views. Recommendation:           - Repeat colonoscopy date to be determined after                            pending pathology results are reviewed for                            surveillance. HX 10 MM ADENOMA 2013 AND 5 MM                            ADENOMA 2006                           - Patient has a contact  number available for                            emergencies. The signs and symptoms of potential                            delayed complications were discussed with the                            patient. Return to normal activities tomorrow.                            Written discharge instructions were provided to the                            patient.                           -  Resume previous diet.                           - Continue present medications. Gatha Mayer, MD 07/27/2015 9:50:29 AM This report has been signed electronically. Addendum Number: 1   Addendum Date: 07/27/2015 3:23:23 PM      normal perianal and rectal exams including prostate. Gatha Mayer, MD 07/27/2015 3:23:41 PM This report has been signed electronically.

## 2015-07-27 NOTE — Progress Notes (Signed)
Report given to PACU RN, vss 

## 2015-07-27 NOTE — Patient Instructions (Addendum)
I found and removed 2 tiny polyps -  You still have a condition called diverticulosis - common and not usually a problem. Please read the handout provided.  I will let you know pathology results and when to have another routine colonoscopy by mail.  I appreciate the opportunity to care for you. Gatha Mayer, MD, FACG      YOU HAD AN ENDOSCOPIC PROCEDURE TODAY AT [facility]:   Refer to the procedure report that was given to you for any specific questions about what was found during the examination.  If the procedure report does not answer your questions, please call your gastroenterologist to clarify.  If you requested that your care partner not be given the details of your procedure findings, then the procedure report has been included in a sealed envelope for you to review at your convenience later.  YOU SHOULD EXPECT: Some feelings of bloating in the abdomen. Passage of more gas than usual.  Walking can help get rid of the air that was put into your GI tract during the procedure and reduce the bloating. If you had a lower endoscopy (such as a colonoscopy or flexible sigmoidoscopy) you may notice spotting of blood in your stool or on the toilet paper. If you underwent a bowel prep for your procedure, you may not have a normal bowel movement for a few days.  Please Note:  You might notice some irritation and congestion in your nose or some drainage.  This is from the oxygen used during your procedure.  There is no need for concern and it should clear up in a day or so.  SYMPTOMS TO REPORT IMMEDIATELY:   Following lower endoscopy (colonoscopy or flexible sigmoidoscopy):  Excessive amounts of blood in the stool  Significant tenderness or worsening of abdominal pains  Swelling of the abdomen that is new, acute  Fever of 100F or higher   For urgent or emergent issues, a gastroenterologist can be reached at any hour by calling [phone].   DIET: Your first  meal following the procedure should be a small meal and then it is ok to progress to your normal diet. Heavy or fried foods are harder to digest and may make you feel nauseous or bloated.  Likewise, meals heavy in dairy and vegetables can increase bloating.  Drink plenty of fluids but you should avoid alcoholic beverages for 24 hours.  ACTIVITY:  You should plan to take it easy for the rest of today and you should NOT DRIVE or use heavy machinery until tomorrow (because of the sedation medicines used during the test).    FOLLOW UP: Our staff will call the number listed on your records the next business day following your procedure to check on you and address any questions or concerns that you may have regarding the information given to you following your procedure. If we do not reach you, we will leave a message.  However, if you are feeling well and you are not experiencing any problems, there is no need to return our call.  We will assume that you have returned to your regular daily activities without incident.  If any biopsies were taken you will be contacted by phone or by letter within the next 1-3 weeks.  Please call us at [phone] if you have not heard about the biopsies in 3 weeks.    SIGNATURES/CONFIDENTIALITY: You and/or your care partner have signed paperwork which will be entered into your electronic medical  record.  These signatures attest to the fact that that the information above on your After Visit Summary has been reviewed and is understood.  Full responsibility of the confidentiality of this discharge information lies with you and/or your care-partner.    Handouts were given to your care partner on polyps and diverticulosis. Your blood sugar was 143 in the recovery room. You may resume your current medications today. Await biopsy results. Please call if any questions or concerns.

## 2015-07-28 ENCOUNTER — Telehealth: Payer: Self-pay | Admitting: *Deleted

## 2015-07-28 ENCOUNTER — Ambulatory Visit: Payer: 59 | Admitting: Internal Medicine

## 2015-07-28 NOTE — Telephone Encounter (Signed)
  Follow up Call-  Call back number 07/27/2015  Post procedure Call Back phone  # [phone]  Permission to leave phone message Yes     Patient questions:  Do you have a fever, pain , or abdominal swelling? No. Pain Score  0 *  Have you tolerated food without any problems? Yes.    Have you been able to return to your normal activities? Yes.    Do you have any questions about your discharge instructions: Diet   No. Medications  No. Follow up visit  No.  Do you have questions or concerns about your Care? No.  Actions: * If pain score is 4 or above: No action needed, pain <4.  Pt. Asleep this morning,but did laundry yesterday and is doing fine per wife.

## 2015-08-02 ENCOUNTER — Encounter: Payer: Self-pay | Admitting: Internal Medicine

## 2015-08-02 DIAGNOSIS — Z86010 Personal history of colonic polyps: Secondary | ICD-10-CM

## 2015-08-02 NOTE — Progress Notes (Signed)
Quick Note:  2 adenomas - recall 2022 ______

## 2015-11-24 ENCOUNTER — Encounter: Payer: Self-pay | Admitting: Endocrinology

## 2015-11-24 ENCOUNTER — Ambulatory Visit (INDEPENDENT_AMBULATORY_CARE_PROVIDER_SITE_OTHER): Payer: Medicare Other | Admitting: Endocrinology

## 2015-11-24 VITALS — BP 130/80 | HR 88 | Temp 98.7°F | Wt 260.0 lb

## 2015-11-24 DIAGNOSIS — E119 Type 2 diabetes mellitus without complications: Secondary | ICD-10-CM

## 2015-11-24 LAB — POCT GLYCOSYLATED HEMOGLOBIN (HGB A1C): Hemoglobin A1C: 5.7

## 2015-11-24 NOTE — Patient Instructions (Addendum)
Please continue the same medications.  good diet and exercise significantly improve the control of your diabetes.  please let me know if you wish to be referred to a dietician.  high blood sugar is very risky to your health.  you should see an eye doctor and dentist every year.  It is very important to get all recommended vaccinations.  Please consider these measures for your health:  minimize alcohol.  Do not use tobacco products.  Have a colonoscopy at least every 10 years from age 55.  Keep firearms safely stored.  Always use seat belts.  have working smoke alarms in your home.  See an eye doctor and dentist regularly.  Never drive under the influence of alcohol or drugs (including prescription drugs).   please let me know what your wishes would be, if artificial life support measures should become necessary.  It is critically important to prevent falling down (keep floor areas well-lit, dry, and free of loose objects.  If you have a cane, walker, or wheelchair, you should use it, even for short trips around the house.  Wear flat-soled shoes.  Also, try not to rush).   Some specialists say that taking a high amount of folic acid (4-5 mg per day) helps the neuropathy.   Please come back for a regular physical appointment in 6 months (must be after 05/25/16).

## 2015-11-24 NOTE — Progress Notes (Signed)
Subjective:    Patient ID: Mitchell Alvarado, male    DOB: 03/03/1951, 65 y.o.   MRN: [id number]  HPI Pt returns for f/u of diabetes mellitus: DM type: 2 Dx'ed: 1996.   Complications: polyneuropathy, renal insufficiency, and non-proliferative retinopathy.  Therapy: 3 oral meds DKA: never.   Severe hypoglycemia: never Pancreatitis: never Other: he has never been on insulin.  He can't take metformin, due to renal insufficiency.   Interval history: Pt says cbg's are well-controlled.  pt states he feels well in general.   Past Medical History:  Diagnosis Date  . ANEMIA, IRON DEFICIENCY 12/23/2009  . DIABETES MELLITUS, TYPE II 12/30/2006  . ECZEMA 05/15/2009  . GERD 12/30/2006  . HYPERLIPIDEMIA 12/30/2006  . HYPERTENSION 12/30/2006  . HYPERTHYROIDISM 06/03/2008  . Pernicious anemia 12/30/2006  . Personal history of colonic polyps - adenoma 07/22/2004    Past Surgical History:  Procedure Laterality Date  . COLONOSCOPY  multiple  . ESOPHAGOGASTRODUODENOSCOPY  multiple  . TRANSURETHRAL RESECTION OF PROSTATE  2000    Social History   Social History  . Marital status: Married    Spouse name: N/A  . Number of children: N/A  . Years of education: N/A   Occupational History  . Retired from school system    Social History Main Topics  . Smoking status: Former Research scientist (life sciences)  . Smokeless tobacco: Never Used  . Alcohol use 4.2 oz/week    7 Glasses of wine per week     Comment: 5-7 glasses wine weekly  . Drug use: No  . Sexual activity: Not on file   Other Topics Concern  . Not on file   Social History Narrative  . No narrative on file    Current Outpatient Prescriptions on File Prior to Visit  Medication Sig Dispense Refill  . aspirin 81 MG tablet Take 1 tablet (81 mg total) by mouth daily. 90 tablet 3  . bromocriptine (PARLODEL) 2.5 MG tablet Take 1 tablet (2.5 mg total) by mouth at bedtime. 90 tablet 3  . calcitRIOL (ROCALTROL) 0.25 MCG capsule Take 1 capsule (0.25 mcg total) by  mouth daily. Take Monday Wednesday and Friday. 40 capsule 3  . carvedilol (COREG) 3.125 MG tablet TAKE 1 TABLET TWICE A DAY 180 tablet 3  . cyanocobalamin (,VITAMIN B-12,) 1000 MCG/ML injection INJECT 1ML INTRAMUSCULARY ONCE A MONTH 1 mL 5  . ezetimibe-simvastatin (VYTORIN) 10-80 MG tablet TAKE 1 TABLET BY MOUTH EVERY DAY 90 tablet 3  . hydrochlorothiazide (HYDRODIURIL) 25 MG tablet Take 1 tablet (25 mg total) by mouth daily. 90 tablet 3  . latanoprost (XALATAN) 0.005 % ophthalmic solution Place 1 drop into both eyes at bedtime.    Marland Kitchen omeprazole (PRILOSEC OTC) 20 MG tablet Take 1 tablet (20 mg total) by mouth daily. 90 tablet 3  . pioglitazone (ACTOS) 45 MG tablet TAKE 1 TABLET BY MOUTH EVERY DAY 90 tablet 3  . saxagliptin HCl (ONGLYZA) 5 MG TABS tablet TAKE 1 TABLET BY MOUTH EVERY MORNING 90 tablet 3  . sildenafil (REVATIO) 20 MG tablet 2-5 pills as needed for ED symptoms 50 tablet 10  . SYRINGE-NEEDLE, DISP, 3 ML (BD INTEGRA SYRINGE) 25G X 1" 3 ML MISC USE 1 SYRINGE ONCE A MONTH AS DIRECTED 3 each 3   No current facility-administered medications on file prior to visit.     No Known Allergies  Family History  Problem Relation Age of Onset  . Heart attack Father     MI  . Depression Son   .  COPD Son   . Cancer Neg Hx   . Colon cancer Neg Hx     BP 130/80 (BP Location: Right Arm, Patient Position: Sitting, Cuff Size: Large)   Pulse 88   Temp 98.7 F (37.1 C) (Oral)   Wt 260 lb (117.9 kg)   BMI 32.50 kg/m    Review of Systems No weight change    Objective:   Physical Exam VITAL SIGNS:  See vs page GENERAL: no distress Pulses: dorsalis pedis intact bilat.   MSK: no deformity of the feet CV: no leg edema Skin:  no ulcer on the feet.  normal color and temp on the feet. Neuro: sensation is intact to touch on the feet, but decreased from normal    Lab Results  Component Value Date   CREATININE 1.29 05/26/2015   BUN 16 05/26/2015   NA 139 05/26/2015   K 4.4 05/26/2015     CL 101 05/26/2015   CO2 29 05/26/2015   Lab Results  Component Value Date   HGBA1C 5.7 11/24/2015      Assessment & Plan:  Type 2 DM: well-controlled Polyneuropathy, worse  Subjective:   Patient here for Medicare annual wellness visit and management of other chronic and acute problems.     Risk factors: advanced age    31 of Physicians Providing Medical Care to Patient:  See "snapshot"   Activities of Daily Living: In your present state of health, do you have any difficulty performing the following activities?:  Preparing food and eating?: No  Bathing yourself: No  Getting dressed: No  Using the toilet:No  Moving around from place to place: No  In the past year have you fallen or had a near fall?:No    Home Safety: Has smoke detector and wears seat belts. Firearms are safely stored.   Diet and Exercise  Current exercise habits: pt says just "OK." Dietary issues discussed: pt reports a somewhat healthy diet   Depression Screen  Q1: Over the past two weeks, have you felt down, depressed or hopeless?no  Q2: Over the past two weeks, have you felt little interest or pleasure in doing things? no   The following portions of the patient's history were reviewed and updated as appropriate: allergies, current medications, past family history, past medical history, past social history, past surgical history and problem list.   Review of Systems  Denies hearing loss, and visual loss Objective:   Vision:  Advertising account executive, so he declines VA today Hearing: grossly normal Body mass index:  See vs page Msk: pt easily and quickly performs "get-up-and-go" from a sitting position Cognitive Impairment Assessment: cognition, memory and judgment appear normal.  remembers 3/3 at 5 minutes.  excellent recall.  can easily read and write a sentence.  alert and oriented x 3.     Assessment:   Medicare wellness utd on preventive parameters    Plan:   During the course of the  visit the patient was educated and counseled about appropriate screening and preventive services including:        Fall prevention   Diabetes screening  Nutrition counseling   Vaccines / LABS Zostavax / Pneumococcal Vaccine  today   Patient Instructions (the written plan) was given to the patient.

## 2015-11-24 NOTE — Progress Notes (Signed)
we discussed code status.  pt requests full code, but would not want to be started or maintained on artificial life-support measures if there was not a reasonable chance of recovery 

## 2015-11-24 NOTE — Progress Notes (Signed)
Pre visit review using our clinic review tool, if applicable. No additional management support is needed unless otherwise documented below in the visit note. 

## 2015-12-21 ENCOUNTER — Other Ambulatory Visit: Payer: Self-pay | Admitting: Endocrinology

## 2016-01-19 ENCOUNTER — Encounter: Payer: Self-pay | Admitting: Endocrinology

## 2016-01-19 LAB — HM DIABETES EYE EXAM

## 2016-03-18 ENCOUNTER — Other Ambulatory Visit: Payer: Self-pay | Admitting: Endocrinology

## 2016-03-31 IMAGING — NM NM PARATHYROID W/ SPECT
3 series · 18 of 18 positions shown · non-contrast
Comparison: None.

CLINICAL DATA: Secondary hyperparathyroidism. Evaluate for primary
hyperparathyroidism.

EXAM:
NM PARATHYROID SCINTIGRAPHY AND SPECT IMAGING
TECHNIQUE: Following intravenous administration of radiopharmaceutical, early
and 2-hour delayed planar images were obtained in the anterior
projection. Delayed triplanar SPECT images were also obtained at 2
hours.
RADIOPHARMACEUTICALS:  Twenty-five mOiYc-FFm Sestamibi IV

[Series 1: spect - (id)_(id)_cor · 8.3mm · 8.28mm/px · 6 of 64 frames shown]
[frame 6/64]
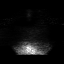
[frame 16/64]
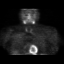
[frame 27/64]
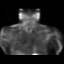
[frame 38/64]
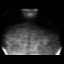
[frame 48/64]
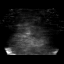
[frame 59/64]
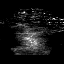

[Series 1: spect - (id)_(id)_sa · 8.3mm · 8.28mm/px · 6 of 64 frames shown]
[frame 6/64]
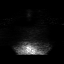
[frame 16/64]
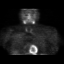
[frame 27/64]
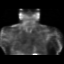
[frame 38/64]
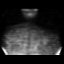
[frame 48/64]
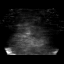
[frame 59/64]
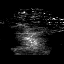

[Series 4: wbr_bone spect parathyroid · 8.3mm · 8.28mm/px · 6 of 64 frames shown]
[frame 6/64]
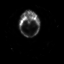
[frame 16/64]
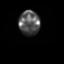
[frame 27/64]
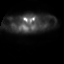
[frame 38/64]
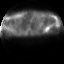
[frame 48/64]
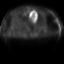
[frame 59/64]
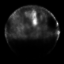

[18 of 18 positions shown; findings below may reference images not displayed]

FINDINGS: Delayed planar imaging demonstrates retained radiotracer activity
within the thyroid gland. No focal activity present. SPECT imaging
of the neck and chest demonstrates thyroid gland activity without
increased or focal activity above background thyroid activity. No
abnormal activity in the chest.
IMPRESSION: No parathyroid adenoma localized on planar or SPECT imaging within
neck or upper chest.

## 2016-05-17 ENCOUNTER — Other Ambulatory Visit: Payer: Self-pay | Admitting: Endocrinology

## 2016-05-17 DIAGNOSIS — E1122 Type 2 diabetes mellitus with diabetic chronic kidney disease: Secondary | ICD-10-CM

## 2016-05-17 DIAGNOSIS — K921 Melena: Secondary | ICD-10-CM

## 2016-05-17 DIAGNOSIS — Z0000 Encounter for general adult medical examination without abnormal findings: Secondary | ICD-10-CM

## 2016-05-17 DIAGNOSIS — E0580 Other thyrotoxicosis without thyrotoxic crisis or storm: Secondary | ICD-10-CM

## 2016-05-17 DIAGNOSIS — D509 Iron deficiency anemia, unspecified: Secondary | ICD-10-CM

## 2016-05-17 DIAGNOSIS — Z125 Encounter for screening for malignant neoplasm of prostate: Secondary | ICD-10-CM

## 2016-05-17 DIAGNOSIS — Z23 Encounter for immunization: Secondary | ICD-10-CM

## 2016-05-17 DIAGNOSIS — Z86010 Personal history of colonic polyps: Secondary | ICD-10-CM

## 2016-05-17 DIAGNOSIS — N183 Chronic kidney disease, stage 3 (moderate): Secondary | ICD-10-CM

## 2016-05-27 ENCOUNTER — Encounter: Payer: Self-pay | Admitting: Endocrinology

## 2016-05-27 ENCOUNTER — Ambulatory Visit (INDEPENDENT_AMBULATORY_CARE_PROVIDER_SITE_OTHER): Payer: Medicare Other | Admitting: Endocrinology

## 2016-05-27 VITALS — BP 128/64 | HR 102 | Ht 75.0 in | Wt 254.0 lb

## 2016-05-27 DIAGNOSIS — N183 Chronic kidney disease, stage 3 unspecified: Secondary | ICD-10-CM

## 2016-05-27 DIAGNOSIS — Z0000 Encounter for general adult medical examination without abnormal findings: Secondary | ICD-10-CM | POA: Diagnosis not present

## 2016-05-27 DIAGNOSIS — D509 Iron deficiency anemia, unspecified: Secondary | ICD-10-CM | POA: Diagnosis not present

## 2016-05-27 DIAGNOSIS — Z125 Encounter for screening for malignant neoplasm of prostate: Secondary | ICD-10-CM | POA: Diagnosis not present

## 2016-05-27 DIAGNOSIS — E1122 Type 2 diabetes mellitus with diabetic chronic kidney disease: Secondary | ICD-10-CM | POA: Diagnosis not present

## 2016-05-27 DIAGNOSIS — D510 Vitamin B12 deficiency anemia due to intrinsic factor deficiency: Secondary | ICD-10-CM

## 2016-05-27 DIAGNOSIS — N2581 Secondary hyperparathyroidism of renal origin: Secondary | ICD-10-CM | POA: Insufficient documentation

## 2016-05-27 LAB — CBC WITH DIFFERENTIAL/PLATELET
BASOS PCT: 1.4 % (ref 0.0–3.0)
Basophils Absolute: 0 10*3/uL (ref 0.0–0.1)
EOS ABS: 0 10*3/uL (ref 0.0–0.7)
Eosinophils Relative: 1.8 % (ref 0.0–5.0)
HEMATOCRIT: 34.8 % — AB (ref 39.0–52.0)
HEMOGLOBIN: 11.8 g/dL — AB (ref 13.0–17.0)
LYMPHS PCT: 41 % (ref 12.0–46.0)
Lymphs Abs: 1.1 10*3/uL (ref 0.7–4.0)
MCHC: 33.8 g/dL (ref 30.0–36.0)
MCV: 102.6 fl — ABNORMAL HIGH (ref 78.0–100.0)
Monocytes Absolute: 0.3 10*3/uL (ref 0.1–1.0)
Monocytes Relative: 11.6 % (ref 3.0–12.0)
Neutro Abs: 1.2 10*3/uL — ABNORMAL LOW (ref 1.4–7.7)
Neutrophils Relative %: 44.2 % (ref 43.0–77.0)
Platelets: 170 10*3/uL (ref 150.0–400.0)
RBC: 3.39 Mil/uL — ABNORMAL LOW (ref 4.22–5.81)
RDW: 14.3 % (ref 11.5–15.5)

## 2016-05-27 LAB — LIPID PANEL
CHOL/HDL RATIO: 2
Cholesterol: 269 mg/dL — ABNORMAL HIGH (ref 0–200)
HDL: 151.3 mg/dL (ref >39.00)
LDL Cholesterol: 80 mg/dL (ref 0–99)
NONHDL: 117.8
Triglycerides: 189 mg/dL — ABNORMAL HIGH (ref 0.0–149.0)
VLDL: 37.8 mg/dL (ref 0.0–40.0)

## 2016-05-27 LAB — URINALYSIS, ROUTINE W REFLEX MICROSCOPIC
BILIRUBIN URINE: NEGATIVE
HGB URINE DIPSTICK: NEGATIVE
Ketones, ur: NEGATIVE
LEUKOCYTES UA: NEGATIVE
Nitrite: NEGATIVE
PH: 6 (ref 5.0–8.0)
RBC / HPF: NONE SEEN (ref 0–?)
Specific Gravity, Urine: 1.02 (ref 1.000–1.030)
TOTAL PROTEIN, URINE-UPE24: NEGATIVE
URINE GLUCOSE: NEGATIVE
UROBILINOGEN UA: 0.2 (ref 0.0–1.0)
WBC, UA: NONE SEEN (ref 0–?)

## 2016-05-27 LAB — BASIC METABOLIC PANEL
BUN: 23 mg/dL (ref 6–23)
CALCIUM: 9.2 mg/dL (ref 8.4–10.5)
CO2: 28 meq/L (ref 19–32)
CREATININE: 1.33 mg/dL (ref 0.40–1.50)
Chloride: 99 mEq/L (ref 96–112)
GFR: 69.18 mL/min (ref >60.00)
GLUCOSE: 115 mg/dL — AB (ref 70–99)
Potassium: 4 mEq/L (ref 3.5–5.1)
SODIUM: 137 meq/L (ref 135–145)

## 2016-05-27 LAB — MICROALBUMIN / CREATININE URINE RATIO
Creatinine,U: 221.2 mg/dL
MICROALB UR: 4.9 mg/dL — AB (ref 0.0–1.9)
Microalb Creat Ratio: 2.2 mg/g (ref 0.0–30.0)

## 2016-05-27 LAB — HEPATIC FUNCTION PANEL
ALT: 21 U/L (ref 0–53)
AST: 28 U/L (ref 0–37)
Albumin: 4 g/dL (ref 3.5–5.2)
Alkaline Phosphatase: 42 U/L (ref 39–117)
BILIRUBIN DIRECT: 0.2 mg/dL (ref 0.0–0.3)
BILIRUBIN TOTAL: 0.9 mg/dL (ref 0.2–1.2)
Total Protein: 6.6 g/dL (ref 6.0–8.3)

## 2016-05-27 LAB — IBC PANEL
IRON: 148 ug/dL (ref 42–165)
Saturation Ratios: 52.9 % — ABNORMAL HIGH (ref 20.0–50.0)
Transferrin: 200 mg/dL — ABNORMAL LOW (ref 212.0–360.0)

## 2016-05-27 LAB — VITAMIN D 25 HYDROXY (VIT D DEFICIENCY, FRACTURES): VITD: 13.62 ng/mL — ABNORMAL LOW (ref 30.00–100.00)

## 2016-05-27 LAB — POCT GLYCOSYLATED HEMOGLOBIN (HGB A1C): Hemoglobin A1C: 5.6

## 2016-05-27 LAB — TSH: TSH: 0.57 u[IU]/mL (ref 0.35–4.50)

## 2016-05-27 LAB — PSA: PSA: 0.36 ng/mL (ref 0.10–4.00)

## 2016-05-27 MED ORDER — GLUCOSE BLOOD VI STRP: 1.0000 | ORAL_STRIP | Freq: Every day | 3 refills | Status: DC

## 2016-05-27 MED ORDER — VITAMIN D (ERGOCALCIFEROL) 1.25 MG (50000 UNIT) PO CAPS: 50000.0000 [IU] | ORAL_CAPSULE | ORAL | 0 refills | Status: AC

## 2016-05-27 NOTE — Progress Notes (Signed)
Subjective:    Patient ID: Mitchell Alvarado, male    DOB: 11/04/1950, 66 y.o.   MRN: [id number]  HPI Pt is here for regular wellness examination, and is feeling pretty well in general, and says chronic med probs are stable, except as noted below. Past Medical History:  Diagnosis Date  . ANEMIA, IRON DEFICIENCY 12/23/2009  . DIABETES MELLITUS, TYPE II 12/30/2006  . ECZEMA 05/15/2009  . GERD 12/30/2006  . HYPERLIPIDEMIA 12/30/2006  . HYPERTENSION 12/30/2006  . HYPERTHYROIDISM 06/03/2008  . Pernicious anemia 12/30/2006  . Personal history of colonic polyps - adenoma 07/22/2004    Past Surgical History:  Procedure Laterality Date  . COLONOSCOPY  multiple  . ESOPHAGOGASTRODUODENOSCOPY  multiple  . TRANSURETHRAL RESECTION OF PROSTATE  2000    Social History   Social History  . Marital status: Married    Spouse name: N/A  . Number of children: N/A  . Years of education: N/A   Occupational History  . Retired from school system    Social History Main Topics  . Smoking status: Former Research scientist (life sciences)  . Smokeless tobacco: Never Used  . Alcohol use 4.2 oz/week    7 Glasses of wine per week     Comment: 5-7 glasses wine weekly  . Drug use: No  . Sexual activity: Not on file   Other Topics Concern  . Not on file   Social History Narrative  . No narrative on file    Current Outpatient Prescriptions on File Prior to Visit  Medication Sig Dispense Refill  . aspirin 81 MG tablet Take 1 tablet (81 mg total) by mouth daily. 90 tablet 3  . bromocriptine (PARLODEL) 2.5 MG tablet TAKE 1 TABLET (2.5 MG TOTAL) BY MOUTH AT BEDTIME. 90 tablet 3  . calcitRIOL (ROCALTROL) 0.25 MCG capsule TAKE 1 CAPSULE (0.25 MCG TOTAL) BY MOUTH DAILY. TAKE MONDAY WEDNESDAY AND FRIDAY. 40 capsule 3  . carvedilol (COREG) 3.125 MG tablet TAKE 1 TABLET TWICE A DAY 180 tablet 3  . cyanocobalamin (,VITAMIN B-12,) 1000 MCG/ML injection INJECT 1ML INTRAMUSCULARY ONCE A MONTH 1 mL 5  . ezetimibe-simvastatin (VYTORIN) 10-80 MG  tablet TAKE 1 TABLET BY MOUTH EVERY DAY 90 tablet 3  . hydrochlorothiazide (HYDRODIURIL) 25 MG tablet TAKE 1 TABLET (25 MG TOTAL) BY MOUTH DAILY. 90 tablet 3  . latanoprost (XALATAN) 0.005 % ophthalmic solution Place 1 drop into both eyes at bedtime.    Marland Kitchen omeprazole (PRILOSEC) 20 MG capsule TAKE 1 TABLET (20 MG TOTAL) BY MOUTH DAILY. 90 capsule 3  . ONGLYZA 5 MG TABS tablet TAKE 1 TABLET BY MOUTH EVERY MORNING 90 tablet 3  . pioglitazone (ACTOS) 45 MG tablet TAKE 1 TABLET BY MOUTH EVERY DAY 90 tablet 3  . sildenafil (REVATIO) 20 MG tablet 2-5 pills as needed for ED symptoms 50 tablet 10  . SYRINGE-NEEDLE, DISP, 3 ML (BD INTEGRA SYRINGE) 25G X 1" 3 ML MISC USE 1 SYRINGE ONCE A MONTH AS DIRECTED 3 each 3   No current facility-administered medications on file prior to visit.     No Known Allergies  Family History  Problem Relation Age of Onset  . Heart attack Father     MI  . Depression Son   . COPD Son   . Cancer Neg Hx   . Colon cancer Neg Hx     BP 128/64   Pulse (!) 102   Ht 6\' 3"  (1.905 m)   Wt 254 lb (115.2 kg)   SpO2 94%  BMI 31.75 kg/m   Review of Systems Constitutional: Negative for fever and weight change.  HENT: Negative for hearing loss.   Eyes: Negative for visual disturbance.  Respiratory: Negative for shortness of breath.   Cardiovascular: Negative for chest pain.  Gastrointestinal: Negative for abdominal pain.  Endocrine: Negative for cold intolerance.  Musculoskeletal: Negative for back pain.  Skin: Negative for rash.  Allergic/Immunologic: Positive for seasonal environmental allergies.  Neurological: Positive for numbness. Negative for syncope.  Hematological: Bruises/bleeds easily.  Psychiatric/Behavioral: Negative for dysphoric mood.     Objective:   Physical Exam VS: see vs page GEN: no distress HEAD: head: no deformity eyes: no periorbital swelling, no proptosis external nose and ears are normal mouth: no lesion seen NECK: supple, thyroid is  not enlarged CHEST WALL: no deformity LUNGS: clear to auscultation BREASTS:  No gynecomastia CV: reg rate and rhythm, no murmur ABD: abdomen is soft, nontender.  no hepatosplenomegaly.  not distended.  no hernia.  RECTAL: normal external and internal exam.  heme neg. PROSTATE:  Normal size.  No nodule MUSCULOSKELETAL: muscle bulk and strength are grossly normal.  no obvious joint swelling.  gait is normal and steady EXTEMITIES: no deformity.  no ulcer on the feet.  feet are of normal color and temp.  no edema PULSES: dorsalis pedis intact bilat.  no carotid bruit NEURO:  cn 2-12 grossly intact.   readily moves all 4's.  sensation is intact to touch on the feet SKIN:  Normal texture and temperature.  No rash or suspicious lesion is visible.   NODES:  None palpable at the neck PSYCH: alert, well-oriented.  Does not appear anxious nor depressed.   I personally reviewed electrocardiogram tracing (today): Indication: DM.  Impression: NSR.  No MI.  No hypertrophy.  Compared to 2017: no change.       Assessment & Plan:  Wellness visit today, with problems stable, except as noted.   SEPARATE EVALUATION FOLLOWS--EACH PROBLEM HERE IS NEW, NOT RESPONDING TO TREATMENT, OR POSES SIGNIFICANT RISK TO THE PATIENT'S HEALTH: HISTORY OF THE PRESENT ILLNESS: Pt has h/o vit-D deficiency.  He does not take a supplement Anemia is again noted.   PAST MEDICAL HISTORY Past Medical History:  Diagnosis Date  . ANEMIA, IRON DEFICIENCY 12/23/2009  . DIABETES MELLITUS, TYPE II 12/30/2006  . ECZEMA 05/15/2009  . GERD 12/30/2006  . HYPERLIPIDEMIA 12/30/2006  . HYPERTENSION 12/30/2006  . HYPERTHYROIDISM 06/03/2008  . Pernicious anemia 12/30/2006  . Personal history of colonic polyps - adenoma 07/22/2004    Past Surgical History:  Procedure Laterality Date  . COLONOSCOPY  multiple  . ESOPHAGOGASTRODUODENOSCOPY  multiple  . TRANSURETHRAL RESECTION OF PROSTATE  2000    Social History   Social History  .  Marital status: Married    Spouse name: N/A  . Number of children: N/A  . Years of education: N/A   Occupational History  . Retired from school system    Social History Main Topics  . Smoking status: Former Research scientist (life sciences)  . Smokeless tobacco: Never Used  . Alcohol use 4.2 oz/week    7 Glasses of wine per week     Comment: 5-7 glasses wine weekly  . Drug use: No  . Sexual activity: Not on file   Other Topics Concern  . Not on file   Social History Narrative  . No narrative on file    Current Outpatient Prescriptions on File Prior to Visit  Medication Sig Dispense Refill  . aspirin 81 MG tablet Take  1 tablet (81 mg total) by mouth daily. 90 tablet 3  . bromocriptine (PARLODEL) 2.5 MG tablet TAKE 1 TABLET (2.5 MG TOTAL) BY MOUTH AT BEDTIME. 90 tablet 3  . calcitRIOL (ROCALTROL) 0.25 MCG capsule TAKE 1 CAPSULE (0.25 MCG TOTAL) BY MOUTH DAILY. TAKE MONDAY WEDNESDAY AND FRIDAY. 40 capsule 3  . carvedilol (COREG) 3.125 MG tablet TAKE 1 TABLET TWICE A DAY 180 tablet 3  . cyanocobalamin (,VITAMIN B-12,) 1000 MCG/ML injection INJECT 1ML INTRAMUSCULARY ONCE A MONTH 1 mL 5  . ezetimibe-simvastatin (VYTORIN) 10-80 MG tablet TAKE 1 TABLET BY MOUTH EVERY DAY 90 tablet 3  . hydrochlorothiazide (HYDRODIURIL) 25 MG tablet TAKE 1 TABLET (25 MG TOTAL) BY MOUTH DAILY. 90 tablet 3  . latanoprost (XALATAN) 0.005 % ophthalmic solution Place 1 drop into both eyes at bedtime.    Marland Kitchen omeprazole (PRILOSEC) 20 MG capsule TAKE 1 TABLET (20 MG TOTAL) BY MOUTH DAILY. 90 capsule 3  . ONGLYZA 5 MG TABS tablet TAKE 1 TABLET BY MOUTH EVERY MORNING 90 tablet 3  . pioglitazone (ACTOS) 45 MG tablet TAKE 1 TABLET BY MOUTH EVERY DAY 90 tablet 3  . sildenafil (REVATIO) 20 MG tablet 2-5 pills as needed for ED symptoms 50 tablet 10  . SYRINGE-NEEDLE, DISP, 3 ML (BD INTEGRA SYRINGE) 25G X 1" 3 ML MISC USE 1 SYRINGE ONCE A MONTH AS DIRECTED 3 each 3   No current facility-administered medications on file prior to visit.     No  Known Allergies  Family History  Problem Relation Age of Onset  . Heart attack Father     MI  . Depression Son   . COPD Son   . Cancer Neg Hx   . Colon cancer Neg Hx     BP 128/64   Pulse (!) 102   Ht 6\' 3"  (1.905 m)   Wt 254 lb (115.2 kg)   SpO2 94%   BMI 31.75 kg/m   REVIEW OF SYSTEMS: Denies hematuria PHYSICAL EXAMINATION: VITAL SIGNS:  See vs page GENERAL: no distress Skin: no ecchymosis is seen LAB/XRAY RESULTS: 25-OH vit-D=17 Lab Results  Component Value Date   WBC 2.7 Repeated and verified X2. (L) 05/27/2016   HGB 11.8 (L) 05/27/2016   HCT 34.8 (L) 05/27/2016   MCV 102.6 (H) 05/27/2016   PLT 170.0 05/27/2016   Lab Results  Component Value Date   CREATININE 1.33 05/27/2016   BUN 23 05/27/2016   NA 137 05/27/2016   K 4.0 05/27/2016   CL 99 05/27/2016   CO2 28 05/27/2016   IMPRESSION: Anemia, persistent Vit-D deficiency, persistent Low WBC: worse PLAN:  I have sent a prescription to your pharmacy Recheck labs 1 month.

## 2016-05-27 NOTE — Patient Instructions (Signed)
Please consider these measures for your health:  minimize alcohol.  Do not use tobacco products.  Have a colonoscopy at least every 10 years from age 66.   Keep firearms safely stored.  Always use seat belts.  have working smoke alarms in your home.  See an eye doctor and dentist regularly.  Never drive under the influence of alcohol or drugs (including prescription drugs).  blood tests are requested for you today.  We'll let you know about the results. Please come back for a follow-up appointment in 6 months.    

## 2016-05-28 LAB — HEPATITIS C ANTIBODY: HCV AB: NEGATIVE

## 2016-05-30 LAB — PTH, INTACT AND CALCIUM
Calcium: 9.4 mg/dL (ref 8.6–10.3)
PTH: 164 pg/mL — ABNORMAL HIGH (ref 14–64)

## 2016-06-22 ENCOUNTER — Other Ambulatory Visit: Payer: Self-pay | Admitting: Endocrinology

## 2016-11-29 ENCOUNTER — Encounter: Payer: Self-pay | Admitting: Endocrinology

## 2016-11-29 ENCOUNTER — Ambulatory Visit (INDEPENDENT_AMBULATORY_CARE_PROVIDER_SITE_OTHER): Payer: Medicare Other | Admitting: Endocrinology

## 2016-11-29 VITALS — BP 130/82 | HR 90 | Wt 263.0 lb

## 2016-11-29 DIAGNOSIS — Z0000 Encounter for general adult medical examination without abnormal findings: Secondary | ICD-10-CM | POA: Diagnosis not present

## 2016-11-29 DIAGNOSIS — N183 Chronic kidney disease, stage 3 unspecified: Secondary | ICD-10-CM

## 2016-11-29 DIAGNOSIS — E1122 Type 2 diabetes mellitus with diabetic chronic kidney disease: Secondary | ICD-10-CM

## 2016-11-29 DIAGNOSIS — Z23 Encounter for immunization: Secondary | ICD-10-CM

## 2016-11-29 LAB — POCT GLYCOSYLATED HEMOGLOBIN (HGB A1C): HEMOGLOBIN A1C: 5.8

## 2016-11-29 NOTE — Patient Instructions (Addendum)
Please let me know if you want neck x-rays and/or to see Dr Tamala Julian at the old office.  Please continue the same medications for diabetes.  januvia and tradjenta at similar to onglyza.  Call if you want to change to 1 of these, and we'll make the change. good diet and exercise significantly improve the control of your diabetes.  please let me know if you wish to be referred to a dietician.  high blood sugar is very risky to your health.  you should see an eye doctor and dentist every year.  It is very important to get all recommended vaccinations.  It is critically important to prevent falling down (keep floor areas well-lit, dry, and free of loose objects.  If you have a cane, walker, or wheelchair, you should use it, even for short trips around the house.  Wear flat-soled shoes.  Also, try not to rush).   Please come back for a regular physical appointment in 6 months (must be after 05/27/17).

## 2016-11-29 NOTE — Progress Notes (Signed)
Subjective:    Patient ID: Mitchell Alvarado, male    DOB: 14-Apr-1950, 66 y.o.   MRN: [id number]  HPI  Pt returns for f/u of diabetes mellitus: DM type: 2 Dx'ed: 1996.   Complications: polyneuropathy, renal insufficiency, and non-proliferative retinopathy.  Therapy: 3 oral meds DKA: never.   Severe hypoglycemia: never Pancreatitis: never Other: he has never been on insulin.  He can't take metformin, due to renal insufficiency.   Interval history: Pt says cbg's are well-controlled.  He says onglyza is too expensive.    Past Medical History:  Diagnosis Date  . ANEMIA, IRON DEFICIENCY 12/23/2009  . DIABETES MELLITUS, TYPE II 12/30/2006  . ECZEMA 05/15/2009  . GERD 12/30/2006  . HYPERLIPIDEMIA 12/30/2006  . HYPERTENSION 12/30/2006  . HYPERTHYROIDISM 06/03/2008  . Pernicious anemia 12/30/2006  . Personal history of colonic polyps - adenoma 07/22/2004    Past Surgical History:  Procedure Laterality Date  . COLONOSCOPY  multiple  . ESOPHAGOGASTRODUODENOSCOPY  multiple  . TRANSURETHRAL RESECTION OF PROSTATE  2000    Social History   Social History  . Marital status: Married    Spouse name: N/A  . Number of children: N/A  . Years of education: N/A   Occupational History  . Retired from school system    Social History Main Topics  . Smoking status: Former Research scientist (life sciences)  . Smokeless tobacco: Never Used  . Alcohol use 4.2 oz/week    7 Glasses of wine per week     Comment: 5-7 glasses wine weekly  . Drug use: No  . Sexual activity: Not on file   Other Topics Concern  . Not on file   Social History Narrative  . No narrative on file    Current Outpatient Prescriptions on File Prior to Visit  Medication Sig Dispense Refill  . aspirin 81 MG tablet Take 1 tablet (81 mg total) by mouth daily. 90 tablet 3  . bromocriptine (PARLODEL) 2.5 MG tablet TAKE 1 TABLET (2.5 MG TOTAL) BY MOUTH AT BEDTIME. 90 tablet 3  . calcitRIOL (ROCALTROL) 0.25 MCG capsule TAKE 1 CAPSULE (0.25 MCG TOTAL) BY  MOUTH DAILY. TAKE MONDAY WEDNESDAY AND FRIDAY. 40 capsule 3  . carvedilol (COREG) 3.125 MG tablet TAKE 1 TABLET TWICE A DAY 180 tablet 3  . cyanocobalamin (,VITAMIN B-12,) 1000 MCG/ML injection INJECT 1ML INTRAMUSCULARY ONCE A MONTH 1 mL 5  . ezetimibe-simvastatin (VYTORIN) 10-80 MG tablet TAKE 1 TABLET BY MOUTH EVERY DAY 90 tablet 3  . glucose blood (ONETOUCH VERIO) test strip 1 each by Other route daily. And lancets 1/day 100 each 3  . hydrochlorothiazide (HYDRODIURIL) 25 MG tablet TAKE 1 TABLET (25 MG TOTAL) BY MOUTH DAILY. 90 tablet 3  . latanoprost (XALATAN) 0.005 % ophthalmic solution Place 1 drop into both eyes at bedtime.    Marland Kitchen omeprazole (PRILOSEC) 20 MG capsule TAKE 1 TABLET (20 MG TOTAL) BY MOUTH DAILY. 90 capsule 3  . ONGLYZA 5 MG TABS tablet TAKE 1 TABLET BY MOUTH EVERY MORNING 90 tablet 3  . pioglitazone (ACTOS) 45 MG tablet TAKE 1 TABLET BY MOUTH EVERY DAY 90 tablet 3  . sildenafil (REVATIO) 20 MG tablet 2-5 pills as needed for ED symptoms 50 tablet 10  . SYRINGE-NEEDLE, DISP, 3 ML (BD INTEGRA SYRINGE) 25G X 1" 3 ML MISC USE 1 SYRINGE ONCE A MONTH AS DIRECTED 3 each 3   No current facility-administered medications on file prior to visit.     No Known Allergies  Family History  Problem  Relation Age of Onset  . Heart attack Father        MI  . Depression Son   . COPD Son   . Cancer Neg Hx   . Colon cancer Neg Hx     BP 130/82   Pulse 90   Wt 263 lb (119.3 kg)   SpO2 96%   BMI 32.87 kg/m    Review of Systems He has 2 months of neck pain.  No injury.  No numbness.      Objective:   Physical Exam VITAL SIGNS:  See vs page GENERAL: no distress Neck: full ROM, without pain Pulses: foot pulses are intact bilaterally.   MSK: no deformity of the feet or ankles.  CV: no edema of the legs or ankles Skin:  no ulcer on the feet or ankles.  normal color and temp on the feet and ankles Neuro: sensation is intact to touch on the feet and ankles.     Lab Results    Component Value Date   HGBA1C 5.8 11/29/2016      Assessment & Plan:  Type 2 DM, with polyneuropathy: well-controlled. Neck pain, new, prob due to OA Renal insuff: this limits rx options.   Please let me know if you want neck x-rays and/or to see Dr Tamala Julian at the old office.  Please continue the same medications for diabetes.  januvia and tradjenta at similar to onglyza.  Call if you want to change to 1 of these, and we'll make the change.    Subjective:   Patient here for Medicare annual wellness visit and management of other chronic and acute problems.     Risk factors: advanced age    53 of Physicians Providing Medical Care to Patient:  See "snapshot"   Activities of Daily Living: In your present state of health, do you have any difficulty performing the following activities (lives wife and dtr)?:  Preparing food and eating?: No  Bathing yourself: No  Getting dressed: No  Using the toilet: No  Moving around from place to place: No  In the past year have you fallen or had a near fall?: No    Home Safety: Has smoke detector and wears seat belts. Firearms are safely stored.    Diet and Exercise  Current exercise habits: pt says fair Dietary issues discussed: pt reports a healthy diet   Depression Screen  Q1: Over the past two weeks, have you felt down, depressed or hopeless? no  Q2: Over the past two weeks, have you felt little interest or pleasure in doing things? no   The following portions of the patient's history were reviewed and updated as appropriate: allergies, current medications, past family history, past medical history, past social history, past surgical history and problem list.   Review of Systems  Denies hearing loss, and visual loss Objective:   Vision:  Advertising account executive, so he declines VA today Hearing: grossly normal Body mass index:  See vs page Msk: pt easily and quickly performs "get-up-and-go" from a sitting position.  Cognitive  Impairment Assessment: cognition, memory and judgment appear normal.  remembers 3/3 at 5 minutes.  excellent recall.  can easily read and write a sentence.  alert and oriented x 3.     Assessment:   Medicare wellness utd on preventive parameters.     Plan:   During the course of the visit the patient was educated and counseled about appropriate screening and preventive services including:  Fall prevention is discussed  Diabetes rx is done Nutrition counseling is offered.   Vaccines are updated as needed  Patient Instructions (the written plan) was given to the patient.

## 2016-12-20 ENCOUNTER — Other Ambulatory Visit: Payer: Self-pay | Admitting: Endocrinology

## 2017-01-11 ENCOUNTER — Other Ambulatory Visit: Payer: Self-pay

## 2017-01-11 MED ORDER — CYANOCOBALAMIN 1000 MCG/ML IJ SOLN: INTRAMUSCULAR | 5 refills | Status: DC

## 2017-01-18 ENCOUNTER — Encounter: Payer: Self-pay | Admitting: Endocrinology

## 2017-01-18 LAB — HM DIABETES EYE EXAM

## 2017-01-26 ENCOUNTER — Other Ambulatory Visit: Payer: Self-pay

## 2017-01-26 MED ORDER — CYANOCOBALAMIN 1000 MCG/ML IJ SOLN: INTRAMUSCULAR | 5 refills | Status: DC

## 2017-05-29 ENCOUNTER — Encounter: Payer: Self-pay | Admitting: Endocrinology

## 2017-05-29 ENCOUNTER — Ambulatory Visit: Payer: Medicare Other | Admitting: Endocrinology

## 2017-05-29 VITALS — BP 134/80 | HR 99 | Temp 98.7°F | Ht 75.0 in | Wt 264.6 lb

## 2017-05-29 DIAGNOSIS — N183 Chronic kidney disease, stage 3 (moderate): Secondary | ICD-10-CM | POA: Diagnosis not present

## 2017-05-29 DIAGNOSIS — E0580 Other thyrotoxicosis without thyrotoxic crisis or storm: Secondary | ICD-10-CM

## 2017-05-29 DIAGNOSIS — K921 Melena: Secondary | ICD-10-CM

## 2017-05-29 DIAGNOSIS — I4891 Unspecified atrial fibrillation: Secondary | ICD-10-CM | POA: Diagnosis not present

## 2017-05-29 DIAGNOSIS — Z0000 Encounter for general adult medical examination without abnormal findings: Secondary | ICD-10-CM

## 2017-05-29 DIAGNOSIS — D509 Iron deficiency anemia, unspecified: Secondary | ICD-10-CM

## 2017-05-29 DIAGNOSIS — N289 Disorder of kidney and ureter, unspecified: Secondary | ICD-10-CM

## 2017-05-29 DIAGNOSIS — Z0001 Encounter for general adult medical examination with abnormal findings: Secondary | ICD-10-CM | POA: Diagnosis not present

## 2017-05-29 DIAGNOSIS — E1122 Type 2 diabetes mellitus with diabetic chronic kidney disease: Secondary | ICD-10-CM | POA: Diagnosis not present

## 2017-05-29 DIAGNOSIS — N2581 Secondary hyperparathyroidism of renal origin: Secondary | ICD-10-CM

## 2017-05-29 DIAGNOSIS — Z86010 Personal history of colonic polyps: Secondary | ICD-10-CM

## 2017-05-29 DIAGNOSIS — Z23 Encounter for immunization: Secondary | ICD-10-CM

## 2017-05-29 DIAGNOSIS — Z125 Encounter for screening for malignant neoplasm of prostate: Secondary | ICD-10-CM | POA: Diagnosis not present

## 2017-05-29 LAB — URINALYSIS, ROUTINE W REFLEX MICROSCOPIC
Bilirubin Urine: NEGATIVE
Hgb urine dipstick: NEGATIVE
Ketones, ur: NEGATIVE
Leukocytes, UA: NEGATIVE
Nitrite: NEGATIVE
RBC / HPF: NONE SEEN (ref 0–?)
Specific Gravity, Urine: 1.015 (ref 1.000–1.030)
Total Protein, Urine: NEGATIVE
Urine Glucose: NEGATIVE
Urobilinogen, UA: 1 (ref 0.0–1.0)
pH: 7 (ref 5.0–8.0)

## 2017-05-29 LAB — BASIC METABOLIC PANEL WITH GFR
BUN: 24 mg/dL — ABNORMAL HIGH (ref 6–23)
CO2: 27 meq/L (ref 19–32)
Calcium: 8.8 mg/dL (ref 8.4–10.5)
Chloride: 101 meq/L (ref 96–112)
Creatinine, Ser: 1.41 mg/dL (ref 0.40–1.50)
GFR: 64.47 mL/min (ref >60.00)
Glucose, Bld: 137 mg/dL — ABNORMAL HIGH (ref 70–99)
Potassium: 3.9 meq/L (ref 3.5–5.1)
Sodium: 139 meq/L (ref 135–145)

## 2017-05-29 LAB — CBC WITH DIFFERENTIAL/PLATELET
Basophils Absolute: 0 K/uL (ref 0.0–0.1)
Basophils Relative: 1.3 % (ref 0.0–3.0)
Eosinophils Absolute: 0 K/uL (ref 0.0–0.7)
Eosinophils Relative: 0.9 % (ref 0.0–5.0)
HCT: 32.9 % — ABNORMAL LOW (ref 39.0–52.0)
Hemoglobin: 11.3 g/dL — ABNORMAL LOW (ref 13.0–17.0)
Lymphocytes Relative: 34.2 % (ref 12.0–46.0)
Lymphs Abs: 1.2 K/uL (ref 0.7–4.0)
MCHC: 34.5 g/dL (ref 30.0–36.0)
MCV: 108 fl — ABNORMAL HIGH (ref 78.0–100.0)
Monocytes Absolute: 0.3 K/uL (ref 0.1–1.0)
Monocytes Relative: 9.6 % (ref 3.0–12.0)
Neutro Abs: 1.9 K/uL (ref 1.4–7.7)
Neutrophils Relative %: 54 % (ref 43.0–77.0)
Platelets: 204 K/uL (ref 150.0–400.0)
RBC: 3.04 Mil/uL — ABNORMAL LOW (ref 4.22–5.81)
RDW: 15.3 % (ref 11.5–15.5)
WBC: 3.5 K/uL — ABNORMAL LOW (ref 4.0–10.5)

## 2017-05-29 LAB — HEPATIC FUNCTION PANEL
ALT: 20 U/L (ref 0–53)
AST: 27 U/L (ref 0–37)
Albumin: 3.8 g/dL (ref 3.5–5.2)
Alkaline Phosphatase: 47 U/L (ref 39–117)
BILIRUBIN DIRECT: 0.3 mg/dL (ref 0.0–0.3)
BILIRUBIN TOTAL: 1.6 mg/dL — AB (ref 0.2–1.2)
TOTAL PROTEIN: 6.6 g/dL (ref 6.0–8.3)

## 2017-05-29 LAB — MICROALBUMIN / CREATININE URINE RATIO
Creatinine,U: 165.6 mg/dL
MICROALB UR: 3 mg/dL — AB (ref 0.0–1.9)
Microalb Creat Ratio: 1.8 mg/g (ref 0.0–30.0)

## 2017-05-29 LAB — LIPID PANEL
Cholesterol: 155 mg/dL (ref 0–200)
HDL: 93.3 mg/dL (ref >39.00)
LDL Cholesterol: 40 mg/dL (ref 0–99)
NonHDL: 61.92
Total CHOL/HDL Ratio: 2
Triglycerides: 108 mg/dL (ref 0.0–149.0)
VLDL: 21.6 mg/dL (ref 0.0–40.0)

## 2017-05-29 LAB — IBC PANEL
Iron: 226 ug/dL — ABNORMAL HIGH (ref 42–165)
Saturation Ratios: 77.2 % — ABNORMAL HIGH (ref 20.0–50.0)
Transferrin: 209 mg/dL — ABNORMAL LOW (ref 212.0–360.0)

## 2017-05-29 LAB — VITAMIN D 25 HYDROXY (VIT D DEFICIENCY, FRACTURES): VITD: 17.96 ng/mL — ABNORMAL LOW (ref 30.00–100.00)

## 2017-05-29 LAB — TSH: TSH: 1.87 u[IU]/mL (ref 0.35–4.50)

## 2017-05-29 LAB — POCT GLYCOSYLATED HEMOGLOBIN (HGB A1C): Hemoglobin A1C: 5.8

## 2017-05-29 LAB — PSA: PSA: 0.5 ng/mL (ref 0.10–4.00)

## 2017-05-29 MED ORDER — BROMOCRIPTINE MESYLATE 2.5 MG PO TABS: 2.5000 mg | ORAL_TABLET | Freq: Every day | ORAL | 3 refills | Status: DC

## 2017-05-29 MED ORDER — CALCITRIOL 0.25 MCG PO CAPS: 0.2500 ug | ORAL_CAPSULE | Freq: Every day | ORAL | 3 refills | Status: DC

## 2017-05-29 MED ORDER — SILDENAFIL CITRATE 100 MG PO TABS: 100.0000 mg | ORAL_TABLET | Freq: Every day | ORAL | 3 refills | Status: DC | PRN

## 2017-05-29 MED ORDER — OMEPRAZOLE 20 MG PO CPDR: 20.0000 mg | DELAYED_RELEASE_CAPSULE | Freq: Every day | ORAL | 3 refills | Status: DC

## 2017-05-29 MED ORDER — PIOGLITAZONE HCL 45 MG PO TABS: 45.0000 mg | ORAL_TABLET | Freq: Every day | ORAL | 3 refills | Status: DC

## 2017-05-29 MED ORDER — GLUCOSE BLOOD VI STRP: 1.0000 | ORAL_STRIP | Freq: Every day | 3 refills | Status: AC

## 2017-05-29 MED ORDER — SAXAGLIPTIN HCL 5 MG PO TABS: 5.0000 mg | ORAL_TABLET | Freq: Every morning | ORAL | 3 refills | Status: DC

## 2017-05-29 MED ORDER — CARVEDILOL 3.125 MG PO TABS: 3.1250 mg | ORAL_TABLET | Freq: Two times a day (BID) | ORAL | 3 refills | Status: DC

## 2017-05-29 MED ORDER — HYDROCHLOROTHIAZIDE 25 MG PO TABS: 25.0000 mg | ORAL_TABLET | Freq: Every day | ORAL | 3 refills | Status: DC

## 2017-05-29 MED ORDER — EZETIMIBE-SIMVASTATIN 10-80 MG PO TABS: 1.0000 | ORAL_TABLET | Freq: Every day | ORAL | 3 refills | Status: DC

## 2017-05-29 NOTE — Progress Notes (Signed)
Subjective:    Patient ID: Mitchell Alvarado, male    DOB: 1950-11-18, 67 y.o.   MRN: [id number]  HPI Pt is here for regular wellness examination, and is feeling pretty well in general, and says chronic med probs are stable, except as noted below Past Medical History:  Diagnosis Date  . ANEMIA, IRON DEFICIENCY 12/23/2009  . DIABETES MELLITUS, TYPE II 12/30/2006  . ECZEMA 05/15/2009  . GERD 12/30/2006  . HYPERLIPIDEMIA 12/30/2006  . HYPERTENSION 12/30/2006  . HYPERTHYROIDISM 06/03/2008  . Pernicious anemia 12/30/2006  . Personal history of colonic polyps - adenoma 07/22/2004    Past Surgical History:  Procedure Laterality Date  . COLONOSCOPY  multiple  . ESOPHAGOGASTRODUODENOSCOPY  multiple  . TRANSURETHRAL RESECTION OF PROSTATE  2000    Social History   Socioeconomic History  . Marital status: Married    Spouse name: Not on file  . Number of children: Not on file  . Years of education: Not on file  . Highest education level: Not on file  Social Needs  . Financial resource strain: Not on file  . Food insecurity - worry: Not on file  . Food insecurity - inability: Not on file  . Transportation needs - medical: Not on file  . Transportation needs - non-medical: Not on file  Occupational History  . Occupation: Retired from school system  Tobacco Use  . Smoking status: Former Research scientist (life sciences)  . Smokeless tobacco: Never Used  Substance and Sexual Activity  . Alcohol use: Yes    Alcohol/week: 4.2 oz    Types: 7 Glasses of wine per week    Comment: 5-7 glasses wine weekly  . Drug use: No  . Sexual activity: Not on file  Other Topics Concern  . Not on file  Social History Narrative  . Not on file    Current Outpatient Medications on File Prior to Visit  Medication Sig Dispense Refill  . aspirin 81 MG tablet Take 1 tablet (81 mg total) by mouth daily. 90 tablet 3  . cyanocobalamin (,VITAMIN B-12,) 1000 MCG/ML injection INJECT 1ML INTRAMUSCULARY ONCE A MONTH 3 mL 5  . latanoprost  (XALATAN) 0.005 % ophthalmic solution Place 1 drop into both eyes at bedtime.     No current facility-administered medications on file prior to visit.     No Known Allergies  Family History  Problem Relation Age of Onset  . Heart attack Father        MI  . Depression Son   . COPD Son   . Cancer Neg Hx   . Colon cancer Neg Hx     BP 134/80 (BP Location: Left Arm, Patient Position: Sitting, Cuff Size: Normal)   Pulse 99   Temp 98.7 F (37.1 C) (Oral)   Ht 6\' 3"  (1.905 m)   Wt 264 lb 9.6 oz (120 kg)   SpO2 99%   BMI 33.07 kg/m    Review of Systems Denies fever, diplopia, earache, chest pain, back pain, anxiety, cold intolerance, BRBPR, hematuria, syncope, numbness, allergy sxs, easy bruising, and rash.  He has fatigue.      Objective:   Physical Exam VS: see vs page GEN: no distress HEAD: head: no deformity eyes: no periorbital swelling, no proptosis external nose and ears are normal mouth: no lesion seen NECK: supple, thyroid is not enlarged CHEST WALL: no deformity LUNGS: clear to auscultation BREASTS:  No gynecomastia CV: reg rate and rhythm, no murmur ABD: abdomen is soft, nontender.  no hepatosplenomegaly.  not distended.  no hernia RECTAL: normal external and internal exam.  heme neg. PROSTATE:  Normal size.  No nodule MUSCULOSKELETAL: muscle bulk and strength are grossly normal.  no obvious joint swelling.  gait is normal and steady EXTEMITIES: no deformity.  no ulcer on the feet.  feet are of normal color and temp.  no edema PULSES: dorsalis pedis intact bilat.  no carotid bruit NEURO:  cn 2-12 grossly intact.   readily moves all 4's.  sensation is intact to touch on the feet SKIN:  Normal texture and temperature.  No rash or suspicious lesion is visible.   NODES:  None palpable at the neck PSYCH: alert, well-oriented.  Does not appear anxious nor depressed.       Assessment & Plan:  Wellness visit today, with problems stable, except as  noted.   SEPARATE EVALUATION FOLLOWS--EACH PROBLEM HERE IS NEW, NOT RESPONDING TO TREATMENT, OR POSES SIGNIFICANT RISK TO THE PATIENT'S HEALTH: HISTORY OF THE PRESENT ILLNESS: AF is noted on today's ECG.  He denies palpitations PAST MEDICAL HISTORY Past Medical History:  Diagnosis Date  . ANEMIA, IRON DEFICIENCY 12/23/2009  . DIABETES MELLITUS, TYPE II 12/30/2006  . ECZEMA 05/15/2009  . GERD 12/30/2006  . HYPERLIPIDEMIA 12/30/2006  . HYPERTENSION 12/30/2006  . HYPERTHYROIDISM 06/03/2008  . Pernicious anemia 12/30/2006  . Personal history of colonic polyps - adenoma 07/22/2004    Past Surgical History:  Procedure Laterality Date  . COLONOSCOPY  multiple  . ESOPHAGOGASTRODUODENOSCOPY  multiple  . TRANSURETHRAL RESECTION OF PROSTATE  2000    Social History   Socioeconomic History  . Marital status: Married    Spouse name: Not on file  . Number of children: Not on file  . Years of education: Not on file  . Highest education level: Not on file  Social Needs  . Financial resource strain: Not on file  . Food insecurity - worry: Not on file  . Food insecurity - inability: Not on file  . Transportation needs - medical: Not on file  . Transportation needs - non-medical: Not on file  Occupational History  . Occupation: Retired from school system  Tobacco Use  . Smoking status: Former Research scientist (life sciences)  . Smokeless tobacco: Never Used  Substance and Sexual Activity  . Alcohol use: Yes    Alcohol/week: 4.2 oz    Types: 7 Glasses of wine per week    Comment: 5-7 glasses wine weekly  . Drug use: No  . Sexual activity: Not on file  Other Topics Concern  . Not on file  Social History Narrative  . Not on file    Current Outpatient Medications on File Prior to Visit  Medication Sig Dispense Refill  . aspirin 81 MG tablet Take 1 tablet (81 mg total) by mouth daily. 90 tablet 3  . cyanocobalamin (,VITAMIN B-12,) 1000 MCG/ML injection INJECT 1ML INTRAMUSCULARY ONCE A MONTH 3 mL 5  . latanoprost  (XALATAN) 0.005 % ophthalmic solution Place 1 drop into both eyes at bedtime.     No current facility-administered medications on file prior to visit.     No Known Allergies  Family History  Problem Relation Age of Onset  . Heart attack Father        MI  . Depression Son   . COPD Son   . Cancer Neg Hx   . Colon cancer Neg Hx     BP 134/80 (BP Location: Left Arm, Patient Position: Sitting, Cuff Size: Normal)   Pulse 99  Temp 98.7 F (37.1 C) (Oral)   Ht 6\' 3"  (1.905 m)   Wt 264 lb 9.6 oz (120 kg)   SpO2 99%   BMI 33.07 kg/m   REVIEW OF SYSTEMS: Denies sob PHYSICAL EXAMINATION: VITAL SIGNS:  See vs page GENERAL: no distress HEART:  irreg rhythm without murmurs noted.   LAB/XRAY RESULTS: I personally reviewed electrocardiogram tracing (today): Indication: wellness Impression: AF, with normal VR.  No MI.  No hypertrophy. Compared to 2018: AF is new IMPRESSION: AF, new PLAN:  Ref cardiol   Subjective:   Patient here for Medicare annual wellness visit and management of other chronic and acute problems.     Risk factors: advanced age    22 of Physicians Providing Medical Care to Patient:  See "snapshot"   Activities of Daily Living: In your present state of health, do you have any difficulty performing the following activities (lives with wife and dtr)?:  Preparing food and eating?: No  Bathing yourself: No  Getting dressed: No  Using the toilet:No  Moving around from place to place: No  In the past year have you fallen or had a near fall?:No    Home Safety: Has smoke detector and wears seat belts. No firearms.   Opioid Use: none   Diet and Exercise  Current exercise habits: pt says fair Dietary issues discussed: pt reports a healthy diet   Depression Screen  Q1: Over the past two weeks, have you felt down, depressed or hopeless? no  Q2: Over the past two weeks, have you felt little interest or pleasure in doing things? no   The following  portions of the patient's history were reviewed and updated as appropriate: allergies, current medications, past family history, past medical history, past social history, past surgical history and problem list.   Review of Systems  Denies hearing loss, and visual loss Objective:   Vision:  Advertising account executive, so he declines VA today.  Hearing: grossly normal Body mass index:  See vs page Msk: pt easily and quickly performs "get-up-and-go" from a sitting position.  Cognitive Impairment Assessment: cognition, memory and judgment appear normal.  remembers 3/3 at 5 minutes.  excellent recall.  can easily read and write a sentence.  alert and oriented x 3   Assessment:   Medicare wellness utd on preventive parameters    Plan:   During the course of the visit the patient was educated and counseled about appropriate screening and preventive services including:        Fall prevention is advised today  Diabetes screening  Nutrition counseling is offered  advanced directives/end of life addressed today:  see healthcare directives hyperlink.   Vaccines are updated as needed  Patient Instructions (the written plan) was given to the patient.

## 2017-05-29 NOTE — Patient Instructions (Addendum)
Please see a heart specialist.  you will receive a phone call, about a day and time for an appointment. Please consider these measures for your health:  minimize alcohol.  Do not use tobacco products.  Have a colonoscopy at least every 10 years from age 67.  Keep firearms safely stored.  Always use seat belts.  have working smoke alarms in your home.  See an eye doctor and dentist regularly.  Never drive under the influence of alcohol or drugs (including prescription drugs).  It is critically important to prevent falling down (keep floor areas well-lit, dry, and free of loose objects.  If you have a cane, walker, or wheelchair, you should use it, even for short trips around the house.  Wear flat-soled shoes.  Also, try not to rush) blood tests are requested for you today.  We'll let you know about the results.  Please come back for a follow-up appointment in 1 year.

## 2017-05-29 NOTE — Progress Notes (Signed)
we discussed code status.  pt requests full code, but would not want to be started or maintained on artificial life-support measures if there was not a reasonable chance of recovery 

## 2017-05-30 LAB — PTH, INTACT AND CALCIUM
Calcium: 8.9 mg/dL (ref 8.6–10.3)
PTH: 151 pg/mL — ABNORMAL HIGH (ref 14–64)

## 2017-06-07 ENCOUNTER — Encounter: Payer: Self-pay | Admitting: Endocrinology

## 2017-06-13 ENCOUNTER — Encounter: Payer: Self-pay | Admitting: Internal Medicine

## 2017-06-15 ENCOUNTER — Encounter: Payer: Self-pay | Admitting: Cardiovascular Disease

## 2017-06-15 ENCOUNTER — Ambulatory Visit: Payer: Medicare Other | Admitting: Cardiovascular Disease

## 2017-06-15 VITALS — BP 144/80 | HR 95 | Ht 75.0 in | Wt 264.0 lb

## 2017-06-15 DIAGNOSIS — I480 Paroxysmal atrial fibrillation: Secondary | ICD-10-CM

## 2017-06-15 DIAGNOSIS — E785 Hyperlipidemia, unspecified: Secondary | ICD-10-CM

## 2017-06-15 NOTE — Patient Instructions (Addendum)
Dr Gwenlyn Found recommends that you continue on your current medications as directed. Please refer to the Current Medication list given to you today.  Your physician has recommended that you wear a 30-day event monitor. Event monitors are medical devices that record the heart's electrical activity. Doctors most often [place] these monitors to diagnose arrhythmias. Arrhythmias are problems with the speed or rhythm of the heartbeat. The monitor is a small, portable device. You can wear one while you do your normal daily activities. This is usually used to diagnose what is causing palpitations/syncope (passing out).  Dr Gwenlyn Found recommends that you follow-up with him as needed.

## 2017-06-15 NOTE — Assessment & Plan Note (Signed)
History of hyperlipidemia on Vytorin with recent lipid profile performed 05/29/17 revealed a total cholesterol of 155, LDL 40 and HDL of 93.

## 2017-06-15 NOTE — Assessment & Plan Note (Signed)
History of A. Fib found on every since well patient visit EKG with a lot of baseline artifact. EKG today showed sinus rhythm at 84. I'm not convinced that his prior EKG showed A. Fib because I did see P waves.I'm going to get a 30 day event monitor to further evaluate.

## 2017-06-15 NOTE — Progress Notes (Signed)
06/15/2017 [name]   03-03-1951  [id number]  Primary Physician Mitchell Shin, MD Primary Cardiologist: Mitchell Harp MD Mitchell Alvarado, [place]  HPI:  Mitchell Alvarado is a 67 y.o. moderately overweight married African-American male father one daughter who is retired Engineer, structural. He was afraid by Dr. Mena Alvarado for evaluation of new onset A. Fib. This was resected recently treated hypertension, diabetes and hyperlipidemia. He does not smoke. His father did have a myocardial infarction in his 67s. He has never had a heart attack or stroke, denies chest pain or shortness of breath. He had an EKG that showed baseline artifact with what appears to be P waves but was read as A. Fib. In the office today he is in sinus rhythm. He denies palpitations, shortness of breath or dizziness.    Current Meds  Medication Sig  . aspirin 81 MG tablet Take 1 tablet (81 mg total) by mouth daily.  . bromocriptine (PARLODEL) 2.5 MG tablet Take 1 tablet (2.5 mg total) by mouth at bedtime.  . calcitRIOL (ROCALTROL) 0.25 MCG capsule Take 1 capsule (0.25 mcg total) by mouth daily. Take Monday Wednesday and Friday.  . carvedilol (COREG) 3.125 MG tablet Take 1 tablet (3.125 mg total) by mouth 2 (two) times daily.  . Cholecalciferol (VITAMIN D) 2000 units CAPS Take 2,000 Units by mouth daily.  . cyanocobalamin (,VITAMIN B-12,) 1000 MCG/ML injection INJECT 1ML INTRAMUSCULARY ONCE A MONTH  . ezetimibe-simvastatin (VYTORIN) 10-80 MG tablet Take 1 tablet by mouth daily.  Marland Kitchen glucose blood (ONETOUCH VERIO) test strip 1 each by Other route daily. And lancets 1/day  . hydrochlorothiazide (HYDRODIURIL) 25 MG tablet Take 1 tablet (25 mg total) by mouth daily.  Marland Kitchen latanoprost (XALATAN) 0.005 % ophthalmic solution Place 1 drop into both eyes at bedtime.  Marland Kitchen omeprazole (PRILOSEC) 20 MG capsule Take 1 capsule (20 mg total) by mouth daily.  . pioglitazone (ACTOS) 45 MG tablet Take 1 tablet (45 mg total) by mouth  daily.  . saxagliptin HCl (ONGLYZA) 5 MG TABS tablet Take 1 tablet (5 mg total) by mouth every morning.     No Known Allergies  Social History   Socioeconomic History  . Marital status: Married    Spouse name: Not on file  . Number of children: Not on file  . Years of education: Not on file  . Highest education level: Not on file  Occupational History  . Occupation: Retired from school system  Social Needs  . Financial resource strain: Not on file  . Food insecurity:    Worry: Not on file    Inability: Not on file  . Transportation needs:    Medical: Not on file    Non-medical: Not on file  Tobacco Use  . Smoking status: Former Research scientist (life sciences)  . Smokeless tobacco: Never Used  Substance and Sexual Activity  . Alcohol use: Yes    Alcohol/week: 4.2 oz    Types: 7 Glasses of wine per week    Comment: 5-7 glasses wine weekly  . Drug use: No  . Sexual activity: Not on file  Lifestyle  . Physical activity:    Days per week: Not on file    Minutes per session: Not on file  . Stress: Not on file  Relationships  . Social connections:    Talks on phone: Not on file    Gets together: Not on file    Attends religious service: Not on file    Active member  of club or organization: Not on file    Attends meetings of clubs or organizations: Not on file    Relationship status: Not on file  . Intimate partner violence:    Fear of current or ex partner: Not on file    Emotionally abused: Not on file    Physically abused: Not on file    Forced sexual activity: Not on file  Other Topics Concern  . Not on file  Social History Narrative  . Not on file     Review of Systems: General: negative for chills, fever, night sweats or weight changes.  Cardiovascular: negative for chest pain, dyspnea on exertion, edema, orthopnea, palpitations, paroxysmal nocturnal dyspnea or shortness of breath Dermatological: negative for rash Respiratory: negative for cough or wheezing Urologic: negative for  hematuria Abdominal: negative for nausea, vomiting, diarrhea, bright red blood per rectum, melena, or hematemesis Neurologic: negative for visual changes, syncope, or dizziness All other systems reviewed and are otherwise negative except as noted above.    Blood pressure (!) 144/80, pulse 95, height 6\' 3"  (1.905 m), weight 264 lb (119.7 kg).  General appearance: alert and no distress Neck: no adenopathy, no carotid bruit, no JVD, supple, symmetrical, trachea midline and thyroid not enlarged, symmetric, no tenderness/mass/nodules Lungs: clear to auscultation bilaterally Heart: regular rate and rhythm, S1, S2 normal, no murmur, click, rub or gallop Extremities: extremities normal, atraumatic, no cyanosis or edema Pulses: 2+ and symmetric Skin: Skin color, texture, turgor normal. No rashes or lesions Neurologic: Alert and oriented X 3, normal strength and tone. Normal symmetric reflexes. Normal coordination and gait  EKG inus rhythm at 84 without ST or T-wave changes. I personally reviewed this EKG.  ASSESSMENT AND PLAN:   HYPERTENSION History of essential hypertension with blood pressure measured at 134/80. He is on hydrochlorothiazide and carvedilol. Continue current meds at current dosing.  Dyslipidemia History of hyperlipidemia on Vytorin with recent lipid profile performed 05/29/17 revealed a total cholesterol of 155, LDL 40 and HDL of 93.  Atrial fibrillation ([place]) History of A. Fib found on every since well patient visit EKG with a lot of baseline artifact. EKG today showed sinus rhythm at 84. I'm not convinced that his prior EKG showed A. Fib because I did see P waves.I'm going to get a 30 day event monitor to further evaluate.      Mitchell Harp MD FACP,FACC,FAHA, [facility] 06/15/2017 1:56 PM

## 2017-06-15 NOTE — Assessment & Plan Note (Signed)
History of essential hypertension with blood pressure measured at 134/80. He is on hydrochlorothiazide and carvedilol. Continue current meds at current dosing.

## 2017-06-28 ENCOUNTER — Ambulatory Visit (INDEPENDENT_AMBULATORY_CARE_PROVIDER_SITE_OTHER): Payer: Medicare Other

## 2017-06-28 ENCOUNTER — Other Ambulatory Visit: Payer: Self-pay | Admitting: Cardiovascular Disease

## 2017-06-28 DIAGNOSIS — I480 Paroxysmal atrial fibrillation: Secondary | ICD-10-CM

## 2017-06-28 DIAGNOSIS — R42 Dizziness and giddiness: Secondary | ICD-10-CM

## 2017-08-08 ENCOUNTER — Encounter: Payer: Self-pay | Admitting: Cardiovascular Disease

## 2017-08-16 ENCOUNTER — Encounter: Payer: Self-pay | Admitting: Internal Medicine

## 2017-08-16 ENCOUNTER — Ambulatory Visit: Payer: Medicare Other | Admitting: Internal Medicine

## 2017-08-16 VITALS — BP 130/70 | HR 80 | Ht 73.62 in | Wt 259.4 lb

## 2017-08-16 DIAGNOSIS — K2270 Barrett's esophagus without dysplasia: Secondary | ICD-10-CM | POA: Diagnosis not present

## 2017-08-16 DIAGNOSIS — D649 Anemia, unspecified: Secondary | ICD-10-CM

## 2017-08-16 NOTE — Patient Instructions (Addendum)
   Hope you get confirmed good news on the heart monitor.  As we discussed I do not think you need me to check the esophagus anymore.  If you have heartburn regularly or swallowing difficulty - call me back.  Otherwise see you in 2022 for colonoscopy  I appreciate the opportunity to care for you. Gatha Mayer, MD, [facility]

## 2017-08-20 NOTE — Progress Notes (Signed)
Mitchell Alvarado 67 y.o. [birth year] [id number]  Assessment & Plan:   Encounter Diagnoses  Name Primary?  . Barrett's esophagus without dysplasia < 1 cm - does not meet current dx criteria Yes  . Mild chronic anemia-multifactorial, not iron deficiency, likely anemia of chronic disease, macrocytosis but is on B12 supplementation?      The patient has a history of Barrett's esophagus but according to today's guidelines he would not merit biopsy because he had less than 1 cm columnar change.  He has had intestinal metaplasia in this area but never has had any dysplasia.  We talked about the pros and cons of continuing surveillance and I told him I did not think it was needed and he has chosen not to do that.  Mild macrocytic anemia ? Does not drink excessively per chart Is on B12 TSH ok  Will call and clarify that taking B12 and EtOH use WBC is low also (commion in blacks) - but may need hematology evaluation  I appreciate the opportunity to care for this patient. CX:[id number], Hilliard Clark, MD   Subjective:   Chief Complaint: History of Barrett's esophagus  HPI The patient is here without complaints of abdominal pain heartburn or dysphagia.  He has a history of very short segment Barrett's esophagus with intestinal metaplasia less than a centimeter which actually does not meet criteria for Barrett's esophagus in today's characterization and expert recommendations and guidelines.  His last EGD was 5 years ago.  March 2014 with a 3 cm hiatal hernia and this less than 1 cm area of columnar change.  Biopsies intestinal metaplasia no dysplasia.  He continues on a PPI.  He has hemoglobin 11.3 MCV 108 and a higher iron saturation with a low TIBC.  He has been undergoing a cardiac work-up for a question of atrial fibrillation and recent ambulatory monitoring study has not shown any atrial fibrillation.  He is on B12 supplementation. No Known Allergies Current Meds  Medication Sig  . aspirin 81 MG  tablet Take 1 tablet (81 mg total) by mouth daily.  . bromocriptine (PARLODEL) 2.5 MG tablet Take 1 tablet (2.5 mg total) by mouth at bedtime.  . calcitRIOL (ROCALTROL) 0.25 MCG capsule Take 1 capsule (0.25 mcg total) by mouth daily. Take Monday Wednesday and Friday.  . carvedilol (COREG) 3.125 MG tablet Take 1 tablet (3.125 mg total) by mouth 2 (two) times daily.  . Cholecalciferol (VITAMIN D) 2000 units CAPS Take 2,000 Units by mouth daily.  . cyanocobalamin (,VITAMIN B-12,) 1000 MCG/ML injection INJECT 1ML INTRAMUSCULARY ONCE A MONTH  . ezetimibe-simvastatin (VYTORIN) 10-80 MG tablet Take 1 tablet by mouth daily.  Marland Kitchen glucose blood (ONETOUCH VERIO) test strip 1 each by Other route daily. And lancets 1/day  . hydrochlorothiazide (HYDRODIURIL) 25 MG tablet Take 1 tablet (25 mg total) by mouth daily.  Marland Kitchen latanoprost (XALATAN) 0.005 % ophthalmic solution Place 1 drop into both eyes at bedtime.  Marland Kitchen omeprazole (PRILOSEC) 20 MG capsule Take 1 capsule (20 mg total) by mouth daily.  . pioglitazone (ACTOS) 45 MG tablet Take 1 tablet (45 mg total) by mouth daily.  . saxagliptin HCl (ONGLYZA) 5 MG TABS tablet Take 1 tablet (5 mg total) by mouth every morning.   Past Medical History:  Diagnosis Date  . ANEMIA, IRON DEFICIENCY 12/23/2009  . Barrett's esophagus without dysplasia 05/2012  . DIABETES MELLITUS, TYPE II 12/30/2006  . ECZEMA 05/15/2009  . GERD 12/30/2006  . HYPERLIPIDEMIA 12/30/2006  . HYPERTENSION 12/30/2006  . HYPERTHYROIDISM  06/03/2008  . Pernicious anemia 12/30/2006  . Personal history of colonic polyps - adenoma 07/22/2004   Past Surgical History:  Procedure Laterality Date  . COLONOSCOPY  multiple  . ESOPHAGOGASTRODUODENOSCOPY  multiple  . 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   Review of Systems As above  Objective:   Physical Exam BP 130/70 (BP Location: Left Arm, Patient Position: Sitting, Cuff Size: Large)   Pulse 80   Ht 6' 1.62" (1.87 m) Comment: height measured without shoes  Wt 259 lb 6 oz (117.7 kg)   BMI 33.64 kg/m  NAD  15 minutes time spent with patient > half in counseling coordination of care

## 2017-08-21 ENCOUNTER — Encounter: Payer: Self-pay | Admitting: Internal Medicine

## 2017-08-22 ENCOUNTER — Telehealth: Payer: Self-pay

## 2017-08-22 DIAGNOSIS — D649 Anemia, unspecified: Secondary | ICD-10-CM

## 2017-08-22 DIAGNOSIS — D72819 Decreased white blood cell count, unspecified: Secondary | ICD-10-CM

## 2017-08-22 DIAGNOSIS — D7589 Other specified diseases of blood and blood-forming organs: Secondary | ICD-10-CM

## 2017-08-22 NOTE — Telephone Encounter (Signed)
Patient reports that he he is taking B12 monthly. The alcohol use is "very close" and correnct.

## 2017-08-22 NOTE — Telephone Encounter (Signed)
Left message for patient to call back  

## 2017-08-22 NOTE — Telephone Encounter (Signed)
-----   Message from Gatha Mayer, MD sent at 08/21/2017  3:12 PM EDT ----- Regarding: call and ask some ? He has a mild anemia and MCV is up  I was thinking after he left - about causes  1) Double check he is taking B12 monthly 2) SHx says 7 drinks a week - double check that - alcohol in excess could do this and need to know how much he is drinking

## 2017-08-22 NOTE — Telephone Encounter (Signed)
Patient notified of recommendations He is advised that he will hear from [facility] probably by the end of the week.

## 2017-08-22 NOTE — Addendum Note (Signed)
Addended by: Marlon Pel on: 08/22/2017 12:20 PM   Modules accepted: Orders

## 2017-08-22 NOTE — Telephone Encounter (Signed)
Referral placed.  °Left message for patient to call back. ° °

## 2017-08-22 NOTE — Telephone Encounter (Signed)
OK  I recommend he see a hematologist unless he has in past  Dx anemia, leukopenia and macrocytosis

## 2017-08-23 ENCOUNTER — Encounter: Payer: Self-pay | Admitting: Hematology

## 2017-08-23 ENCOUNTER — Telehealth: Payer: Self-pay | Admitting: Hematology

## 2017-08-23 NOTE — Telephone Encounter (Signed)
New hematology referral received from Dr. Carlean Purl for a dx of mild anemia/leukopenia/macrocytosis. Pt has been scheduled to see Dr. Irene Limbo on 6/13 at 1pm. Pt aware to arrive 30 minutes early. Letter mailed.

## 2017-08-24 NOTE — Telephone Encounter (Signed)
Patient has been scheduled for 09/07/17

## 2017-09-06 NOTE — Progress Notes (Signed)
HEMATOLOGY/ONCOLOGY CONSULTATION NOTE  Date of Service: 09/07/2017  Patient Care Team: Renato Shin, MD as PCP - General [name], MD as Attending Physician (Ophthalmology) [name], MD as Attending Physician (Urology) [name], MD as Consulting Physician (Nephrology)  CHIEF COMPLAINTS/PURPOSE OF CONSULTATION:  Anemia  HISTORY OF PRESENTING ILLNESS:   Mitchell Alvarado is a wonderful 67 y.o. male who has been referred to [place] by Dr Renato Shin for evaluation and management of anemia. The pt reports that he is doing well overall.   The pt reports that he has been taking monthly Vitamin B12 injections for the last 4-5 years, and his deficiency was first realized through routine blood work. He was taken off Metformin at the time. He has taken Omeprazole for 20 years for his Barrett's esophagus. He denies any food intolerances or thyroid problems. He notes that he has had some fatigue which he attributes to getting older.   He has seen a nephrologist several years ago and was discharged after replacing his Vitamin D satisfactorily and he denies any liver problems as well.   Most recent lab results (05/29/17) of CBC  is as follows: all values are WNL except for WBC at 3.5k, RBC at 3.04, HGB at 11.3, HCT at 32.9, MCV at 108.0.  Review of CBC w/diff over the last two years reveals stability in the above abnormalities.   On review of systems, pt reports some fatigue, stable weight, and denies problems of infection, bleeding, back pains, pain along the spine, abdominal pains, changes in bowel habits, leg selling, urinary discomfort, and any other symptoms.   On PMHx the pt reports neuropathy, prostate surgery in 2000, denies Afib. On Social Hx the pt reports 10-14 glasses of wine each week and denies any concern for excessive consumption. He denies smoking and smoked cigarettes for one year a long time ago  On Family Hx the pt reports paternal heart attack.   MEDICAL  HISTORY:  Past Medical History:  Diagnosis Date  . ANEMIA, IRON DEFICIENCY 12/23/2009  . Barrett's esophagus without dysplasia 05/2012  . DIABETES MELLITUS, TYPE II 12/30/2006  . ECZEMA 05/15/2009  . GERD 12/30/2006  . HYPERLIPIDEMIA 12/30/2006  . HYPERTENSION 12/30/2006  . HYPERTHYROIDISM 06/03/2008  . Pernicious anemia 12/30/2006  . Personal history of colonic polyps - adenoma 07/22/2004    SURGICAL HISTORY: Past Surgical History:  Procedure Laterality Date  . COLONOSCOPY  multiple  . ESOPHAGOGASTRODUODENOSCOPY  multiple  . TRANSURETHRAL RESECTION OF PROSTATE  2000    SOCIAL HISTORY: Social History   Socioeconomic History  . Marital status: Married    Spouse name: Not on file  . Number of children: Not on file  . Years of education: Not on file  . Highest education level: Not on file  Occupational History  . Occupation: Retired from school system  Social Needs  . Financial resource strain: Not on file  . Food insecurity:    Worry: Not on file    Inability: Not on file  . Transportation needs:    Medical: Not on file    Non-medical: Not on file  Tobacco Use  . Smoking status: Former Research scientist (life sciences)  . Smokeless tobacco: Never Used  Substance and Sexual Activity  . Alcohol use: Yes    Alcohol/week: 4.2 oz    Types: 7 Glasses of wine per week    Comment: 5-7 glasses wine weekly  . Drug use: No  . Sexual activity: Not on file  Lifestyle  . Physical activity:  Days per week: Not on file    Minutes per session: Not on file  . Stress: Not on file  Relationships  . Social connections:    Talks on phone: Not on file    Gets together: Not on file    Attends religious service: Not on file    Active member of club or organization: Not on file    Attends meetings of clubs or organizations: Not on file    Relationship status: Not on file  . Intimate partner violence:    Fear of current or ex partner: Not on file    Emotionally abused: Not on file    Physically abused: Not on  file    Forced sexual activity: Not on file  Other Topics Concern  . 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 Heart attack Father        MI  . Depression Son   . COPD Son   . Cancer Neg Hx   . Colon cancer Neg Hx     ALLERGIES:  has No Known Allergies.  MEDICATIONS:  Current Outpatient Medications  Medication Sig Dispense Refill  . aspirin 81 MG tablet Take 1 tablet (81 mg total) by mouth daily. 90 tablet 3  . bromocriptine (PARLODEL) 2.5 MG tablet Take 1 tablet (2.5 mg total) by mouth at bedtime. 90 tablet 3  . calcitRIOL (ROCALTROL) 0.25 MCG capsule Take 1 capsule (0.25 mcg total) by mouth daily. Take Monday Wednesday and Friday. 40 capsule 3  . carvedilol (COREG) 3.125 MG tablet Take 1 tablet (3.125 mg total) by mouth 2 (two) times daily. 180 tablet 3  . Cholecalciferol (VITAMIN D) 2000 units CAPS Take 2,000 Units by mouth daily.    . cyanocobalamin (,VITAMIN B-12,) 1000 MCG/ML injection INJECT 1ML INTRAMUSCULARY ONCE A MONTH 3 mL 5  . ezetimibe-simvastatin (VYTORIN) 10-80 MG tablet Take 1 tablet by mouth daily. 90 tablet 3  . glucose blood (ONETOUCH VERIO) test strip 1 each by Other route daily. And lancets 1/day 100 each 3  . hydrochlorothiazide (HYDRODIURIL) 25 MG tablet Take 1 tablet (25 mg total) by mouth daily. 90 tablet 3  . latanoprost (XALATAN) 0.005 % ophthalmic solution Place 1 drop into both eyes at bedtime.    Marland Kitchen omeprazole (PRILOSEC) 20 MG capsule Take 1 capsule (20 mg total) by mouth daily. 90 capsule 3  . pioglitazone (ACTOS) 45 MG tablet Take 1 tablet (45 mg total) by mouth daily. 90 tablet 3  . saxagliptin HCl (ONGLYZA) 5 MG TABS tablet Take 1 tablet (5 mg total) by mouth every morning. 90 tablet 3   No current facility-administered medications  for this visit.     REVIEW OF SYSTEMS:    10 Point review of Systems was done is negative except as noted above.  PHYSICAL EXAMINATION:  . Vitals:   09/07/17 1300  BP: 139/81  Pulse: 90  Resp: 17  Temp: 98.4 F (36.9 C)  SpO2: 100%   Filed Weights   09/07/17 1300  Weight: 259 lb 3.2 oz (117.6 kg)   .Body mass index is 33.62 kg/m.  GENERAL:alert, in no acute distress and comfortable SKIN: no acute rashes, no significant lesions EYES: conjunctiva are pink and non-injected, sclera anicteric OROPHARYNX:  MMM, no exudates, no oropharyngeal erythema or ulceration NECK: supple, no JVD LYMPH:  no palpable lymphadenopathy in the cervical, axillary or inguinal regions LUNGS: clear to auscultation b/l with normal respiratory effort HEART: regular rate & rhythm ABDOMEN:  normoactive bowel sounds , non tender, not distended. No palpable hepatosplenomegaly.  Extremity: no pedal edema PSYCH: alert & oriented x 3 with fluent speech NEURO: no focal motor/sensory deficits  LABORATORY DATA:  I have reviewed the data as listed  . CBC Latest Ref Rng & Units 09/07/2017 09/07/2017 05/29/2017  WBC 4.0 - 10.3 K/uL 3.4(L) - 3.5(L)  Hemoglobin 13.0 - 17.1 g/dL 10.9(L) - 11.3(L)  Hematocrit 37.5 - 51.0 % 31.5(L) 31.1(L) 32.9(L)  Platelets 140 - 400 K/uL 159 - 204.0   . CBC    Component Value Date/Time   WBC 3.4 (L) 09/07/2017 1405   RBC 2.90 (L) 09/07/2017 1405   HGB 10.9 (L) 09/07/2017 1405   HCT 31.5 (L) 09/07/2017 1405   HCT 31.1 (L) 09/07/2017 1405   PLT 159 09/07/2017 1405   MCV 108.6 (H) 09/07/2017 1405   MCH 37.6 (H) 09/07/2017 1405   MCHC 34.6 09/07/2017 1405   RDW 14.6 09/07/2017 1405   LYMPHSABS 1.2 09/07/2017 1405   MONOABS 0.3 09/07/2017 1405   EOSABS 0.1 09/07/2017 1405   BASOSABS 0.0 09/07/2017 1405     . CMP Latest Ref Rng & Units 09/07/2017 05/29/2017 05/29/2017  Glucose 70 - 140 mg/dL 104 - 137(H)  BUN 7 - 26 mg/dL 28(H) - 24(H)  Creatinine 0.70 - 1.30 mg/dL  1.53(H) - 1.41  Sodium 136 - 145 mmol/L 137 - 139  Potassium 3.5 - 5.1 mmol/L 4.5 - 3.9  Chloride 98 - 109 mmol/L 99 - 101  CO2 22 - 29 mmol/L 26 - 27  Calcium 8.4 - 10.4 mg/dL 9.6 8.9 8.8  Total Protein 6.4 - 8.3 g/dL 7.2 - 6.6  Total Bilirubin 0.2 - 1.2 mg/dL 1.3(H) - 1.6(H)  Alkaline Phos 40 - 150 U/L 52 - 47  AST 5 - 34 U/L 37(H) - 27  ALT 0 - 55 U/L 22 - 20   Component     Latest Ref Rng & Units 09/07/2017  IgG (Immunoglobin G), Serum     700 - 1,600 mg/dL 823  IgA     61 - 437 mg/dL 248  IgM (Immunoglobulin M), Srm     20 - 172 mg/dL 143  Total Protein ELP     6.0 - 8.5 g/dL 6.7  Albumin SerPl Elph-Mcnc     2.9 - 4.4 g/dL 4.0  Alpha 1     0.0 - 0.4 g/dL 0.2  Alpha2 Glob SerPl Elph-Mcnc     0.4 - 1.0 g/dL 0.8  B-Globulin SerPl Elph-Mcnc     0.7 - 1.3 g/dL 1.1  Gamma Glob SerPl Elph-Mcnc     0.4 - 1.8 g/dL 0.6  M Protein SerPl Elph-Mcnc     Not Observed g/dL Not Observed  Globulin, Total     2.2 - 3.9 g/dL 2.7  Albumin/Glob SerPl     0.7 - 1.7 1.5  IFE 1      Comment  Please Note (HCV):      Comment  Folate, Hemolysate     Not Estab. ng/mL 204.0  HCT     37.5 - 51.0 % 31.1 (L)  Folate, RBC     >498 ng/mL 656  Vitamin B12     180 - 914 pg/mL 6,847 (H)  TSH  0.320 - 4.118 uIU/mL 1.016  Ferritin     22 - 316 ng/mL 867 (H)  Copper     72 - 166 ug/dL 97  Parietal Cell Antibody-IgG     0.0 - 20.0 Units 10.9  Intrinsic Factor     0.0 - 1.1 AU/mL 11.8 (H)    RADIOGRAPHIC STUDIES: I have personally reviewed the radiological images as listed and agreed with the findings in the report. No results found.  ASSESSMENT & PLAN:  67 y.o. male with  1. Macrocytic Anemia Patient appears to have pernicious anemia with anti intrinsic factor antibodies. Currently no B12 or folate deficiency deficiency.but could have other B vit deficiencies. No monoclonal paraproteinemia. Concern for heavy ETOH use Has some CKD that could be contributing to the anemia as  well. No known liver disease No overt bleeding noted.  2. B12 deficiency -multifactorial -- pernicious anemia + previous metformin use + current PPI use.  PLAN -Discussed patient's most recent labs from 05/29/17, PLT normal at 204k, WBC lower at 3.5k, mild anemia with HGB at 11.3, MCV at 108.0. Normal RDW --Review of patient's CBCs w/diff over the last two years reveals stability on the above abnormalities -labs done today reviewed as noted above -Continue monthly Vitamin B12 injections replacement as per PCP -- could decrease B12 replacement given significantly elevated levels and could switch to SL B12 instead -- will defer to PCP -reasonable to empirically start taking Vitamin B complex  -Recommend that pt cut back on his ETOH consumption -Will order [place] of liver tor/o evidence of liver disease -Will check labs today and see pt back in 4 months -Replace Vitamin D as 05/29/17 labs were low at 17.96 - per PCP -If US liver in 4 months and repeat labs are stable, will discharge pt back to PCP   3. . Patient Active Problem List   Diagnosis Date Noted  . Atrial fibrillation ([place]) 05/29/2017  . Hyperparathyroidism, secondary ([place]) 05/27/2016  . Wellness examination 05/23/2014  . Screening for prostate cancer 05/23/2014  . Routine general medical examination at a health care facility 05/23/2013  . Renal insufficiency 05/21/2012  . Hyperpotassemia 05/20/2011  . Iron deficiency anemia 12/23/2009  . ECZEMA 05/15/2009  . Thyrotoxicosis 06/03/2008  . Diabetes ([place]) 12/30/2006  . Dyslipidemia 12/30/2006  . PERNICIOUS ANEMIA 12/30/2006  . HYPERTENSION 12/30/2006  . GERD 12/30/2006  . Personal history of colonic polyps - adenoma 07/22/2004  . Barrett's esophagus -less than 1 cm, does not meet current criteria for this diagnosis 01/24/2002  -Mx per PCP  Labs today [place] abd in 15 weeks RTC with Dr Irene Limbo in 16 weeks with labs   All of the patients questions were answered with apparent  satisfaction. The patient knows to call the clinic with any problems, questions or concerns.  The toal time spent in the appt was 45 minutes and more than 50% was on counseling and direct patient cares.    Sullivan Lone MD MS AAHIVMS [facility] Hematology/Oncology Physician [facility]  (Office):       [phone] (Work cell):  [phone] (Fax):           [phone]  09/07/2017 1:54 PM  I, Baldwin [place], am acting as a Education administrator for Dr Irene Limbo.   .I have reviewed the above documentation for accuracy and completeness, and I agree with the above. Brunetta Genera MD

## 2017-09-07 ENCOUNTER — Telehealth: Payer: Self-pay | Admitting: Hematology

## 2017-09-07 ENCOUNTER — Inpatient Hospital Stay: Payer: Medicare Other

## 2017-09-07 ENCOUNTER — Inpatient Hospital Stay: Payer: Medicare Other | Attending: Hematology | Admitting: Hematology

## 2017-09-07 VITALS — BP 139/81 | HR 90 | Temp 98.4°F | Resp 17 | Ht 73.62 in | Wt 259.2 lb

## 2017-09-07 DIAGNOSIS — Z87891 Personal history of nicotine dependence: Secondary | ICD-10-CM | POA: Diagnosis not present

## 2017-09-07 DIAGNOSIS — D539 Nutritional anemia, unspecified: Secondary | ICD-10-CM | POA: Insufficient documentation

## 2017-09-07 DIAGNOSIS — I10 Essential (primary) hypertension: Secondary | ICD-10-CM | POA: Diagnosis not present

## 2017-09-07 DIAGNOSIS — Z8546 Personal history of malignant neoplasm of prostate: Secondary | ICD-10-CM | POA: Insufficient documentation

## 2017-09-07 DIAGNOSIS — E538 Deficiency of other specified B group vitamins: Secondary | ICD-10-CM

## 2017-09-07 DIAGNOSIS — D72819 Decreased white blood cell count, unspecified: Secondary | ICD-10-CM

## 2017-09-07 DIAGNOSIS — N189 Chronic kidney disease, unspecified: Secondary | ICD-10-CM | POA: Insufficient documentation

## 2017-09-07 DIAGNOSIS — D510 Vitamin B12 deficiency anemia due to intrinsic factor deficiency: Secondary | ICD-10-CM

## 2017-09-07 DIAGNOSIS — E119 Type 2 diabetes mellitus without complications: Secondary | ICD-10-CM | POA: Diagnosis not present

## 2017-09-07 LAB — CMP (CANCER CENTER ONLY)
ALT: 22 U/L (ref 0–55)
AST: 37 U/L — ABNORMAL HIGH (ref 5–34)
Albumin: 4.2 g/dL (ref 3.5–5.0)
Alkaline Phosphatase: 52 U/L (ref 40–150)
Anion gap: 12 — ABNORMAL HIGH (ref 3–11)
BUN: 28 mg/dL — ABNORMAL HIGH (ref 7–26)
CO2: 26 mmol/L (ref 22–29)
Calcium: 9.6 mg/dL (ref 8.4–10.4)
Chloride: 99 mmol/L (ref 98–109)
Creatinine: 1.53 mg/dL — ABNORMAL HIGH (ref 0.70–1.30)
GFR, Est AFR Am: 53 mL/min — ABNORMAL LOW (ref >60)
GFR, Estimated: 45 mL/min — ABNORMAL LOW (ref >60)
Glucose, Bld: 104 mg/dL (ref 70–140)
Potassium: 4.5 mmol/L (ref 3.5–5.1)
Sodium: 137 mmol/L (ref 136–145)
Total Bilirubin: 1.3 mg/dL — ABNORMAL HIGH (ref 0.2–1.2)
Total Protein: 7.2 g/dL (ref 6.4–8.3)

## 2017-09-07 LAB — CBC WITH DIFFERENTIAL/PLATELET
Basophils Absolute: 0 10*3/uL (ref 0.0–0.1)
Basophils Relative: 1 %
Eosinophils Absolute: 0.1 10*3/uL (ref 0.0–0.5)
Eosinophils Relative: 2 %
HCT: 31.5 % — ABNORMAL LOW (ref 38.4–49.9)
Hemoglobin: 10.9 g/dL — ABNORMAL LOW (ref 13.0–17.1)
Lymphocytes Relative: 35 %
Lymphs Abs: 1.2 10*3/uL (ref 0.9–3.3)
MCH: 37.6 pg — ABNORMAL HIGH (ref 27.2–33.4)
MCHC: 34.6 g/dL (ref 32.0–36.0)
MCV: 108.6 fL — ABNORMAL HIGH (ref 79.3–98.0)
Monocytes Absolute: 0.3 10*3/uL (ref 0.1–0.9)
Monocytes Relative: 9 %
Neutro Abs: 1.8 10*3/uL (ref 1.5–6.5)
Neutrophils Relative %: 53 %
Platelets: 159 10*3/uL (ref 140–400)
RBC: 2.9 MIL/uL — ABNORMAL LOW (ref 4.20–5.82)
RDW: 14.6 % (ref 11.0–14.6)
WBC: 3.4 10*3/uL — ABNORMAL LOW (ref 4.0–10.3)

## 2017-09-07 LAB — VITAMIN B12: Vitamin B-12: 6847 pg/mL — ABNORMAL HIGH (ref 180–914)

## 2017-09-07 LAB — TSH: TSH: 1.016 u[IU]/mL (ref 0.320–4.118)

## 2017-09-07 LAB — FERRITIN: Ferritin: 867 ng/mL — ABNORMAL HIGH (ref 22–316)

## 2017-09-07 NOTE — Telephone Encounter (Signed)
Appointments scheduled AVS/Calendar printed per 6/13 los °

## 2017-09-08 LAB — ANTI-PARIETAL ANTIBODY: Parietal Cell Antibody-IgG: 10.9 Units (ref 0.0–20.0)

## 2017-09-08 LAB — INTRINSIC FACTOR ANTIBODIES: Intrinsic Factor: 11.8 AU/mL — ABNORMAL HIGH (ref 0.0–1.1)

## 2017-09-08 LAB — FOLATE RBC
FOLATE, HEMOLYSATE: 204 ng/mL
Folate, RBC: 656 ng/mL (ref >498)
Hematocrit: 31.1 % — ABNORMAL LOW (ref 37.5–51.0)

## 2017-09-09 LAB — COPPER, SERUM: Copper: 97 ug/dL (ref 72–166)

## 2017-09-10 LAB — MULTIPLE MYELOMA PANEL, SERUM
ALBUMIN SERPL ELPH-MCNC: 4 g/dL (ref 2.9–4.4)
ALPHA 1: 0.2 g/dL (ref 0.0–0.4)
Albumin/Glob SerPl: 1.5 (ref 0.7–1.7)
Alpha2 Glob SerPl Elph-Mcnc: 0.8 g/dL (ref 0.4–1.0)
B-Globulin SerPl Elph-Mcnc: 1.1 g/dL (ref 0.7–1.3)
Gamma Glob SerPl Elph-Mcnc: 0.6 g/dL (ref 0.4–1.8)
Globulin, Total: 2.7 g/dL (ref 2.2–3.9)
IGA: 248 mg/dL (ref 61–437)
IGM (IMMUNOGLOBULIN M), SRM: 143 mg/dL (ref 20–172)
IgG (Immunoglobin G), Serum: 823 mg/dL (ref 700–1600)
Total Protein ELP: 6.7 g/dL (ref 6.0–8.5)

## 2017-11-28 ENCOUNTER — Encounter: Payer: Self-pay | Admitting: Family Medicine

## 2017-11-28 ENCOUNTER — Ambulatory Visit: Payer: Medicare Other | Admitting: Family Medicine

## 2017-11-28 VITALS — BP 134/74 | HR 82 | Temp 98.4°F | Ht 74.0 in | Wt 257.4 lb

## 2017-11-28 DIAGNOSIS — N183 Chronic kidney disease, stage 3 (moderate): Secondary | ICD-10-CM

## 2017-11-28 DIAGNOSIS — E785 Hyperlipidemia, unspecified: Secondary | ICD-10-CM

## 2017-11-28 DIAGNOSIS — I152 Hypertension secondary to endocrine disorders: Secondary | ICD-10-CM

## 2017-11-28 DIAGNOSIS — E1169 Type 2 diabetes mellitus with other specified complication: Secondary | ICD-10-CM

## 2017-11-28 DIAGNOSIS — E1159 Type 2 diabetes mellitus with other circulatory complications: Secondary | ICD-10-CM | POA: Diagnosis not present

## 2017-11-28 DIAGNOSIS — E1122 Type 2 diabetes mellitus with diabetic chronic kidney disease: Secondary | ICD-10-CM | POA: Diagnosis not present

## 2017-11-28 DIAGNOSIS — Z23 Encounter for immunization: Secondary | ICD-10-CM

## 2017-11-28 DIAGNOSIS — I10 Essential (primary) hypertension: Secondary | ICD-10-CM

## 2017-11-28 LAB — POCT GLYCOSYLATED HEMOGLOBIN (HGB A1C): Hemoglobin A1C: 4.8 % (ref 4.0–5.6)

## 2017-11-28 NOTE — Assessment & Plan Note (Signed)
At goal on Coreg 3.125 mg daily and HCTZ 25 mg daily.

## 2017-11-28 NOTE — Patient Instructions (Signed)
It was very nice to see you today!  Please stop the bromocriptine.  No other medication changes today.  We will give you your pneumonia shot today.  Please get your flu shot next month.  Come back to see me in 6 months for your next visit with blood work.   Take care, Dr Jerline Pain

## 2017-11-28 NOTE — Assessment & Plan Note (Signed)
A1c 4.8 today.  We will stop his bromocriptine.  Continue Actos and Onglyza.  Follow-up with me in 6 months.  Recheck A1c at that point.  If still within goal, would stop Actos.  Pneumovax given today.

## 2017-11-28 NOTE — Assessment & Plan Note (Signed)
Check BMET with next blood draw. Avoid nephrotoxic meds.

## 2017-11-28 NOTE — Progress Notes (Signed)
   Subjective:  Mitchell Alvarado is a 67 y.o. male who presents today with a chief complaint of T2DM and to transfer care.   HPI:  T2DM, chronic problem, stable Currently on bromocriptine 2.5mg  daily, actos 45mg  daily, and saxagliptin 5mg  daily.  He is compliant with all his medications without side effects.  Hypertension, chronic problem, stable Currently on Coreg 3.125 mg twice a day and HCTZ 25 mg daily.  Tolerating both these without side effects.  Dyslipidemia, chronic problem, stable Currently on Vytorin.  Tolerating this well without reported side effects.  ROS: Per HPI  PMH: He reports that he has quit smoking. He has never used smokeless tobacco. He reports that he drinks about 7.0 standard drinks of alcohol per week. He reports that he does not use drugs.  Objective:  Physical Exam: BP 134/74 (BP Location: Left Arm, Patient Position: Sitting, Cuff Size: Normal)   Pulse 82   Temp 98.4 F (36.9 C) (Oral)   Ht 6\' 2"  (1.88 m)   Wt 257 lb 6.4 oz (116.8 kg)   SpO2 97%   BMI 33.05 kg/m   Gen: NAD, resting comfortably CV: RRR with no murmurs appreciated Pulm: NWOB, CTAB with no crackles, wheezes, or rhonchi  Results for orders placed or performed in visit on 11/28/17 (from the past 24 hour(s))  POCT glycosylated hemoglobin (Hb A1C)     Status: None   Collection Time: 11/28/17  2:26 PM  Result Value Ref Range   Hemoglobin A1C 4.8 4.0 - 5.6 %   HbA1c POC (<> result, manual entry)     HbA1c, POC (prediabetic range)     HbA1c, POC (controlled diabetic range)       Assessment/Plan:  Diabetes (HCC) A1c 4.8 today.  We will stop his bromocriptine.  Continue Actos and Onglyza.  Follow-up with me in 6 months.  Recheck A1c at that point.  If still within goal, would stop Actos.  Pneumovax given today.  Hypertension associated with diabetes ([place]) At goal on Coreg 3.125 mg daily and HCTZ 25 mg daily.  Dyslipidemia associated with type 2 diabetes mellitus ([place]) Continue  Vytorin.  Check lipid panel with next blood draw.  CKD stage 3 secondary to diabetes [facility]) Check BMET with next blood draw. Avoid nephrotoxic meds.   Preventative Healthcare Patient deferred flu shot today and will come back in 1 month.    Algis Greenhouse. Jerline Pain, MD 11/28/2017 3:26 PM

## 2017-11-28 NOTE — Assessment & Plan Note (Signed)
Continue Vytorin.  Check lipid panel with next blood draw.

## 2017-12-20 ENCOUNTER — Encounter: Payer: Self-pay | Admitting: Family Medicine

## 2017-12-25 ENCOUNTER — Ambulatory Visit (HOSPITAL_COMMUNITY)
Admission: RE | Admit: 2017-12-25 | Discharge: 2017-12-25 | Disposition: A | Discharge status: Home / Self Care | Payer: Medicare Other | Source: Ambulatory Visit | Attending: Hematology | Admitting: Hematology

## 2017-12-25 DIAGNOSIS — D539 Nutritional anemia, unspecified: Secondary | ICD-10-CM | POA: Insufficient documentation

## 2017-12-25 DIAGNOSIS — D72819 Decreased white blood cell count, unspecified: Secondary | ICD-10-CM

## 2017-12-25 DIAGNOSIS — E538 Deficiency of other specified B group vitamins: Secondary | ICD-10-CM | POA: Insufficient documentation

## 2017-12-25 DIAGNOSIS — N281 Cyst of kidney, acquired: Secondary | ICD-10-CM | POA: Diagnosis not present

## 2017-12-27 NOTE — Progress Notes (Signed)
HEMATOLOGY/ONCOLOGY CONSULTATION NOTE  Date of Service: 12/28/2017  Patient Care Team: [name], MD as PCP - General (Family Medicine) [name], MD as Attending Physician (Ophthalmology) [name], MD as Attending Physician (Urology) [name], MD as Consulting Physician (Nephrology)  CHIEF COMPLAINTS/PURPOSE OF CONSULTATION:  Anemia  HISTORY OF PRESENTING ILLNESS:   Mitchell Alvarado is a wonderful 67 y.o. male who has been referred to [place] by Dr Renato Shin for evaluation and management of anemia. The pt reports that he is doing well overall.   The pt reports that he has been taking monthly Vitamin B12 injections for the last 4-5 years, and his deficiency was first realized through routine blood work. He was taken off Metformin at the time. He has taken Omeprazole for 20 years for his Barrett's esophagus. He denies any food intolerances or thyroid problems. He notes that he has had some fatigue which he attributes to getting older.   He has seen a nephrologist several years ago and was discharged after replacing his Vitamin D satisfactorily and he denies any liver problems as well.   Most recent lab results (05/29/17) of CBC  is as follows: all values are WNL except for WBC at 3.5k, RBC at 3.04, HGB at 11.3, HCT at 32.9, MCV at 108.0.  Review of CBC w/diff over the last two years reveals stability in the above abnormalities.   On review of systems, pt reports some fatigue, stable weight, and denies problems of infection, bleeding, back pains, pain along the spine, abdominal pains, changes in bowel habits, leg selling, urinary discomfort, and any other symptoms.   On PMHx the pt reports neuropathy, prostate surgery in 2000, denies Afib. On Social Hx the pt reports 10-14 glasses of wine each week  and denies any concern for excessive consumption. He denies smoking and smoked cigarettes for one year a long time ago  On Family Hx the pt reports paternal heart  attack.  Interval History:   Mitchell Alvarado returns today for management and evaluation of his macrocytic anemia. The patient's last visit with [place] was on 09/07/17. The pt reports that he is doing well overall.   The pt reports that he is staying fairly active, endorses good energy levels, and notes that he has been eating well. He has continued to get monthly B12 injections with his PCP and has continued to take a Vitamin B complex daily. He notes that he has taken Prilosec for at least 20 years. He denies any recent infections.   The pt notes that he has had a glass of wine a couple times a week, noting that he has cut back.   The pt notes that he took PO Iron replacement for at least 5 years until a year and a half ago.   Of note since the patient's last visit, pt has had US Abdomen completed on 12/25/17 with results revealing Probable fatty infiltration of liver. Tiny LEFT renal cyst. Otherwise normal exam.  Lab results today (12/28/17) of CBC w/diff is as follows: all values are WNL except for WBC at 2.8k, RBC at 2.96, HGB at 10.6, HCT at 31.5, MCV at 106.4, MCH at 35.9, ANC at 1.2k.  On review of systems, pt reports eating well, stable energy levels, and denies recent infections, new bone pains, SOB, abdominal pains, CP, and any other symptoms.    MEDICAL HISTORY:  Past Medical History:  Diagnosis Date  . ANEMIA, IRON DEFICIENCY 12/23/2009  . Barrett's esophagus without  dysplasia 05/2012  . DIABETES MELLITUS, TYPE II 12/30/2006  . ECZEMA 05/15/2009  . GERD 12/30/2006  . HYPERLIPIDEMIA 12/30/2006  . HYPERTENSION 12/30/2006  . HYPERTHYROIDISM 06/03/2008  . Pernicious anemia 12/30/2006  . Personal history of colonic polyps - adenoma 07/22/2004    SURGICAL HISTORY: Past Surgical History:  Procedure Laterality Date  . COLONOSCOPY  multiple  . ESOPHAGOGASTRODUODENOSCOPY  multiple  . TRANSURETHRAL RESECTION OF PROSTATE  2000    SOCIAL HISTORY: Social History   Socioeconomic History   . Marital status: Married    Spouse name: Not on file  . Number of children: Not on file  . Years of education: Not on file  . Highest education level: Not on file  Occupational History  . Occupation: Retired from school system  Social Needs  . Financial resource strain: Not on file  . Food insecurity:    Worry: Not on file    Inability: Not on file  . Transportation needs:    Medical: Not on file    Non-medical: Not on file  Tobacco Use  . Smoking status: Former Research scientist (life sciences)  . Smokeless tobacco: Never Used  Substance and Sexual Activity  . Alcohol use: Yes    Alcohol/week: 7.0 standard drinks    Types: 7 Glasses of wine per week    Comment: 5-7 glasses wine weekly  . Drug use: No  . Sexual activity: Not on file  Lifestyle  . Physical activity:    Days per week: Not on file    Minutes per session: Not on file  . Stress: Not on file  Relationships  . Social connections:    Talks on phone: Not on file    Gets together: Not on file    Attends religious service: Not on file    Active member of club or organization: Not on file    Attends meetings of clubs or organizations: Not on file    Relationship status: Not on file  . Intimate partner violence:    Fear of current or ex partner: Not on file    Emotionally abused: Not on file    Physically abused: Not on file    Forced sexual activity: Not on file  Other Topics Concern  . 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 Heart attack Father        MI  . Depression Son   . COPD Son   . Cancer Neg Hx   . Colon cancer Neg Hx     ALLERGIES:  has No Known Allergies.  MEDICATIONS:  Current Outpatient Medications  Medication Sig Dispense Refill  . aspirin 81 MG tablet Take 1 tablet (81 mg total) by mouth  daily. 90 tablet 3  . calcitRIOL (ROCALTROL) 0.25 MCG capsule Take 1 capsule (0.25 mcg total) by mouth daily. Take Monday Wednesday and Friday. 40 capsule 3  . carvedilol (COREG) 3.125 MG tablet Take 1 tablet (3.125 mg total) by mouth 2 (two) times daily. 180 tablet 3  . Cholecalciferol (VITAMIN D) 2000 units CAPS Take 2,000 Units by mouth daily.    . cyanocobalamin (,VITAMIN B-12,) 1000 MCG/ML injection INJECT 1ML INTRAMUSCULARY ONCE A MONTH 3 mL 5  . ezetimibe-simvastatin (VYTORIN) 10-80 MG tablet Take 1 tablet by  mouth daily. 90 tablet 3  . glucose blood (ONETOUCH VERIO) test strip 1 each by Other route daily. And lancets 1/day 100 each 3  . hydrochlorothiazide (HYDRODIURIL) 25 MG tablet Take 1 tablet (25 mg total) by mouth daily. 90 tablet 3  . latanoprost (XALATAN) 0.005 % ophthalmic solution Place 1 drop into both eyes at bedtime.    Marland Kitchen omeprazole (PRILOSEC) 20 MG capsule Take 1 capsule (20 mg total) by mouth daily. 90 capsule 3  . pioglitazone (ACTOS) 45 MG tablet Take 1 tablet (45 mg total) by mouth daily. 90 tablet 3  . saxagliptin HCl (ONGLYZA) 5 MG TABS tablet Take 1 tablet (5 mg total) by mouth every morning. 90 tablet 3   No current facility-administered medications for this visit.     REVIEW OF SYSTEMS:    A 10+ POINT REVIEW OF SYSTEMS WAS OBTAINED including neurology, dermatology, psychiatry, cardiac, respiratory, lymph, extremities, GI, GU, Musculoskeletal, constitutional, breasts, reproductive, HEENT.  All pertinent positives are noted in the HPI.  All others are negative.   PHYSICAL EXAMINATION:  . Vitals:   12/28/17 1430  BP: (!) 162/69  Pulse: 81  Resp: 18  Temp: 98.3 F (36.8 C)  SpO2: 100%   Filed Weights   12/28/17 1430  Weight: 261 lb 8 oz (118.6 kg)   .Body mass index is 33.57 kg/m.  GENERAL:alert, in no acute distress and comfortable SKIN: no acute rashes, no significant lesions EYES: conjunctiva are pink and non-injected, sclera  anicteric OROPHARYNX: MMM, no exudates, no oropharyngeal erythema or ulceration NECK: supple, no JVD LYMPH:  no palpable lymphadenopathy in the cervical, axillary or inguinal regions LUNGS: clear to auscultation b/l with normal respiratory effort HEART: regular rate & rhythm ABDOMEN:  normoactive bowel sounds , non tender, not distended. No palpable hepatosplenomegaly.  Extremity: no pedal edema PSYCH: alert & oriented x 3 with fluent speech NEURO: no focal motor/sensory deficits   LABORATORY DATA:  I have reviewed the data as listed  . CBC Latest Ref Rng & Units 12/28/2017 09/07/2017 09/07/2017  WBC 4.0 - 10.3 K/uL 2.8(L) 3.4(L) -  Hemoglobin 13.0 - 17.1 g/dL 10.6(L) 10.9(L) -  Hematocrit 38.4 - 49.9 % 31.5(L) 31.5(L) 31.1(L)  Platelets 140 - 400 K/uL 174 159 -   . CBC    Component Value Date/Time   WBC 2.8 (L) 12/28/2017 1314   RBC 2.96 (L) 12/28/2017 1314   HGB 10.6 (L) 12/28/2017 1314   HCT 31.5 (L) 12/28/2017 1314   HCT 31.1 (L) 09/07/2017 1405   PLT 174 12/28/2017 1314   MCV 106.4 (H) 12/28/2017 1314   MCH 35.9 (H) 12/28/2017 1314   MCHC 33.7 12/28/2017 1314   RDW 12.5 12/28/2017 1314   LYMPHSABS 1.2 12/28/2017 1314   MONOABS 0.3 12/28/2017 1314   EOSABS 0.1 12/28/2017 1314   BASOSABS 0.0 12/28/2017 1314     . CMP Latest Ref Rng & Units 09/07/2017 05/29/2017 05/29/2017  Glucose 70 - 140 mg/dL 104 - 137(H)  BUN 7 - 26 mg/dL 28(H) - 24(H)  Creatinine 0.70 - 1.30 mg/dL 1.53(H) - 1.41  Sodium 136 - 145 mmol/L 137 - 139  Potassium 3.5 - 5.1 mmol/L 4.5 - 3.9  Chloride 98 - 109 mmol/L 99 - 101  CO2 22 - 29 mmol/L 26 - 27  Calcium 8.4 - 10.4 mg/dL 9.6 8.9 8.8  Total Protein 6.4 - 8.3 g/dL 7.2 - 6.6  Total Bilirubin 0.2 - 1.2 mg/dL 1.3(H) - 1.6(H)  Alkaline Phos 40 - 150 U/L  52 - 47  AST 5 - 34 U/L 37(H) - 27  ALT 0 - 55 U/L 22 - 20   Component     Latest Ref Rng & Units 09/07/2017  IgG (Immunoglobin G), Serum     700 - 1,600 mg/dL 823  IgA     61 - 437 mg/dL 248   IgM (Immunoglobulin M), Srm     20 - 172 mg/dL 143  Total Protein ELP     6.0 - 8.5 g/dL 6.7  Albumin SerPl Elph-Mcnc     2.9 - 4.4 g/dL 4.0  Alpha 1     0.0 - 0.4 g/dL 0.2  Alpha2 Glob SerPl Elph-Mcnc     0.4 - 1.0 g/dL 0.8  B-Globulin SerPl Elph-Mcnc     0.7 - 1.3 g/dL 1.1  Gamma Glob SerPl Elph-Mcnc     0.4 - 1.8 g/dL 0.6  M Protein SerPl Elph-Mcnc     Not Observed g/dL Not Observed  Globulin, Total     2.2 - 3.9 g/dL 2.7  Albumin/Glob SerPl     0.7 - 1.7 1.5  IFE 1      Comment  Please Note (HCV):      Comment  Folate, Hemolysate     Not Estab. ng/mL 204.0  HCT     37.5 - 51.0 % 31.1 (L)  Folate, RBC     >498 ng/mL 656  Vitamin B12     180 - 914 pg/mL 6,847 (H)  TSH     0.320 - 4.118 uIU/mL 1.016  Ferritin     22 - 316 ng/mL 867 (H)  Copper     72 - 166 ug/dL 97  Parietal Cell Antibody-IgG     0.0 - 20.0 Units 10.9  Intrinsic Factor     0.0 - 1.1 AU/mL 11.8 (H)    RADIOGRAPHIC STUDIES: I have personally reviewed the radiological images as listed and agreed with the findings in the report. US Abdomen Complete  Result Date: 12/25/2017 CLINICAL DATA:  Macrocytic anemia, leukopenia, heavy ethanol use, question liver pathology and splenomegaly; history hypertension, hyperlipidemia, type II diabetes mellitus, GERD EXAM: ABDOMEN ULTRASOUND COMPLETE COMPARISON:  None FINDINGS: Gallbladder: Normally distended without stones or wall thickening. No pericholecystic fluid or sonographic Murphy sign. Common bile duct: Diameter: Normal caliber 5 mm diameter Liver: Echogenic parenchyma, likely fatty infiltration though this can be seen with cirrhosis and certain infiltrative disorders. No focal hepatic mass or nodularity. Portal vein is patent on color Doppler imaging with normal direction of blood flow towards the liver. IVC: Normal appearance Pancreas: Normal appearance Spleen: Normal size and appearance, 5.6 cm length Right Kidney: Length: 10.5 cm. Normal morphology without  mass or hydronephrosis. Left Kidney: Length: 11.4 cm. Tiny cyst at inferior pole 11 x 8 x 11 mm Abdominal aorta: Normal caliber Other findings: No free fluid IMPRESSION: Probable fatty infiltration of liver. Tiny LEFT renal cyst. Otherwise normal exam. Electronically Signed   By: Lavonia Dana M.D.   On: 12/25/2017 14:10    ASSESSMENT & PLAN:  67 y.o. male with  1. Macrocytic Anemia Patient appears to have pernicious anemia with anti intrinsic factor antibodies. Currently no B12 or folate deficiency deficiency.but could have other B vit deficiencies. No monoclonal paraproteinemia. Concern for heavy ETOH use Has some CKD that could be contributing to the anemia as well. No known liver disease No overt bleeding noted.  2. B12 deficiency -multifactorial -- pernicious anemia + previous metformin use + current PPI use.  PLAN -Review of patient's CBCs w/diff over the last two years reveals stability on the above abnormalities -Continue monthly Vitamin B12 injections replacement as per PCP -- could decrease B12 replacement given significantly elevated levels and could switch to SL B12 instead -- will defer to PCP -reasonable to empirically start taking Vitamin B complex  -Recommend that pt cut back on his ETOH consumption -Replace Vitamin D as 05/29/17 labs were low at 17.96 - per PCP -Discussed pt labwork today, 12/28/17; HGB at 10.6, some neutropenia with ANC at 1.2k -Discussed that I would like to watch counts again in 4-6 months as the pt has some new neutropenia -If counts remain stable on repeated labs, will consider discharge back to PCP -Discussed the 12/25/17 US Abdomen which revealed Probable fatty infiltration of liver. Tiny LEFT renal cyst. Otherwise normal exam.  -Recommend PCP consider Elastography of liver to r/o liver cirrhosis -If counts worsen without further explanation, would need to examine BM Bx -Will see the pt back in 4-6 months   3. . Patient Active Problem List    Diagnosis Date Noted  . Hyperparathyroidism, secondary ([place]) 05/27/2016  . CKD stage 3 secondary to diabetes ([employer]) 05/21/2012  . Iron deficiency anemia 12/23/2009  . ECZEMA 05/15/2009  . Diabetes ([place]) 12/30/2006  . Dyslipidemia associated with type 2 diabetes mellitus ([place]) 12/30/2006  . PERNICIOUS ANEMIA 12/30/2006  . Hypertension associated with diabetes ([place]) 12/30/2006  . GERD 12/30/2006  . Personal history of colonic polyps - adenoma 07/22/2004  . Barrett's esophagus -less than 1 cm, does not meet current criteria for this diagnosis 01/24/2002  -Mx per PCP   RTC with Dr Irene Limbo in 6 months with labs    All of the patients questions were answered with apparent satisfaction. The patient knows to call the clinic with any problems, questions or concerns.  The total time spent in the appt was 25 minutes and more than 50% was on counseling and direct patient cares.     Sullivan Lone MD MS AAHIVMS [facility] Hematology/Oncology Physician [facility]  (Office):       [phone] (Work cell):  [phone] (Fax):           [phone]  12/28/2017 3:18 PM  I, Baldwin [place], am acting as a scribe for Dr. Irene Limbo  .I have reviewed the above documentation for accuracy and completeness, and I agree with the above. Brunetta Genera MD

## 2017-12-28 ENCOUNTER — Telehealth: Payer: Self-pay

## 2017-12-28 ENCOUNTER — Inpatient Hospital Stay: Payer: Medicare Other | Admitting: Hematology

## 2017-12-28 ENCOUNTER — Inpatient Hospital Stay: Payer: Medicare Other | Attending: Hematology

## 2017-12-28 VITALS — BP 162/69 | HR 81 | Temp 98.3°F | Resp 18 | Ht 74.0 in | Wt 261.5 lb

## 2017-12-28 DIAGNOSIS — Z87891 Personal history of nicotine dependence: Secondary | ICD-10-CM

## 2017-12-28 DIAGNOSIS — D510 Vitamin B12 deficiency anemia due to intrinsic factor deficiency: Secondary | ICD-10-CM

## 2017-12-28 DIAGNOSIS — Z79899 Other long term (current) drug therapy: Secondary | ICD-10-CM | POA: Diagnosis not present

## 2017-12-28 DIAGNOSIS — N281 Cyst of kidney, acquired: Secondary | ICD-10-CM | POA: Diagnosis not present

## 2017-12-28 DIAGNOSIS — D709 Neutropenia, unspecified: Secondary | ICD-10-CM | POA: Insufficient documentation

## 2017-12-28 DIAGNOSIS — E538 Deficiency of other specified B group vitamins: Secondary | ICD-10-CM

## 2017-12-28 DIAGNOSIS — D72819 Decreased white blood cell count, unspecified: Secondary | ICD-10-CM

## 2017-12-28 DIAGNOSIS — D539 Nutritional anemia, unspecified: Secondary | ICD-10-CM | POA: Diagnosis not present

## 2017-12-28 DIAGNOSIS — N183 Chronic kidney disease, stage 3 (moderate): Secondary | ICD-10-CM | POA: Diagnosis not present

## 2017-12-28 LAB — CBC WITH DIFFERENTIAL/PLATELET
BASOS ABS: 0 10*3/uL (ref 0.0–0.1)
Basophils Relative: 1 %
EOS PCT: 2 %
Eosinophils Absolute: 0.1 10*3/uL (ref 0.0–0.5)
HEMATOCRIT: 31.5 % — AB (ref 38.4–49.9)
Hemoglobin: 10.6 g/dL — ABNORMAL LOW (ref 13.0–17.1)
LYMPHS ABS: 1.2 10*3/uL (ref 0.9–3.3)
LYMPHS PCT: 42 %
MCH: 35.9 pg — AB (ref 27.2–33.4)
MCHC: 33.7 g/dL (ref 32.0–36.0)
MCV: 106.4 fL — AB (ref 79.3–98.0)
Monocytes Absolute: 0.3 10*3/uL (ref 0.1–0.9)
Monocytes Relative: 11 %
NEUTROS ABS: 1.2 10*3/uL — AB (ref 1.5–6.5)
Neutrophils Relative %: 44 %
PLATELETS: 174 10*3/uL (ref 140–400)
RBC: 2.96 MIL/uL — AB (ref 4.20–5.82)
RDW: 12.5 % (ref 11.0–14.6)
WBC: 2.8 10*3/uL — AB (ref 4.0–10.3)

## 2017-12-28 NOTE — Telephone Encounter (Signed)
Printed avs and calender of upcoming appointment. Per 10/3 los 

## 2018-01-19 LAB — HM DIABETES EYE EXAM

## 2018-02-01 ENCOUNTER — Encounter: Payer: Self-pay | Admitting: Physical Therapy

## 2018-04-09 ENCOUNTER — Other Ambulatory Visit: Payer: Self-pay | Admitting: Endocrinology

## 2018-04-09 NOTE — Telephone Encounter (Signed)
Please refill x 3 months Further refills would have to be considered by new PCP   

## 2018-04-09 NOTE — Telephone Encounter (Signed)
Please advise if refill is appropriate 

## 2018-05-30 ENCOUNTER — Ambulatory Visit: Payer: Medicare Other | Admitting: Endocrinology

## 2018-05-30 ENCOUNTER — Encounter: Payer: Medicare Other | Admitting: Endocrinology

## 2018-06-01 ENCOUNTER — Encounter: Payer: Medicare Other | Admitting: Family Medicine

## 2018-06-04 ENCOUNTER — Encounter: Payer: Self-pay | Admitting: Family Medicine

## 2018-06-04 ENCOUNTER — Ambulatory Visit (INDEPENDENT_AMBULATORY_CARE_PROVIDER_SITE_OTHER): Payer: Medicare Other | Admitting: Family Medicine

## 2018-06-04 VITALS — BP 138/78 | HR 80 | Temp 98.5°F | Ht 74.0 in | Wt 256.2 lb

## 2018-06-04 DIAGNOSIS — Z125 Encounter for screening for malignant neoplasm of prostate: Secondary | ICD-10-CM

## 2018-06-04 DIAGNOSIS — N183 Chronic kidney disease, stage 3 (moderate): Secondary | ICD-10-CM | POA: Diagnosis not present

## 2018-06-04 DIAGNOSIS — I152 Hypertension secondary to endocrine disorders: Secondary | ICD-10-CM

## 2018-06-04 DIAGNOSIS — D509 Iron deficiency anemia, unspecified: Secondary | ICD-10-CM | POA: Diagnosis not present

## 2018-06-04 DIAGNOSIS — N2581 Secondary hyperparathyroidism of renal origin: Secondary | ICD-10-CM

## 2018-06-04 DIAGNOSIS — Z0001 Encounter for general adult medical examination with abnormal findings: Secondary | ICD-10-CM | POA: Diagnosis not present

## 2018-06-04 DIAGNOSIS — E1122 Type 2 diabetes mellitus with diabetic chronic kidney disease: Secondary | ICD-10-CM

## 2018-06-04 DIAGNOSIS — E1169 Type 2 diabetes mellitus with other specified complication: Secondary | ICD-10-CM | POA: Diagnosis not present

## 2018-06-04 DIAGNOSIS — E785 Hyperlipidemia, unspecified: Secondary | ICD-10-CM | POA: Diagnosis not present

## 2018-06-04 DIAGNOSIS — E1159 Type 2 diabetes mellitus with other circulatory complications: Secondary | ICD-10-CM | POA: Diagnosis not present

## 2018-06-04 DIAGNOSIS — I10 Essential (primary) hypertension: Secondary | ICD-10-CM

## 2018-06-04 DIAGNOSIS — E538 Deficiency of other specified B group vitamins: Secondary | ICD-10-CM | POA: Diagnosis not present

## 2018-06-04 LAB — HEMOGLOBIN A1C: Hgb A1c MFr Bld: 5.7 % (ref 4.6–6.5)

## 2018-06-04 LAB — COMPREHENSIVE METABOLIC PANEL
ALT: 33 U/L (ref 0–53)
AST: 39 U/L — ABNORMAL HIGH (ref 0–37)
Albumin: 3.9 g/dL (ref 3.5–5.2)
Alkaline Phosphatase: 57 U/L (ref 39–117)
BUN: 25 mg/dL — ABNORMAL HIGH (ref 6–23)
CO2: 26 meq/L (ref 19–32)
Calcium: 8.9 mg/dL (ref 8.4–10.5)
Chloride: 103 mEq/L (ref 96–112)
Creatinine, Ser: 1.58 mg/dL — ABNORMAL HIGH (ref 0.40–1.50)
GFR: 53.03 mL/min — ABNORMAL LOW (ref >60.00)
Glucose, Bld: 103 mg/dL — ABNORMAL HIGH (ref 70–99)
Potassium: 4 mEq/L (ref 3.5–5.1)
Sodium: 139 mEq/L (ref 135–145)
Total Bilirubin: 1 mg/dL (ref 0.2–1.2)
Total Protein: 6 g/dL (ref 6.0–8.3)

## 2018-06-04 LAB — CBC
HCT: 30 % — ABNORMAL LOW (ref 39.0–52.0)
Hemoglobin: 10.3 g/dL — ABNORMAL LOW (ref 13.0–17.0)
MCHC: 34.2 g/dL (ref 30.0–36.0)
MCV: 108.3 fl — AB (ref 78.0–100.0)
Platelets: 156 10*3/uL (ref 150.0–400.0)
RBC: 2.77 Mil/uL — ABNORMAL LOW (ref 4.22–5.81)
RDW: 13.8 % (ref 11.5–15.5)
WBC: 2.4 10*3/uL — ABNORMAL LOW (ref 4.0–10.5)

## 2018-06-04 LAB — IBC + FERRITIN
Ferritin: 688.3 ng/mL — ABNORMAL HIGH (ref 22.0–322.0)
Iron: 211 ug/dL — ABNORMAL HIGH (ref 42–165)
SATURATION RATIOS: 92.5 % — AB (ref 20.0–50.0)
Transferrin: 163 mg/dL — ABNORMAL LOW (ref 212.0–360.0)

## 2018-06-04 LAB — MICROALBUMIN / CREATININE URINE RATIO
Creatinine,U: 147.2 mg/dL
Microalb Creat Ratio: 2.1 mg/g (ref 0.0–30.0)
Microalb, Ur: 3 mg/dL — ABNORMAL HIGH (ref 0.0–1.9)

## 2018-06-04 LAB — LIPID PANEL
Cholesterol: 162 mg/dL (ref 0–200)
HDL: 112.6 mg/dL (ref >39.00)
Total CHOL/HDL Ratio: 1
Triglycerides: 1109 mg/dL — ABNORMAL HIGH (ref 0.0–149.0)

## 2018-06-04 LAB — PSA, MEDICARE: PSA: 0.22 ng/ml (ref 0.10–4.00)

## 2018-06-04 LAB — TSH: TSH: 1.18 u[IU]/mL (ref 0.35–4.50)

## 2018-06-04 LAB — VITAMIN B12: Vitamin B-12: 1325 pg/mL — ABNORMAL HIGH (ref 211–911)

## 2018-06-04 MED ORDER — CYANOCOBALAMIN 1000 MCG/ML IJ SOLN: INTRAMUSCULAR | 2 refills | Status: DC

## 2018-06-04 MED ORDER — CARVEDILOL 3.125 MG PO TABS: 3.1250 mg | ORAL_TABLET | Freq: Two times a day (BID) | ORAL | 3 refills | Status: DC

## 2018-06-04 MED ORDER — EZETIMIBE-SIMVASTATIN 10-80 MG PO TABS: 1.0000 | ORAL_TABLET | Freq: Every day | ORAL | 3 refills | Status: DC

## 2018-06-04 MED ORDER — PIOGLITAZONE HCL 45 MG PO TABS: 45.0000 mg | ORAL_TABLET | Freq: Every day | ORAL | 3 refills | Status: DC

## 2018-06-04 MED ORDER — OMEPRAZOLE 20 MG PO CPDR: 20.0000 mg | DELAYED_RELEASE_CAPSULE | Freq: Every day | ORAL | 3 refills | Status: DC

## 2018-06-04 MED ORDER — HYDROCHLOROTHIAZIDE 25 MG PO TABS: 25.0000 mg | ORAL_TABLET | Freq: Every day | ORAL | 3 refills | Status: DC

## 2018-06-04 MED ORDER — CALCITRIOL 0.25 MCG PO CAPS: 0.2500 ug | ORAL_CAPSULE | Freq: Every day | ORAL | 3 refills | Status: DC

## 2018-06-04 NOTE — Patient Instructions (Signed)
It was very nice to see you today!  I will refill your medications today.  We will check blood work and a urine sample today.  Come back in 6 months, or sooner as needed.  Depending on the results of your blood work, we will send in a replacement for the onglyza.  Take care, Dr Jerline Pain   Preventive Care 68 Years and Older, Male Preventive care refers to lifestyle choices and visits with your health care provider that can promote health and wellness. What does preventive care include?   A yearly physical exam. This is also called an annual well check.  Dental exams once or twice a year.  Routine eye exams. Ask your health care provider how often you should have your eyes checked.  Personal lifestyle choices, including: ? Daily care of your teeth and gums. ? Regular physical activity. ? Eating a healthy diet. ? Avoiding tobacco and drug use. ? Limiting alcohol use. ? Practicing safe sex. ? Taking low doses of aspirin every day. ? Taking vitamin and mineral supplements as recommended by your health care provider. What happens during an annual well check? The services and screenings done by your health care provider during your annual well check will depend on your age, overall health, lifestyle risk factors, and family history of disease. Counseling Your health care provider may ask you questions about your:  Alcohol use.  Tobacco use.  Drug use.  Emotional well-being.  Home and relationship well-being.  Sexual activity.  Eating habits.  History of falls.  Memory and ability to understand (cognition).  Work and work Statistician. Screening You may have the following tests or measurements:  Height, weight, and BMI.  Blood pressure.  Lipid and cholesterol levels. These may be checked every 5 years, or more frequently if you are over 21 years old.  Skin check.  Lung cancer screening. You may have this screening every year starting at age 42 if you have a  30-pack-year history of smoking and currently smoke or have quit within the past 15 years.  Colorectal cancer screening. All adults should have this screening starting at age 73 and continuing until age 83. You will have tests every 1-10 years, depending on your results and the type of screening test. People at increased risk should start screening at an earlier age. Screening tests may include: ? Guaiac-based fecal occult blood testing. ? Fecal immunochemical test (FIT). ? Stool DNA test. ? Virtual colonoscopy. ? Sigmoidoscopy. During this test, a flexible tube with a tiny camera (sigmoidoscope) is used to examine your rectum and lower colon. The sigmoidoscope is inserted through your anus into your rectum and lower colon. ? Colonoscopy. During this test, a long, thin, flexible tube with a tiny camera (colonoscope) is used to examine your entire colon and rectum.  Prostate cancer screening. Recommendations will vary depending on your family history and other risks.  Hepatitis C blood test.  Hepatitis B blood test.  Sexually transmitted disease (STD) testing.  Diabetes screening. This is done by checking your blood sugar (glucose) after you have not eaten for a while (fasting). You may have this done every 1-3 years.  Abdominal aortic aneurysm (AAA) screening. You may need this if you are a current or former smoker.  Osteoporosis. You may be screened starting at age 80 if you are at high risk. Talk with your health care provider about your test results, treatment options, and if necessary, the need for more tests. Vaccines Your health care provider may  recommend certain vaccines, such as:  Influenza vaccine. This is recommended every year.  Tetanus, diphtheria, and acellular pertussis (Tdap, Td) vaccine. You may need a Td booster every 10 years.  Varicella vaccine. You may need this if you have not been vaccinated.  Zoster vaccine. You may need this after age 74.  Measles, mumps,  and rubella (MMR) vaccine. You may need at least one dose of MMR if you were born in 1957 or later. You may also need a second dose.  Pneumococcal 13-valent conjugate (PCV13) vaccine. One dose is recommended after age 50.  Pneumococcal polysaccharide (PPSV23) vaccine. One dose is recommended after age 79.  Meningococcal vaccine. You may need this if you have certain conditions.  Hepatitis A vaccine. You may need this if you have certain conditions or if you travel or work in places where you may be exposed to hepatitis A.  Hepatitis B vaccine. You may need this if you have certain conditions or if you travel or work in places where you may be exposed to hepatitis B.  Haemophilus influenzae type b (Hib) vaccine. You may need this if you have certain risk factors. Talk to your health care provider about which screenings and vaccines you need and how often you need them. This information is not intended to replace advice given to you by your health care provider. Make sure you discuss any questions you have with your health care provider. Document Released: 04/10/2015 Document Revised: 05/04/2017 Document Reviewed: 01/13/2015 Elsevier Interactive Patient Education  2019 [employer].

## 2018-06-04 NOTE — Assessment & Plan Note (Signed)
Check lipid panel.  Continue Vytorin.

## 2018-06-04 NOTE — Assessment & Plan Note (Addendum)
Check A1c.  Continue Actos 45 mg daily and Onglyza 5 mg daily.  Depending on glycemic control, would consider reducing medications.  Insurance will no longer cover Onglyza.  Will consider switching to Januvia depending on result of A1c.  Foot exam performed today.  Check urine microalbumin.

## 2018-06-04 NOTE — Assessment & Plan Note (Signed)
At goal.  Continue Coreg 3.125 mg twice daily and HCTZ 25 mg daily.  Check CBC, C met, and TSH.

## 2018-06-04 NOTE — Assessment & Plan Note (Signed)
Check calcium and PTH.  Continue calcitriol 0.25 mcg daily.

## 2018-06-04 NOTE — Progress Notes (Signed)
Chief Complaint:  Mitchell Alvarado is a 68 y.o. male who presents today for his annual comprehensive physical exam.    Assessment/Plan:  Diabetes ([place]) Check A1c.  Continue Actos 45 mg daily and Onglyza 5 mg daily.  Depending on glycemic control, would consider reducing medications.  Insurance will no longer cover Onglyza.  Will consider switching to Januvia depending on result of A1c.  Foot exam performed today.  Check urine microalbumin.  Iron deficiency anemia Check CBC and iron panel.  Hypertension associated with diabetes ([place]) At goal.  Continue Coreg 3.125 mg twice daily and HCTZ 25 mg daily.  Check CBC, C met, and TSH.  Hyperparathyroidism, secondary ([place]) Check calcium and PTH.  Continue calcitriol 0.25 mcg daily.  Dyslipidemia associated with type 2 diabetes mellitus (HCC) Check lipid panel.  Continue Vytorin.  CKD stage 3 secondary to diabetes ([place]) Check C met.  Preventative Healthcare: Check PSA.  Foot exam performed today.  Patient Counseling(The following topics were reviewed and/or handout was given):  -Nutrition: Stressed importance of moderation in sodium/caffeine intake, saturated fat and cholesterol, caloric balance, sufficient intake of fresh fruits, vegetables, and fiber.  -Stressed the importance of regular exercise.   -Substance Abuse: Discussed cessation/primary prevention of tobacco, alcohol, or other drug use; driving or other dangerous activities under the influence; availability of treatment for abuse.   -Injury prevention: Discussed safety belts, safety helmets, smoke detector, smoking near bedding or upholstery.   -Sexuality: Discussed sexually transmitted diseases, partner selection, use of condoms, avoidance of unintended pregnancy and contraceptive alternatives.   -Dental health: Discussed importance of regular tooth brushing, flossing, and dental visits.  -Health maintenance and immunizations reviewed. Please refer to Health maintenance  section.  Return to care in 1 year for next preventative visit.     Subjective:  HPI:  He has no acute complaints today.   His stable, chronic medical conditions are outlined below:  # T2DM - On Actos 69m daily and Onglyza 530mdaily.  Tolerating both well without side effects. - ROS: No reported polyuria or polydipsia.  # Essential Hypertension - On coreg 3.12539mwice daily and HCTZ 92m63mily and tolerating well. - ROS: No reported chest pain or shortness of breath.  # Dyslipidemia - On vytorin 10-80 once daily and tolerating well. - ROS: No myalgias.  # Hyperparathyroid / hypocalcemia -Takes calcitriol 0.25 mcg daily.  # B12 deficiency -Takes 1000 mcg injection monthly.  Tolerating well.  # GERD - On omeprazole 20 mg daily and tolerating well.  % Leukopenia / Anemia - Follows with hematology  Lifestyle Diet: No specific diets.  Exercise: No specific exercises.   Depression screen PHQ 2/9 06/04/2018  Decreased Interest 0  Down, Depressed, Hopeless 0  PHQ - 2 Score 0    Health Maintenance Due  Topic Date Due  . FOOT EXAM  11/29/2017  . HEMOGLOBIN A1C  05/29/2018  . URINE MICROALBUMIN  05/30/2018     ROS: Per HPI, otherwise a complete review of systems was negative.   PMH:  The following were reviewed and entered/updated in epic: Past Medical History:  Diagnosis Date  . ANEMIA, IRON DEFICIENCY 12/23/2009  . Barrett's esophagus without dysplasia 05/2012  . DIABETES MELLITUS, TYPE II 12/30/2006  . ECZEMA 05/15/2009  . GERD 12/30/2006  . HYPERLIPIDEMIA 12/30/2006  . HYPERTENSION 12/30/2006  . HYPERTHYROIDISM 06/03/2008  . Pernicious anemia 12/30/2006  . Personal history of colonic polyps - adenoma 07/22/2004   Patient Active Problem List   Diagnosis Date Noted  .  Hyperparathyroidism, secondary ([place]) 05/27/2016  . CKD stage 3 secondary to diabetes ([place]) 05/21/2012  . Iron deficiency anemia 12/23/2009  . ECZEMA 05/15/2009  . Diabetes ([place]) 12/30/2006  .  Dyslipidemia associated with type 2 diabetes mellitus ([place]) 12/30/2006  . PERNICIOUS ANEMIA 12/30/2006  . Hypertension associated with diabetes ([place]) 12/30/2006  . GERD 12/30/2006  . Personal history of colonic polyps - adenoma 07/22/2004  . Barrett's esophagus -less than 1 cm, does not meet current criteria for this diagnosis 01/24/2002   Past Surgical History:  Procedure Laterality Date  . COLONOSCOPY  multiple  . ESOPHAGOGASTRODUODENOSCOPY  multiple  . TRANSURETHRAL RESECTION OF PROSTATE  2000    Family History  Problem Relation Age of Onset  . Heart attack Father        MI  . Depression Son   . COPD Son   . Cancer Neg Hx   . Colon cancer Neg Hx     Medications- reviewed and updated Current Outpatient Medications  Medication Sig Dispense Refill  . aspirin 81 MG tablet Take 1 tablet (81 mg total) by mouth daily. 90 tablet 3  . calcitRIOL (ROCALTROL) 0.25 MCG capsule Take 1 capsule (0.25 mcg total) by mouth daily. Take Monday Wednesday and Friday. 40 capsule 3  . carvedilol (COREG) 3.125 MG tablet Take 1 tablet (3.125 mg total) by mouth 2 (two) times daily. 180 tablet 3  . Cholecalciferol (VITAMIN D) 2000 units CAPS Take 2,000 Units by mouth daily.    . cyanocobalamin (,VITAMIN B-12,) 1000 MCG/ML injection Inject 1 Ml once monthly 1 mL 2  . ezetimibe-simvastatin (VYTORIN) 10-80 MG tablet Take 1 tablet by mouth daily. 90 tablet 3  . glucose blood (ONETOUCH VERIO) test strip 1 each by Other route daily. And lancets 1/day 100 each 3  . hydrochlorothiazide (HYDRODIURIL) 25 MG tablet Take 1 tablet (25 mg total) by mouth daily. 90 tablet 3  . latanoprost (XALATAN) 0.005 % ophthalmic solution Place 1 drop into both eyes at bedtime.    Marland Kitchen omeprazole (PRILOSEC) 20 MG capsule Take 1 capsule (20 mg total) by mouth daily. 90 capsule 3  . pioglitazone (ACTOS) 45 MG tablet Take 1 tablet (45 mg total) by mouth daily. 90 tablet 3  . saxagliptin HCl (ONGLYZA) 5 MG TABS tablet Take 1 tablet (5 mg  total) by mouth every morning. 90 tablet 3   No current facility-administered medications for this visit.     Allergies-reviewed and updated No Known Allergies  Social History   Socioeconomic History  . Marital status: Legally Separated    Spouse name: Not on file  . Number of children: Not on file  . Years of education: Not on file  . Highest education level: Not on file  Occupational History  . Occupation: Retired from school system  Social Needs  . Financial resource strain: Not on file  . Food insecurity:    Worry: Not on file    Inability: Not on file  . Transportation needs:    Medical: Not on file    Non-medical: Not on file  Tobacco Use  . Smoking status: Former Research scientist (life sciences)  . Smokeless tobacco: Never Used  Substance and Sexual Activity  . Alcohol use: Yes    Alcohol/week: 7.0 standard drinks    Types: 7 Glasses of wine per week    Comment: 5-7 glasses wine weekly  . Drug use: No  . Sexual activity: Not on file  Lifestyle  . Physical activity:    Days per week: Not  on file    Minutes per session: Not on file  . Stress: Not on file  Relationships  . Social connections:    Talks on phone: Not on file    Gets together: Not on file    Attends religious service: Not on file    Active member of club or organization: Not on file    Attends meetings of clubs or organizations: Not on file    Relationship status: Not on file  Other Topics Concern  . 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 Wt Readings from Last 3 Encounters:  06/04/18 256 lb 3.2  oz (116.2 kg)  12/28/17 261 lb 8 oz (118.6 kg)  11/28/17 257 lb 6.4 oz (116.8 kg)   Gen: NAD, resting comfortably HEENT: TMs normal bilaterally. OP clear. No thyromegaly noted.  CV: RRR with no murmurs appreciated Pulm: NWOB, CTAB with no crackles, wheezes, or rhonchi GI: Normal bowel sounds present. Soft, Nontender, Nondistended. MSK: no edema, cyanosis, or clubbing noted Skin: warm, dry Neuro: CN2-12 grossly intact. Strength 5/5 in upper and lower extremities. Reflexes symmetric and intact bilaterally.  Psych: Normal affect and thought content     Caleb M. Jerline Pain, MD 06/04/2018 11:44 AM

## 2018-06-04 NOTE — Assessment & Plan Note (Signed)
Check C met. 

## 2018-06-04 NOTE — Assessment & Plan Note (Signed)
Check CBC and iron panel.

## 2018-06-05 LAB — LDL CHOLESTEROL, DIRECT: Direct LDL: 30 mg/dL

## 2018-06-07 NOTE — Progress Notes (Signed)
Please inform patient of the following:  His triglycerides are very high - please ask him to come back for fasting lipid panel.   Everything else is normal/stable.  His kidney function is stable. Iron levels and b12 are good.Thyroid is normal.   Would like to see him back in 6 months to recheck his A1c.  Algis Greenhouse. Jerline Pain, MD 06/07/2018 9:12 PM

## 2018-06-08 ENCOUNTER — Other Ambulatory Visit: Payer: Self-pay

## 2018-06-08 DIAGNOSIS — E1169 Type 2 diabetes mellitus with other specified complication: Secondary | ICD-10-CM

## 2018-06-08 DIAGNOSIS — E785 Hyperlipidemia, unspecified: Principal | ICD-10-CM

## 2018-06-12 ENCOUNTER — Other Ambulatory Visit: Payer: Self-pay

## 2018-06-12 ENCOUNTER — Other Ambulatory Visit (INDEPENDENT_AMBULATORY_CARE_PROVIDER_SITE_OTHER): Payer: Medicare Other

## 2018-06-12 DIAGNOSIS — E785 Hyperlipidemia, unspecified: Secondary | ICD-10-CM | POA: Diagnosis not present

## 2018-06-12 DIAGNOSIS — E1169 Type 2 diabetes mellitus with other specified complication: Secondary | ICD-10-CM

## 2018-06-12 LAB — LIPID PANEL
Cholesterol: 171 mg/dL (ref 0–200)
HDL: 97.9 mg/dL (ref >39.00)
Total CHOL/HDL Ratio: 2
Triglycerides: 656 mg/dL — ABNORMAL HIGH (ref 0.0–149.0)

## 2018-06-12 LAB — LDL CHOLESTEROL, DIRECT: Direct LDL: 33 mg/dL

## 2018-06-13 ENCOUNTER — Telehealth: Payer: Self-pay | Admitting: Family Medicine

## 2018-06-13 ENCOUNTER — Other Ambulatory Visit: Payer: Self-pay | Admitting: Family Medicine

## 2018-06-13 MED ORDER — FENOFIBRATE 145 MG PO TABS: 145.0000 mg | ORAL_TABLET | Freq: Every day | ORAL | 3 refills | Status: DC

## 2018-06-13 NOTE — Progress Notes (Signed)
Please inform patient of the following:  Triglyercides still significantly elevated. Recommend starting a fibrate to lower numbers. Will send in new rx. Would like for him to let us know if he has any side effects (most commonly muscle aches). Would like to recheck in 3-6 months.   Mitchell Alvarado. Jerline Pain, MD 06/13/2018 7:48 AM

## 2018-06-13 NOTE — Telephone Encounter (Signed)
See note  Copied from [place] [phone]. Topic: General - Inquiry >> Jun 13, 2018 10:17 AM Virl Axe D wrote: Reason for CRM: Silva Bandy with [facility] stated they need to know the source of the frozen specimen. Please advise. [place]

## 2018-06-13 NOTE — Telephone Encounter (Signed)
Do you know anything about this? 

## 2018-06-15 LAB — TIQ-NTM

## 2018-06-15 LAB — PARATHYROID HORMONE, INTACT (NO CA)

## 2018-06-18 LAB — PARATHYROID HORMONE, INTACT (NO CA): PTH: 56 pg/mL (ref 14–64)

## 2018-06-27 ENCOUNTER — Telehealth: Payer: Self-pay | Admitting: *Deleted

## 2018-06-27 NOTE — Telephone Encounter (Signed)
Contacted patient per [facility] current rescheduling guidelines.  Dr.Kale would like to reschedule appt for 4/2 and see in 2 months.  Informed patient that scheduling will contact them. Patient verbalized understanding.  Schedule msg sent.

## 2018-06-28 ENCOUNTER — Telehealth: Payer: Self-pay | Admitting: Hematology

## 2018-06-28 ENCOUNTER — Ambulatory Visit: Payer: Medicare Other | Admitting: Hematology

## 2018-06-28 ENCOUNTER — Other Ambulatory Visit: Payer: Medicare Other

## 2018-06-28 NOTE — Telephone Encounter (Signed)
R/s appt per 4/1 sch message - unable tot reach patient . Left message and sent reminder letter in the mail.

## 2018-08-27 NOTE — Progress Notes (Signed)
HEMATOLOGY/ONCOLOGY CLINIC NOTE  Date of Service: 08/28/2018  Patient Care Team: [name], MD as PCP - General (Family Medicine) [name], MD as Attending Physician (Ophthalmology) [name], MD as Attending Physician (Urology) [name], MD as Consulting Physician (Nephrology)  CHIEF COMPLAINTS/PURPOSE OF CONSULTATION:  Anemia  HISTORY OF PRESENTING ILLNESS:   Mitchell Alvarado is a wonderful 68 y.o. male who has been referred to [place] by Dr Renato Shin for evaluation and management of anemia. The pt reports that he is doing well overall.   The pt reports that he has been taking monthly Vitamin B12 injections for the last 4-5 years, and his deficiency was first realized through routine blood work. He was taken off Metformin at the time. He has taken Omeprazole for 20 years for his Barrett's esophagus. He denies any food intolerances or thyroid problems. He notes that he has had some fatigue which he attributes to getting older.   He has seen a nephrologist several years ago and was discharged after replacing his Vitamin D satisfactorily and he denies any liver problems as well.   Most recent lab results (05/29/17) of CBC  is as follows: all values are WNL except for WBC at 3.5k, RBC at 3.04, HGB at 11.3, HCT at 32.9, MCV at 108.0.  Review of CBC w/diff over the last two years reveals stability in the above abnormalities.   On review of systems, pt reports some fatigue, stable weight, and denies problems of infection, bleeding, back pains, pain along the spine, abdominal pains, changes in bowel habits, leg selling, urinary discomfort, and any other symptoms.   On PMHx the pt reports neuropathy, prostate surgery in 2000, denies Afib. On Social Hx the pt reports 10-14 glasses of wine each week  and denies any concern for excessive consumption. He denies smoking and smoked cigarettes for one year a long time ago  On Family Hx the pt reports paternal heart  attack.  Interval History:   Mitchell Alvarado returns today for management and evaluation of his macrocytic anemia. The patient's last visit with [place] was on 12/28/17. The pt reports that he is doing well overall.  The pt reports that he has not had any new concerns in the interim. He continues follow up with his PCP for management of his DM and HTN. He denies any changes in energy levels. He has continued taking Vitamin B12 replacement. The pt denies any concerns for infections in the interim.   The pt notes that he has been consuming less alcohol, now 2-3 times a week of 2-3 glasses of wine per time.   Lab results today (08/28/18) of CBC w/diff and CMP is as follows: all values are WNL except for WBC at 3.7k, RBC at 2.84, HGB at 10.0, HCT at 30.2, MCV at 106.3, MCH at 35.2, Glucose at 132, Creatinine at 1.81, Alk Phos at 28, GFR at 38. 08/28/18 LDH at 147 08/28/18 Erythropoietin is 12.1 08/28/18 Vitamin B12 is 445  On review of systems, pt reports stable energy levels, eating well,  and denies changes in energy levels, concerns for infections, and any other symptoms.   MEDICAL HISTORY:  Past Medical History:  Diagnosis Date  . ANEMIA, IRON DEFICIENCY 12/23/2009  . Barrett's esophagus without dysplasia 05/2012  . DIABETES MELLITUS, TYPE II 12/30/2006  . ECZEMA 05/15/2009  . GERD 12/30/2006  . HYPERLIPIDEMIA 12/30/2006  . HYPERTENSION 12/30/2006  . HYPERTHYROIDISM 06/03/2008  . Pernicious anemia 12/30/2006  . Personal history of  colonic polyps - adenoma 07/22/2004    SURGICAL HISTORY: Past Surgical History:  Procedure Laterality Date  . COLONOSCOPY  multiple  . ESOPHAGOGASTRODUODENOSCOPY  multiple  . TRANSURETHRAL RESECTION OF PROSTATE  2000    SOCIAL HISTORY: Social History   Socioeconomic History  . Marital status: Legally Separated    Spouse name: Not on file  . Number of children: Not on file  . Years of education: Not on file  . Highest education level: Not on file  Occupational  History  . Occupation: Retired from school system  Social Needs  . Financial resource strain: Not on file  . Food insecurity:    Worry: Not on file    Inability: Not on file  . Transportation needs:    Medical: Not on file    Non-medical: Not on file  Tobacco Use  . Smoking status: Former Research scientist (life sciences)  . Smokeless tobacco: Never Used  Substance and Sexual Activity  . Alcohol use: Yes    Alcohol/week: 7.0 standard drinks    Types: 7 Glasses of wine per week    Comment: 5-7 glasses wine weekly  . Drug use: No  . Sexual activity: Not on file  Lifestyle  . Physical activity:    Days per week: Not on file    Minutes per session: Not on file  . Stress: Not on file  Relationships  . Social connections:    Talks on phone: Not on file    Gets together: Not on file    Attends religious service: Not on file    Active member of club or organization: Not on file    Attends meetings of clubs or organizations: Not on file    Relationship status: Not on file  . Intimate partner violence:    Fear of current or ex partner: Not on file    Emotionally abused: Not on file    Physically abused: Not on file    Forced sexual activity: Not on file  Other Topics Concern  . 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 Heart attack Father        MI  . Depression Son   . COPD Son   . Cancer Neg Hx   . Colon cancer Neg Hx     ALLERGIES:  has No Known Allergies.  MEDICATIONS:  Current Outpatient Medications  Medication Sig Dispense Refill  . aspirin 81 MG tablet Take 1 tablet (81 mg total) by mouth daily. 90 tablet 3  . calcitRIOL (ROCALTROL) 0.25 MCG capsule Take 1 capsule (0.25 mcg total) by mouth daily. Take Monday Wednesday and Friday. 40 capsule 3  . carvedilol (COREG) 3.125 MG  tablet Take 1 tablet (3.125 mg total) by mouth 2 (two) times daily. 180 tablet 3  . Cholecalciferol (VITAMIN D) 2000 units CAPS Take 2,000 Units by mouth daily.    . cyanocobalamin (,VITAMIN B-12,) 1000 MCG/ML injection Inject 1 Ml once monthly 1 mL 2  . ezetimibe-simvastatin (VYTORIN) 10-80 MG tablet Take 1 tablet by mouth daily. 90 tablet 3  . fenofibrate (TRICOR) 145 MG tablet Take 1 tablet (145 mg total) by mouth daily. 90 tablet 3  . glucose blood (ONETOUCH VERIO) test strip 1 each by Other route daily. And  lancets 1/day 100 each 3  . hydrochlorothiazide (HYDRODIURIL) 25 MG tablet Take 1 tablet (25 mg total) by mouth daily. 90 tablet 3  . latanoprost (XALATAN) 0.005 % ophthalmic solution Place 1 drop into both eyes at bedtime.    Marland Kitchen omeprazole (PRILOSEC) 20 MG capsule Take 1 capsule (20 mg total) by mouth daily. 90 capsule 3  . pioglitazone (ACTOS) 45 MG tablet Take 1 tablet (45 mg total) by mouth daily. 90 tablet 3  . saxagliptin HCl (ONGLYZA) 5 MG TABS tablet Take 1 tablet (5 mg total) by mouth every morning. 90 tablet 3   No current facility-administered medications for this visit.     REVIEW OF SYSTEMS:    A 10+ POINT REVIEW OF SYSTEMS WAS OBTAINED including neurology, dermatology, psychiatry, cardiac, respiratory, lymph, extremities, GI, GU, Musculoskeletal, constitutional, breasts, reproductive, HEENT.  All pertinent positives are noted in the HPI.  All others are negative.   PHYSICAL EXAMINATION:  . Vitals:   08/28/18 1409  BP: (!) 146/68  Pulse: 81  Resp: 18  Temp: 98.5 F (36.9 C)  SpO2: 100%   Filed Weights   08/28/18 1409  Weight: 264 lb (119.7 kg)   .Body mass index is 33.9 kg/m.  GENERAL:alert, in no acute distress and comfortable SKIN: no acute rashes, no significant lesions EYES: conjunctiva are pink and non-injected, sclera anicteric OROPHARYNX: MMM, no exudates, no oropharyngeal erythema or ulceration NECK: supple, no JVD LYMPH:  no palpable  lymphadenopathy in the cervical, axillary or inguinal regions LUNGS: clear to auscultation b/l with normal respiratory effort HEART: regular rate & rhythm ABDOMEN:  normoactive bowel sounds , non tender, not distended. No palpable hepatosplenomegaly.  Extremity: no pedal edema PSYCH: alert & oriented x 3 with fluent speech NEURO: no focal motor/sensory deficits   LABORATORY DATA:  I have reviewed the data as listed  . CBC Latest Ref Rng & Units 08/28/2018 06/04/2018 12/28/2017  WBC 4.0 - 10.5 K/uL 3.7(L) 2.4(L) 2.8(L)  Hemoglobin 13.0 - 17.0 g/dL 10.0(L) 10.3(L) 10.6(L)  Hematocrit 39.0 - 52.0 % 30.2(L) 30.0(L) 31.5(L)  Platelets 150 - 400 K/uL 197 156.0 174   . CBC    Component Value Date/Time   WBC 3.7 (L) 08/28/2018 1305   RBC 2.84 (L) 08/28/2018 1305   HGB 10.0 (L) 08/28/2018 1305   HCT 30.2 (L) 08/28/2018 1305   HCT 31.1 (L) 09/07/2017 1405   PLT 197 08/28/2018 1305   MCV 106.3 (H) 08/28/2018 1305   MCH 35.2 (H) 08/28/2018 1305   MCHC 33.1 08/28/2018 1305   RDW 12.8 08/28/2018 1305   LYMPHSABS 1.4 08/28/2018 1305   MONOABS 0.4 08/28/2018 1305   EOSABS 0.1 08/28/2018 1305   BASOSABS 0.0 08/28/2018 1305     . CMP Latest Ref Rng & Units 08/28/2018 06/04/2018 09/07/2017  Glucose 70 - 99 mg/dL 132(H) 103(H) 104  BUN 8 - 23 mg/dL 19 25(H) 28(H)  Creatinine 0.61 - 1.24 mg/dL 1.81(H) 1.58(H) 1.53(H)  Sodium 135 - 145 mmol/L 139 139 137  Potassium 3.5 - 5.1 mmol/L 4.4 4.0 4.5  Chloride 98 - 111 mmol/L 104 103 99  CO2 22 - 32 mmol/L _0 Calcium 8.9 - 10.3 mg/dL 9.5 8.9 9.6  Total Protein 6.5 - 8.1 g/dL 6.8 6.0 7.2  Total Bilirubin 0.3 - 1.2 mg/dL 0.4 1.0 1.3(H)  Alkaline Phos 38 - 126 U/L 28(L) 57 52  AST 15 - 41 U/L 23 39(H) 37(H)  ALT 0 - 44 U/L 15 33 22  Component     Latest Ref Rng & Units 09/07/2017  IgG (Immunoglobin G), Serum     700 - 1,600 mg/dL 823  IgA     61 - 437 mg/dL 248  IgM (Immunoglobulin M), Srm     20 - 172 mg/dL 143  Total Protein ELP      6.0 - 8.5 g/dL 6.7  Albumin SerPl Elph-Mcnc     2.9 - 4.4 g/dL 4.0  Alpha 1     0.0 - 0.4 g/dL 0.2  Alpha2 Glob SerPl Elph-Mcnc     0.4 - 1.0 g/dL 0.8  B-Globulin SerPl Elph-Mcnc     0.7 - 1.3 g/dL 1.1  Gamma Glob SerPl Elph-Mcnc     0.4 - 1.8 g/dL 0.6  M Protein SerPl Elph-Mcnc     Not Observed g/dL Not Observed  Globulin, Total     2.2 - 3.9 g/dL 2.7  Albumin/Glob SerPl     0.7 - 1.7 1.5  IFE 1      Comment  Please Note (HCV):      Comment  Folate, Hemolysate     Not Estab. ng/mL 204.0  HCT     37.5 - 51.0 % 31.1 (L)  Folate, RBC     >498 ng/mL 656  Vitamin B12     180 - 914 pg/mL 6,847 (H)  TSH     0.320 - 4.118 uIU/mL 1.016  Ferritin     22 - 316 ng/mL 867 (H)  Copper     72 - 166 ug/dL 97  Parietal Cell Antibody-IgG     0.0 - 20.0 Units 10.9  Intrinsic Factor     0.0 - 1.1 AU/mL 11.8 (H)    RADIOGRAPHIC STUDIES: I have personally reviewed the radiological images as listed and agreed with the findings in the report. No results found.  ASSESSMENT & PLAN:   68 y.o. male with  1. Macrocytic Anemia Review of patient's CBCs w/diff over the last two years reveals stability on the above abnormalities  Patient appears to have pernicious anemia with anti intrinsic factor antibodies. Currently no B12 or folate deficiency deficiency.but could have other B vit deficiencies. No monoclonal paraproteinemia. Concern for heavy ETOH use Has some CKD that could be contributing to the anemia as well. No known liver disease No overt bleeding noted.  2. B12 deficiency -multifactorial -- pernicious anemia + previous metformin use + current PPI use.  PLAN -Discussed pt labwork today, 08/28/18; WBC have improved to 3.7k with ANC normalized to 1800, HGB stable at 10.0 with stable MCV at 106.3. LDH normal at 147. Creatinine a little higher at 1.81. -08/28/18 Erythropoietin and Vitamin B12 are reviewed -Discussed that with a normal RDW, nutritional deficiency is not suspected.  Discussed that I suspect either alcohol vs medication effect. Last TSH was normal at 1.18 on 06/04/18 -Recommend continuing to minimize alcohol consumption -If hemoglobin continues to drop without alternative explanations, would then consider a BM Bx -Continue non-PO Vitamin B12 replacement as per PCP -Reasonable to empirically start taking Vitamin B complex  -Replace Vitamin D as 05/29/17 labs were low at 17.96 - per PCP -Discussed the 12/25/17 US Abdomen which revealed Probable fatty infiltration of liver. Tiny LEFT renal cyst. Otherwise normal exam.  -Recommend PCP consider Elastography of liver to r/o liver cirrhosis -Will see the pt back in 6 months  3. . Patient Active Problem List   Diagnosis Date Noted  . Hyperparathyroidism, secondary ([place]) 05/27/2016  . CKD stage 3 secondary  to diabetes ([place]) 05/21/2012  . Iron deficiency anemia 12/23/2009  . ECZEMA 05/15/2009  . Diabetes ([place]) 12/30/2006  . Dyslipidemia associated with type 2 diabetes mellitus ([place]) 12/30/2006  . PERNICIOUS ANEMIA 12/30/2006  . Hypertension associated with diabetes ([place]) 12/30/2006  . GERD 12/30/2006  . Personal history of colonic polyps - adenoma 07/22/2004  . Barrett's esophagus -less than 1 cm, does not meet current criteria for this diagnosis 01/24/2002  -Mx per PCP   RTC with Dr Irene Limbo with labs in 6 months   All of the patients questions were answered with apparent satisfaction. The patient knows to call the clinic with any problems, questions or concerns.  The total time spent in the appt was 20 minutes and more than 50% was on counseling and direct patient cares.     Sullivan Lone MD MS AAHIVMS [facility] Hematology/Oncology Physician [facility]  (Office):       [phone] (Work cell):  [phone] (Fax):           [phone]  08/28/2018 2:34 PM  I, Baldwin [place], am acting as a scribe for Dr. Sullivan Lone.   .I have reviewed the above documentation for accuracy and  completeness, and I agree with the above. Brunetta Genera MD

## 2018-08-28 ENCOUNTER — Telehealth: Payer: Self-pay | Admitting: Hematology

## 2018-08-28 ENCOUNTER — Inpatient Hospital Stay: Payer: Medicare Other | Attending: Hematology

## 2018-08-28 ENCOUNTER — Inpatient Hospital Stay (HOSPITAL_BASED_OUTPATIENT_CLINIC_OR_DEPARTMENT_OTHER): Payer: Medicare Other | Admitting: Hematology

## 2018-08-28 ENCOUNTER — Other Ambulatory Visit: Payer: Self-pay

## 2018-08-28 VITALS — BP 146/68 | HR 81 | Temp 98.5°F | Resp 18 | Ht 74.0 in | Wt 264.0 lb

## 2018-08-28 DIAGNOSIS — I10 Essential (primary) hypertension: Secondary | ICD-10-CM | POA: Diagnosis not present

## 2018-08-28 DIAGNOSIS — D510 Vitamin B12 deficiency anemia due to intrinsic factor deficiency: Secondary | ICD-10-CM

## 2018-08-28 DIAGNOSIS — D539 Nutritional anemia, unspecified: Secondary | ICD-10-CM

## 2018-08-28 DIAGNOSIS — E119 Type 2 diabetes mellitus without complications: Secondary | ICD-10-CM | POA: Insufficient documentation

## 2018-08-28 DIAGNOSIS — Z87891 Personal history of nicotine dependence: Secondary | ICD-10-CM | POA: Insufficient documentation

## 2018-08-28 DIAGNOSIS — E538 Deficiency of other specified B group vitamins: Secondary | ICD-10-CM | POA: Diagnosis not present

## 2018-08-28 DIAGNOSIS — D72819 Decreased white blood cell count, unspecified: Secondary | ICD-10-CM

## 2018-08-28 LAB — CMP (CANCER CENTER ONLY)
ALT: 15 U/L (ref 0–44)
AST: 23 U/L (ref 15–41)
Albumin: 3.6 g/dL (ref 3.5–5.0)
Alkaline Phosphatase: 28 U/L — ABNORMAL LOW (ref 38–126)
Anion gap: 8 (ref 5–15)
BUN: 19 mg/dL (ref 8–23)
CO2: 27 mmol/L (ref 22–32)
Calcium: 9.5 mg/dL (ref 8.9–10.3)
Chloride: 104 mmol/L (ref 98–111)
Creatinine: 1.81 mg/dL — ABNORMAL HIGH (ref 0.61–1.24)
GFR, Est AFR Am: 44 mL/min — ABNORMAL LOW (ref >60)
GFR, Estimated: 38 mL/min — ABNORMAL LOW (ref >60)
Glucose, Bld: 132 mg/dL — ABNORMAL HIGH (ref 70–99)
Potassium: 4.4 mmol/L (ref 3.5–5.1)
Sodium: 139 mmol/L (ref 135–145)
Total Bilirubin: 0.4 mg/dL (ref 0.3–1.2)
Total Protein: 6.8 g/dL (ref 6.5–8.1)

## 2018-08-28 LAB — CBC WITH DIFFERENTIAL/PLATELET
Abs Immature Granulocytes: 0.01 10*3/uL (ref 0.00–0.07)
Basophils Absolute: 0 10*3/uL (ref 0.0–0.1)
Basophils Relative: 1 %
Eosinophils Absolute: 0.1 10*3/uL (ref 0.0–0.5)
Eosinophils Relative: 3 %
HCT: 30.2 % — ABNORMAL LOW (ref 39.0–52.0)
Hemoglobin: 10 g/dL — ABNORMAL LOW (ref 13.0–17.0)
Immature Granulocytes: 0 %
Lymphocytes Relative: 38 %
Lymphs Abs: 1.4 10*3/uL (ref 0.7–4.0)
MCH: 35.2 pg — ABNORMAL HIGH (ref 26.0–34.0)
MCHC: 33.1 g/dL (ref 30.0–36.0)
MCV: 106.3 fL — ABNORMAL HIGH (ref 80.0–100.0)
Monocytes Absolute: 0.4 10*3/uL (ref 0.1–1.0)
Monocytes Relative: 10 %
Neutro Abs: 1.8 10*3/uL (ref 1.7–7.7)
Neutrophils Relative %: 48 %
Platelets: 197 10*3/uL (ref 150–400)
RBC: 2.84 MIL/uL — ABNORMAL LOW (ref 4.22–5.81)
RDW: 12.8 % (ref 11.5–15.5)
WBC: 3.7 10*3/uL — ABNORMAL LOW (ref 4.0–10.5)
nRBC: 0 % (ref 0.0–0.2)

## 2018-08-28 LAB — VITAMIN B12: Vitamin B-12: 445 pg/mL (ref 180–914)

## 2018-08-28 LAB — LACTATE DEHYDROGENASE: LDH: 147 U/L (ref 98–192)

## 2018-08-28 NOTE — Telephone Encounter (Signed)
Scheduled appt per 6/2 los.  A calendar will be mailed out 

## 2018-08-29 LAB — ERYTHROPOIETIN: Erythropoietin: 12.1 m[IU]/mL (ref 2.6–18.5)

## 2018-09-25 ENCOUNTER — Other Ambulatory Visit: Payer: Self-pay

## 2018-09-25 MED ORDER — CYANOCOBALAMIN 1000 MCG/ML IJ SOLN: INTRAMUSCULAR | 2 refills | Status: DC

## 2018-11-16 ENCOUNTER — Other Ambulatory Visit: Payer: Self-pay | Admitting: Family Medicine

## 2018-12-04 ENCOUNTER — Ambulatory Visit: Payer: Medicare Other | Admitting: Family Medicine

## 2018-12-06 ENCOUNTER — Other Ambulatory Visit: Payer: Self-pay

## 2018-12-06 ENCOUNTER — Encounter: Payer: Self-pay | Admitting: Family Medicine

## 2018-12-06 ENCOUNTER — Ambulatory Visit (INDEPENDENT_AMBULATORY_CARE_PROVIDER_SITE_OTHER): Payer: Medicare Other | Admitting: Family Medicine

## 2018-12-06 ENCOUNTER — Ambulatory Visit: Payer: Medicare Other

## 2018-12-06 VITALS — BP 134/78 | Temp 98.0°F | Ht 74.0 in | Wt 263.4 lb

## 2018-12-06 VITALS — BP 134/78 | HR 72 | Temp 98.0°F | Ht 74.0 in | Wt 263.4 lb

## 2018-12-06 DIAGNOSIS — Z23 Encounter for immunization: Secondary | ICD-10-CM

## 2018-12-06 DIAGNOSIS — E1159 Type 2 diabetes mellitus with other circulatory complications: Secondary | ICD-10-CM | POA: Diagnosis not present

## 2018-12-06 DIAGNOSIS — Z6833 Body mass index (BMI) 33.0-33.9, adult: Secondary | ICD-10-CM

## 2018-12-06 DIAGNOSIS — E785 Hyperlipidemia, unspecified: Secondary | ICD-10-CM

## 2018-12-06 DIAGNOSIS — E1122 Type 2 diabetes mellitus with diabetic chronic kidney disease: Secondary | ICD-10-CM

## 2018-12-06 DIAGNOSIS — E1169 Type 2 diabetes mellitus with other specified complication: Secondary | ICD-10-CM

## 2018-12-06 DIAGNOSIS — E669 Obesity, unspecified: Secondary | ICD-10-CM

## 2018-12-06 DIAGNOSIS — Z0000 Encounter for general adult medical examination without abnormal findings: Secondary | ICD-10-CM

## 2018-12-06 DIAGNOSIS — N183 Chronic kidney disease, stage 3 unspecified: Secondary | ICD-10-CM

## 2018-12-06 DIAGNOSIS — I10 Essential (primary) hypertension: Secondary | ICD-10-CM

## 2018-12-06 NOTE — Patient Instructions (Signed)
It was very nice to see you today!  Keep up the good work! No changes today.  Come back to see me in 6 months for your annual physical, or sooner if needed.   Take care, Dr Jerline Pain  Please try these tips to maintain a healthy lifestyle:   Eat at least 3 REAL meals and 1-2 snacks per day.  Aim for no more than 5 hours between eating.  If you eat breakfast, please do so within one hour of getting up.    Obtain twice as many fruits/vegetables as protein or carbohydrate foods for both lunch and dinner. (Half of each meal should be fruits/vegetables, one quarter protein, and one quarter starchy carbs)   Cut down on sweet beverages. This includes juice, soda, and sweet tea.    Exercise at least 150 minutes every week.

## 2018-12-06 NOTE — Assessment & Plan Note (Signed)
Stable.  Continue Vytorin and fenofibrate.  Check lipid panel with next blood draw.

## 2018-12-06 NOTE — Progress Notes (Signed)
Subjective:   Mitchell Alvarado is a 68 y.o. male who presents for Medicare Annual/Subsequent preventive examination.  Review of Systems:   Cardiac Risk Factors include: diabetes mellitus;advanced age (>15men, >80 women);male gender;hypertension;dyslipidemia     Objective:    Vitals: BP 134/78   Temp 98 F (36.7 C)   Ht 6\' 2"  (1.88 m)   Wt 263 lb 7.2 oz (119.5 kg)   BMI 33.82 kg/m   Body mass index is 33.82 kg/m.  Advanced Directives 12/06/2018 05/29/2017 11/29/2016 11/24/2015 07/13/2015 05/26/2015 05/17/2015  Does Patient Have a Medical Advance Directive? Yes No Yes No;Yes No No No  Type of Advance Directive Living will - [place];Living will [place] - - -  Does patient want to make changes to medical advance directive? No - Patient declined - - - - - -  Copy of Press photographer in Chart? - - Yes No - copy requested - - -    Tobacco Social History   Tobacco Use  Smoking Status Former Smoker  Smokeless Tobacco Never Used     Counseling given: Not Answered   Clinical Intake:  Pre-visit preparation completed: Yes  Pain : No/denies pain     Diabetes: Yes CBG done?: No Did pt. bring in CBG monitor from home?: No     Interpreter Needed?: No  Information entered by :: Denman George LPN  Past Medical History:  Diagnosis Date  . ANEMIA, IRON DEFICIENCY 12/23/2009  . Barrett's esophagus without dysplasia 05/2012  . DIABETES MELLITUS, TYPE II 12/30/2006  . ECZEMA 05/15/2009  . GERD 12/30/2006  . HYPERLIPIDEMIA 12/30/2006  . HYPERTENSION 12/30/2006  . HYPERTHYROIDISM 06/03/2008  . Pernicious anemia 12/30/2006  . Personal history of colonic polyps - adenoma 07/22/2004   Past Surgical History:  Procedure Laterality Date  . COLONOSCOPY  multiple  . ESOPHAGOGASTRODUODENOSCOPY  multiple  . TRANSURETHRAL RESECTION OF PROSTATE  2000   Family History  Problem Relation Age of Onset  . Heart attack Father        MI  .  Depression Son   . COPD Son   . Cancer Neg Hx   . Colon cancer Neg Hx    Social History   Socioeconomic History  . Marital status: Legally Separated    Spouse name: Not on file  . Number of children: Not on file  . Years of education: Not on file  . Highest education level: Not on file  Occupational History  . Occupation: Retired from school system  Social Needs  . Financial resource strain: Not on file  . Food insecurity    Worry: Not on file    Inability: Not on file  . Transportation needs    Medical: Not on file    Non-medical: Not on file  Tobacco Use  . Smoking status: Former Research scientist (life sciences)  . Smokeless tobacco: Never Used  Substance and Sexual Activity  . Alcohol use: Yes    Alcohol/week: 7.0 standard drinks    Types: 7 Glasses of wine per week    Comment: 5-7 glasses wine weekly  . Drug use: No  . Sexual activity: Not on file  Lifestyle  . Physical activity    Days per week: Not on file    Minutes per session: Not on file  . Stress: Not on file  Relationships  . Social Herbalist on phone: Not on file    Gets together: Not on file  Attends religious service: Not on file    Active member of club or organization: Not on file    Attends meetings of clubs or organizations: Not on file    Relationship status: Not on file  Other Topics Concern  . 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 aspirin 81 MG tablet Take 1 tablet (81 mg total) by mouth daily.  . calcitRIOL (ROCALTROL) 0.25 MCG capsule TAKE 1 CAPSULE (0.25 MCG TOTAL) BY MOUTH DAILY. TAKE MONDAY WEDNESDAY AND FRIDAY.  . carvedilol (COREG) 3.125 MG tablet Take 1 tablet (3.125 mg total) by mouth 2 (two) times daily.  . Cholecalciferol (VITAMIN D) 2000 units CAPS Take 2,000 Units by  mouth daily.  . cyanocobalamin (,VITAMIN B-12,) 1000 MCG/ML injection Inject 1 Ml once monthly  . ezetimibe-simvastatin (VYTORIN) 10-80 MG tablet Take 1 tablet by mouth daily.  . fenofibrate (TRICOR) 145 MG tablet Take 1 tablet (145 mg total) by mouth daily.  Marland Kitchen glucose blood (ONETOUCH VERIO) test strip 1 each by Other route daily. And lancets 1/day  . hydrochlorothiazide (HYDRODIURIL) 25 MG tablet Take 1 tablet (25 mg total) by mouth daily.  Marland Kitchen latanoprost (XALATAN) 0.005 % ophthalmic solution Place 1 drop into both eyes at bedtime.  Marland Kitchen omeprazole (PRILOSEC) 20 MG capsule Take 1 capsule (20 mg total) by mouth daily.  . pioglitazone (ACTOS) 45 MG tablet Take 1 tablet (45 mg total) by mouth daily.   No facility-administered encounter medications on file as of 12/06/2018.     Activities of Daily Living In your present state of health, do you have any difficulty performing the following activities: 12/06/2018  Hearing? N  Vision? N  Difficulty concentrating or making decisions? N  Walking or climbing stairs? N  Dressing or bathing? N  Doing errands, shopping? N  Preparing Food and eating ? N  Using the Toilet? N  In the past six months, have you accidently leaked urine? N  Do you have problems with loss of bowel control? N  Managing your Medications? N  Managing your Finances? N  Housekeeping or managing your Housekeeping? N  Some recent data might be hidden    Patient Care Team: [name], MD as PCP - General (Family Medicine) [name], MD as Attending Physician (Ophthalmology) [name], MD as Attending Physician (Urology) [name], MD as Consulting Physician (Nephrology)   Assessment:   This is a routine wellness examination for Mitchell Alvarado.  Exercise Activities and Dietary recommendations Current Exercise Habits: The patient does not participate in regular exercise at present  Goals   None     Fall Risk Fall Risk  12/06/2018 12/06/2018 05/29/2017  Falls in  the past year? 0 0 No  Number falls in past yr: 0 - -  Injury with Fall? 0 - -  Follow up Education provided;Falls evaluation completed - -   Is the patient's home free of loose throw rugs in walkways, pet beds, electrical cords, etc?   yes      Grab bars in the bathroom? yes      Handrails on the stairs?   yes      Adequate lighting?   yes  Timed Get Up and Go Performed: completed and within normal timeframe; no gait abnormalities  noted    Depression Screen PHQ 2/9 Scores 12/06/2018 12/06/2018 06/04/2018 05/29/2017  PHQ - 2 Score 0 0 0 0    Cognitive Function     6CIT Screen 12/06/2018  What Year? 0 points  What month? 0 points  What time? 0 points  Count back from 20 0 points  Months in reverse 0 points  Repeat phrase 0 points  Total Score 0    Immunization History  Administered Date(s) Administered  . Fluad Quad(high Dose 65+) 12/06/2018  . Influenza Whole 12/26/2008  . Influenza,inj,Quad PF,6+ Mos 11/29/2016  . Influenza-Unspecified 12/27/2012, 12/06/2013, 03/28/2014, 12/12/2017  . Pneumococcal Conjugate-13 05/26/2015  . Pneumococcal Polysaccharide-23 05/15/2009, 11/28/2017  . Td 01/27/1995  . Tdap 01/15/2014  . Zoster 05/26/2015    Qualifies for Shingles Vaccine? Discussed and patient will check with pharmacy for coverage.  Patient education handout provided    Screening Tests Health Maintenance  Topic Date Due  . HEMOGLOBIN A1C  12/05/2018  . OPHTHALMOLOGY EXAM  01/20/2019  . FOOT EXAM  06/04/2019  . URINE MICROALBUMIN  06/04/2019  . COLONOSCOPY  07/26/2020  . TETANUS/TDAP  01/16/2024  . INFLUENZA VACCINE  Completed  . Hepatitis C Screening  Completed  . PNA vac Low Risk Adult  Completed   Cancer Screenings: Lung: Low Dose CT Chest recommended if Age 7-80 years, 30 pack-year currently smoking OR have quit w/in 15years. Patient does not qualify. Colorectal: completed; last 07/27/15 with Dr. Carlean Purl       Plan:    I have personally reviewed and addressed  the Medicare Annual Wellness questionnaire and have noted the following in the patient's chart:  A. Medical and social history B. Use of alcohol, tobacco or illicit drugs  C. Current medications and supplements D. Functional ability and status E.  Nutritional status F.  Physical activity G. Advance directives H. List of other physicians I.  Hospitalizations, surgeries, and ER visits in previous 12 months J.  [place] such as hearing and vision if needed, cognitive and depression L. Referrals, records requested, and appointments- none   In addition, I have reviewed and discussed with patient certain preventive protocols, quality metrics, and best practice recommendations. A written personalized care plan for preventive services as well as general preventive health recommendations were provided to patient.   Signed,  Denman George, LPN  Nurse Health Advisor   Nurse Notes: no additional

## 2018-12-06 NOTE — Progress Notes (Signed)
   Chief Complaint:  Mitchell Alvarado is a 68 y.o. male who presents today with a chief complaint of T2DM.   Assessment/Plan:  Hypertension associated with diabetes ([place]) At goal.  Continue Coreg 3.125 mg twice daily, HCTZ 25 mg daily.  Dyslipidemia associated with type 2 diabetes mellitus ([facility]) Stable.  Continue Vytorin and fenofibrate.  Check lipid panel with next blood draw.  Diabetes ([place]) Last A1c 5.7.  Continue Actos 45 mg daily.  Check A1c next blood draw.  Preventive healthcare Flu vaccine given today    Subjective:  HPI:  His stable, chronic medical conditions are outlined below:  # T2DM - On Actos 45mg  daily.  Tolerating well without side effects. - ROS: No reported polyuria or polydipsia.  # Essential Hypertension - On coreg 3.125mg  twice daily and HCTZ 25mg  daily and tolerating well. - ROS: No reported chest pain or shortness of breath.  # Dyslipidemia - On vytorin 10-80 once daily and tolerating well. - ROS: No myalgias.  ROS: Per HPI  PMH: He reports that he has quit smoking. He has never used smokeless tobacco. He reports current alcohol use of about 7.0 standard drinks of alcohol per week. He reports that he does not use drugs.      Objective:  Physical Exam: BP 134/78   Pulse 72   Temp 98 F (36.7 C)   Ht 6\' 2"  (1.88 m)   Wt 263 lb 6.1 oz (119.5 kg)   SpO2 99%   BMI 33.82 kg/m   Wt Readings from Last 3 Encounters:  12/06/18 263 lb 7.2 oz (119.5 kg)  12/06/18 263 lb 6.1 oz (119.5 kg)  08/28/18 264 lb (119.7 kg)  Gen: NAD, resting comfortably CV: Regular rate and rhythm with no murmurs appreciated Pulm: Normal work of breathing, clear to auscultation bilaterally with no crackles, wheezes, or rhonchi GI: Normal bowel sounds present. Soft, Nontender, Nondistended. MSK: No edema, cyanosis, or clubbing noted Skin: Warm, dry Neuro: Grossly normal, moves all extremities Psych: Normal affect and thought content      Caleb M. Jerline Pain,  MD 12/06/2018 2:30 PM

## 2018-12-06 NOTE — Assessment & Plan Note (Signed)
At goal.  Continue Coreg 3.125 mg twice daily, HCTZ 25 mg daily.

## 2018-12-06 NOTE — Assessment & Plan Note (Signed)
Last A1c 5.7.  Continue Actos 45 mg daily.  Check A1c next blood draw.

## 2018-12-06 NOTE — Patient Instructions (Addendum)
[name] , Thank you for taking time to come for your Medicare Wellness Visit. I appreciate your ongoing commitment to your health goals. Please review the following plan we discussed and let me know if I can assist you in the future.   Screening recommendations/referrals: Colorectal Screening: completed 07/27/15 with Dr. Carlean Purl   Vision and Dental Exams: Recommended annual ophthalmology exams for early detection of glaucoma and other disorders of the eye Recommended annual dental exams for proper oral hygiene  Diabetic Exams: Diabetic Eye Exam: yearly Diabetic Foot Exam: completed  Vaccinations: Influenza vaccine: today  Pneumococcal vaccine: up to date; last 11/28/17 Tdap vaccine: up to date; last 01/15/14  Shingles vaccine: Please call your insurance company to determine your out of pocket expense for the Shingrix vaccine. You may receive this vaccine at your local pharmacy.  Advanced directives: Please bring a copy of your POA (Power of Attorney) and/or Living Will to your next appointment.  Goals: Recommend to decrease portion sizes by eating 3 small healthy meals and at least 2 healthy snacks per day.   Next appointment: Please schedule your Annual Wellness Visit with your Nurse Health Advisor in one year.  Preventive Care 18 Years and Older, Male Preventive care refers to lifestyle choices and visits with your health care provider that can promote health and wellness. What does preventive care include?  A yearly physical exam. This is also called an annual well check.  Dental exams once or twice a year.  Routine eye exams. Ask your health care provider how often you should have your eyes checked.  Personal lifestyle choices, including:  Daily care of your teeth and gums.  Regular physical activity.  Eating a healthy diet.  Avoiding tobacco and drug use.  Limiting alcohol use.  Practicing safe sex.  Taking low doses of aspirin every day if recommended by your  health care provider..  Taking vitamin and mineral supplements as recommended by your health care provider. What happens during an annual well check? The services and screenings done by your health care provider during your annual well check will depend on your age, overall health, lifestyle risk factors, and family history of disease. Counseling  Your health care provider may ask you questions about your:  Alcohol use.  Tobacco use.  Drug use.  Emotional well-being.  Home and relationship well-being.  Sexual activity.  Eating habits.  History of falls.  Memory and ability to understand (cognition).  Work and work Statistician. Screening  You may have the following tests or measurements:  Height, weight, and BMI.  Blood pressure.  Lipid and cholesterol levels. These may be checked every 5 years, or more frequently if you are over 19 years old.  Skin check.  Lung cancer screening. You may have this screening every year starting at age 30 if you have a 30-pack-year history of smoking and currently smoke or have quit within the past 15 years.  Fecal occult blood test (FOBT) of the stool. You may have this test every year starting at age 9.  Flexible sigmoidoscopy or colonoscopy. You may have a sigmoidoscopy every 5 years or a colonoscopy every 10 years starting at age 22.  Prostate cancer screening. Recommendations will vary depending on your family history and other risks.  Hepatitis C blood test.  Hepatitis B blood test.  Sexually transmitted disease (STD) testing.  Diabetes screening. This is done by checking your blood sugar (glucose) after you have not eaten for a while (fasting). You may have this  done every 1-3 years.  Abdominal aortic aneurysm (AAA) screening. You may need this if you are a current or former smoker.  Osteoporosis. You may be screened starting at age 89 if you are at high risk. Talk with your health care provider about your test results,  treatment options, and if necessary, the need for more tests. Vaccines  Your health care provider may recommend certain vaccines, such as:  Influenza vaccine. This is recommended every year.  Tetanus, diphtheria, and acellular pertussis (Tdap, Td) vaccine. You may need a Td booster every 10 years.  Zoster vaccine. You may need this after age 65.  Pneumococcal 13-valent conjugate (PCV13) vaccine. One dose is recommended after age 53.  Pneumococcal polysaccharide (PPSV23) vaccine. One dose is recommended after age 66. Talk to your health care provider about which screenings and vaccines you need and how often you need them. This information is not intended to replace advice given to you by your health care provider. Make sure you discuss any questions you have with your health care provider. Document Released: 04/10/2015 Document Revised: 12/02/2015 Document Reviewed: 01/13/2015 Elsevier Interactive Patient Education  2017 [place] Prevention in the Home Falls can cause injuries. They can happen to people of all ages. There are many things you can do to make your home safe and to help prevent falls. What can I do on the outside of my home?  Regularly fix the edges of walkways and driveways and fix any cracks.  Remove anything that might make you trip as you walk through a door, such as a raised step or threshold.  Trim any bushes or trees on the path to your home.  Use bright outdoor lighting.  Clear any walking paths of anything that might make someone trip, such as rocks or tools.  Regularly check to see if handrails are loose or broken. Make sure that both sides of any steps have handrails.  Any raised decks and porches should have guardrails on the edges.  Have any leaves, snow, or ice cleared regularly.  Use sand or salt on walking paths during winter.  Clean up any spills in your garage right away. This includes oil or grease spills. What can I do in the  bathroom?  Use night lights.  Install grab bars by the toilet and in the tub and shower. Do not use towel bars as grab bars.  Use non-skid mats or decals in the tub or shower.  If you need to sit down in the shower, use a plastic, non-slip stool.  Keep the floor dry. Clean up any water that spills on the floor as soon as it happens.  Remove soap buildup in the tub or shower regularly.  Attach bath mats securely with double-sided non-slip rug tape.  Do not have throw rugs and other things on the floor that can make you trip. What can I do in the bedroom?  Use night lights.  Make sure that you have a light by your bed that is easy to reach.  Do not use any sheets or blankets that are too big for your bed. They should not hang down onto the floor.  Have a firm chair that has side arms. You can use this for support while you get dressed.  Do not have throw rugs and other things on the floor that can make you trip. What can I do in the kitchen?  Clean up any spills right away.  Avoid walking on wet floors.  Keep items that you use a lot in easy-to-reach places.  If you need to reach something above you, use a strong step stool that has a grab bar.  Keep electrical cords out of the way.  Do not use floor polish or wax that makes floors slippery. If you must use wax, use non-skid floor wax.  Do not have throw rugs and other things on the floor that can make you trip. What can I do with my stairs?  Do not leave any items on the stairs.  Make sure that there are handrails on both sides of the stairs and use them. Fix handrails that are broken or loose. Make sure that handrails are as long as the stairways.  Check any carpeting to make sure that it is firmly attached to the stairs. Fix any carpet that is loose or worn.  Avoid having throw rugs at the top or bottom of the stairs. If you do have throw rugs, attach them to the floor with carpet tape.  Make sure that you have a  light switch at the top of the stairs and the bottom of the stairs. If you do not have them, ask someone to add them for you. What else can I do to help prevent falls?  Wear shoes that:  Do not have high heels.  Have rubber bottoms.  Are comfortable and fit you well.  Are closed at the toe. Do not wear sandals.  If you use a stepladder:  Make sure that it is fully opened. Do not climb a closed stepladder.  Make sure that both sides of the stepladder are locked into place.  Ask someone to hold it for you, if possible.  Clearly mark and make sure that you can see:  Any grab bars or handrails.  First and last steps.  Where the edge of each step is.  Use tools that help you move around (mobility aids) if they are needed. These include:  Canes.  Walkers.  Scooters.  Crutches.  Turn on the lights when you go into a dark area. Replace any light bulbs as soon as they burn out.  Set up your furniture so you have a clear path. Avoid moving your furniture around.  If any of your floors are uneven, fix them.  If there are any pets around you, be aware of where they are.  Review your medicines with your doctor. Some medicines can make you feel dizzy. This can increase your chance of falling. Ask your doctor what other things that you can do to help prevent falls. This information is not intended to replace advice given to you by your health care provider. Make sure you discuss any questions you have with your health care provider. Document Released: 01/08/2009 Document Revised: 08/20/2015 Document Reviewed: 04/18/2014 Elsevier Interactive Patient Education  2017 [employer].

## 2018-12-19 ENCOUNTER — Other Ambulatory Visit: Payer: Self-pay | Admitting: Family Medicine

## 2019-01-23 LAB — HM DIABETES EYE EXAM

## 2019-02-27 ENCOUNTER — Inpatient Hospital Stay: Payer: Medicare Other | Admitting: Hematology

## 2019-02-27 ENCOUNTER — Telehealth: Payer: Self-pay | Admitting: Hematology

## 2019-02-27 ENCOUNTER — Inpatient Hospital Stay: Payer: Medicare Other | Attending: Hematology

## 2019-02-27 ENCOUNTER — Other Ambulatory Visit: Payer: Self-pay

## 2019-02-27 VITALS — BP 168/80 | HR 100 | Temp 97.5°F | Resp 18 | Ht 74.0 in | Wt 264.9 lb

## 2019-02-27 DIAGNOSIS — D510 Vitamin B12 deficiency anemia due to intrinsic factor deficiency: Secondary | ICD-10-CM | POA: Insufficient documentation

## 2019-02-27 DIAGNOSIS — D539 Nutritional anemia, unspecified: Secondary | ICD-10-CM | POA: Insufficient documentation

## 2019-02-27 DIAGNOSIS — E538 Deficiency of other specified B group vitamins: Secondary | ICD-10-CM

## 2019-02-27 LAB — CMP (CANCER CENTER ONLY)
ALT: 20 U/L (ref 0–44)
AST: 24 U/L (ref 15–41)
Albumin: 3.8 g/dL (ref 3.5–5.0)
Alkaline Phosphatase: 28 U/L — ABNORMAL LOW (ref 38–126)
Anion gap: 13 (ref 5–15)
BUN: 24 mg/dL — ABNORMAL HIGH (ref 8–23)
CO2: 23 mmol/L (ref 22–32)
Calcium: 9.6 mg/dL (ref 8.9–10.3)
Chloride: 105 mmol/L (ref 98–111)
Creatinine: 1.73 mg/dL — ABNORMAL HIGH (ref 0.61–1.24)
GFR, Est AFR Am: 46 mL/min — ABNORMAL LOW (ref >60)
GFR, Estimated: 40 mL/min — ABNORMAL LOW (ref >60)
Glucose, Bld: 111 mg/dL — ABNORMAL HIGH (ref 70–99)
Potassium: 4.1 mmol/L (ref 3.5–5.1)
Sodium: 141 mmol/L (ref 135–145)
Total Bilirubin: 0.4 mg/dL (ref 0.3–1.2)
Total Protein: 6.7 g/dL (ref 6.5–8.1)

## 2019-02-27 LAB — CBC WITH DIFFERENTIAL/PLATELET
Abs Immature Granulocytes: 0 10*3/uL (ref 0.00–0.07)
Basophils Absolute: 0 10*3/uL (ref 0.0–0.1)
Basophils Relative: 1 %
Eosinophils Absolute: 0.1 10*3/uL (ref 0.0–0.5)
Eosinophils Relative: 2 %
HCT: 29.9 % — ABNORMAL LOW (ref 39.0–52.0)
Hemoglobin: 10 g/dL — ABNORMAL LOW (ref 13.0–17.0)
Immature Granulocytes: 0 %
Lymphocytes Relative: 51 %
Lymphs Abs: 1.6 10*3/uL (ref 0.7–4.0)
MCH: 34.4 pg — ABNORMAL HIGH (ref 26.0–34.0)
MCHC: 33.4 g/dL (ref 30.0–36.0)
MCV: 102.7 fL — ABNORMAL HIGH (ref 80.0–100.0)
Monocytes Absolute: 0.3 10*3/uL (ref 0.1–1.0)
Monocytes Relative: 11 %
Neutro Abs: 1.1 10*3/uL — ABNORMAL LOW (ref 1.7–7.7)
Neutrophils Relative %: 35 %
Platelets: 195 10*3/uL (ref 150–400)
RBC: 2.91 MIL/uL — ABNORMAL LOW (ref 4.22–5.81)
RDW: 14.1 % (ref 11.5–15.5)
WBC: 3 10*3/uL — ABNORMAL LOW (ref 4.0–10.5)
nRBC: 0 % (ref 0.0–0.2)

## 2019-02-27 LAB — FERRITIN: Ferritin: 1211 ng/mL — ABNORMAL HIGH (ref 24–336)

## 2019-02-27 LAB — VITAMIN B12: Vitamin B-12: 944 pg/mL — ABNORMAL HIGH (ref 180–914)

## 2019-02-27 LAB — IRON AND TIBC
Iron: 134 ug/dL (ref 42–163)
Saturation Ratios: 43 % (ref 20–55)
TIBC: 311 ug/dL (ref 202–409)
UIBC: 178 ug/dL (ref 117–376)

## 2019-02-27 NOTE — Telephone Encounter (Signed)
Gave avs and calendar ° °

## 2019-02-27 NOTE — Progress Notes (Signed)
HEMATOLOGY/ONCOLOGY CLINIC NOTE  Date of Service: 02/27/2019  Patient Care Team: [name], MD as PCP - General (Family Medicine) [name], MD as Attending Physician (Ophthalmology) [name], MD as Attending Physician (Urology) [name], MD as Consulting Physician (Nephrology)  CHIEF COMPLAINTS/PURPOSE OF CONSULTATION:  Anemia  HISTORY OF PRESENTING ILLNESS:   Mitchell Alvarado is a wonderful 68 y.o. male who has been referred to [place] by Dr Renato Shin for evaluation and management of anemia. The pt reports that he is doing well overall.   The pt reports that he has been taking monthly Vitamin B12 injections for the last 4-5 years, and his deficiency was first realized through routine blood work. He was taken off Metformin at the time. He has taken Omeprazole for 20 years for his Barrett's esophagus. He denies any food intolerances or thyroid problems. He notes that he has had some fatigue which he attributes to getting older.   He has seen a nephrologist several years ago and was discharged after replacing his Vitamin D satisfactorily and he denies any liver problems as well.   Most recent lab results (05/29/17) of CBC  is as follows: all values are WNL except for WBC at 3.5k, RBC at 3.04, HGB at 11.3, HCT at 32.9, MCV at 108.0.  Review of CBC w/diff over the last two years reveals stability in the above abnormalities.   On review of systems, pt reports some fatigue, stable weight, and denies problems of infection, bleeding, back pains, pain along the spine, abdominal pains, changes in bowel habits, leg selling, urinary discomfort, and any other symptoms.   On PMHx the pt reports neuropathy, prostate surgery in 2000, denies Afib. On Social Hx the pt reports 10-14 glasses of wine each week  and denies any concern for excessive consumption. He denies smoking and smoked cigarettes for one year a long time ago  On Family Hx the pt reports paternal heart  attack.  Interval History:   Mitchell Alvarado returns today for management and evaluation of his macrocytic anemia. The patient's last visit with [place] was on 08/28/2018. The pt reports that he is doing well overall.  The pt reports that he has had no new concerns in the interim and had a great Thanksgiving holiday. Pt's blood sugars have been stable. He continues to consume 1-2 bottles of wine per week. Pt continues to take a Vitamin B-12 supplement but is not taking a B-complex vitamin.   Lab results today (02/27/19) of CBC w/diff and CMP is as follows: all values are WNL except for WBC at 3.0K, RBC at 2.91, Hgb at 10.0, HCT at 29.9, MCV at 102.7, MCH at 34.4, Neutro Abs at 1.1K, Glucose at 111, BUN at 24, Creatinine at 1.73, Alkaline Phosphatase at 28, GFR Est Non Af Am at 40. 02/27/2019 Ferritin at 1211 02/27/2019 Vitamin B12 at 944 02/27/2019 Erythropoietin is in progress  02/27/2019 Iron and TIBC is as follows: all values are WNL   On review of systems, pt denies fevers, chills, night sweats, new fatigue and any other symptoms.   MEDICAL HISTORY:  Past Medical History:  Diagnosis Date  . ANEMIA, IRON DEFICIENCY 12/23/2009  . Barrett's esophagus without dysplasia 05/2012  . DIABETES MELLITUS, TYPE II 12/30/2006  . ECZEMA 05/15/2009  . GERD 12/30/2006  . HYPERLIPIDEMIA 12/30/2006  . HYPERTENSION 12/30/2006  . HYPERTHYROIDISM 06/03/2008  . Pernicious anemia 12/30/2006  . Personal history of colonic polyps - adenoma 07/22/2004    SURGICAL HISTORY:  Past Surgical History:  Procedure Laterality Date  . COLONOSCOPY  multiple  . ESOPHAGOGASTRODUODENOSCOPY  multiple  . TRANSURETHRAL RESECTION OF PROSTATE  2000    SOCIAL HISTORY: Social History   Socioeconomic History  . Marital status: Legally Separated    Spouse name: Not on file  . Number of children: Not on file  . Years of education: Not on file  . Highest education level: Not on file  Occupational History  . Occupation: Retired  from school system  Social Needs  . Financial resource strain: Not on file  . Food insecurity    Worry: Not on file    Inability: Not on file  . Transportation needs    Medical: Not on file    Non-medical: Not on file  Tobacco Use  . Smoking status: Former Research scientist (life sciences)  . Smokeless tobacco: Never Used  Substance and Sexual Activity  . Alcohol use: Yes    Alcohol/week: 7.0 standard drinks    Types: 7 Glasses of wine per week    Comment: 5-7 glasses wine weekly  . Drug use: No  . Sexual activity: Not on file  Lifestyle  . Physical activity    Days per week: Not on file    Minutes per session: Not on file  . Stress: Not on file  Relationships  . Social Herbalist on phone: Not on file    Gets together: Not on file    Attends religious service: Not on file    Active member of club or organization: Not on file    Attends meetings of clubs or organizations: Not on file    Relationship status: Not on file  . Intimate partner violence    Fear of current or ex partner: Not on file    Emotionally abused: Not on file    Physically abused: Not on file    Forced sexual activity: Not on file  Other Topics Concern  . 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 Heart attack Father        MI  . Depression Son   . COPD Son   . Cancer Neg Hx   . Colon cancer Neg Hx     ALLERGIES:  has No Known Allergies.  MEDICATIONS:  Current Outpatient Medications  Medication Sig Dispense Refill  . aspirin 81 MG tablet Take 1 tablet (81 mg total) by mouth daily. 90 tablet 3  . calcitRIOL (ROCALTROL) 0.25 MCG capsule TAKE 1 CAPSULE (0.25 MCG TOTAL) BY MOUTH DAILY. TAKE MONDAY WEDNESDAY AND FRIDAY. 120 capsule 1  . carvedilol (COREG) 3.125 MG tablet Take 1 tablet (3.125 mg  total) by mouth 2 (two) times daily. 180 tablet 3  . Cholecalciferol (VITAMIN D) 2000 units CAPS Take 2,000 Units by mouth daily.    . cyanocobalamin (,VITAMIN B-12,) 1000 MCG/ML injection INJECT 1 ML ONCE MONTHLY 3 mL 0  . ezetimibe-simvastatin (VYTORIN) 10-80 MG tablet Take 1 tablet by mouth daily. 90 tablet 3  . fenofibrate (TRICOR) 145 MG tablet Take 1 tablet (145 mg total) by mouth daily. 90 tablet 3  . glucose blood (ONETOUCH VERIO) test strip 1 each by Other route daily. And lancets 1/day 100 each 3  . hydrochlorothiazide (HYDRODIURIL) 25  MG tablet Take 1 tablet (25 mg total) by mouth daily. 90 tablet 3  . latanoprost (XALATAN) 0.005 % ophthalmic solution Place 1 drop into both eyes at bedtime.    Marland Kitchen omeprazole (PRILOSEC) 20 MG capsule Take 1 capsule (20 mg total) by mouth daily. 90 capsule 3  . pioglitazone (ACTOS) 45 MG tablet Take 1 tablet (45 mg total) by mouth daily. 90 tablet 3   No current facility-administered medications for this visit.     REVIEW OF SYSTEMS:   A 10+ POINT REVIEW OF SYSTEMS WAS OBTAINED including neurology, dermatology, psychiatry, cardiac, respiratory, lymph, extremities, GI, GU, Musculoskeletal, constitutional, breasts, reproductive, HEENT.  All pertinent positives are noted in the HPI.  All others are negative.   PHYSICAL EXAMINATION:  . Vitals:   02/27/19 1319  BP: (!) 168/80  Pulse: 100  Resp: 18  Temp: (!) 97.5 F (36.4 C)  SpO2: 99%   Filed Weights   02/27/19 1319  Weight: 264 lb 14.4 oz (120.2 kg)   .Body mass index is 34.01 kg/m.   GENERAL:alert, in no acute distress and comfortable SKIN: no acute rashes, no significant lesions EYES: conjunctiva are pink and non-injected, sclera anicteric OROPHARYNX: MMM, no exudates, no oropharyngeal erythema or ulceration NECK: supple, no JVD LYMPH:  no palpable lymphadenopathy in the cervical, axillary or inguinal regions LUNGS: clear to auscultation b/l with normal respiratory effort HEART:  regular rate & rhythm ABDOMEN:  normoactive bowel sounds , non tender, not distended. No palpable hepatosplenomegaly.  Extremity: no pedal edema PSYCH: alert & oriented x 3 with fluent speech NEURO: no focal motor/sensory deficits  LABORATORY DATA:  I have reviewed the data as listed  . CBC Latest Ref Rng & Units 02/27/2019 08/28/2018 06/04/2018  WBC 4.0 - 10.5 K/uL 3.0(L) 3.7(L) 2.4(L)  Hemoglobin 13.0 - 17.0 g/dL 10.0(L) 10.0(L) 10.3(L)  Hematocrit 39.0 - 52.0 % 29.9(L) 30.2(L) 30.0(L)  Platelets 150 - 400 K/uL 195 197 156.0   . CBC    Component Value Date/Time   WBC 3.0 (L) 02/27/2019 1140   RBC 2.91 (L) 02/27/2019 1140   HGB 10.0 (L) 02/27/2019 1140   HCT 29.9 (L) 02/27/2019 1140   HCT 31.1 (L) 09/07/2017 1405   PLT 195 02/27/2019 1140   MCV 102.7 (H) 02/27/2019 1140   MCH 34.4 (H) 02/27/2019 1140   MCHC 33.4 02/27/2019 1140   RDW 14.1 02/27/2019 1140   LYMPHSABS 1.6 02/27/2019 1140   MONOABS 0.3 02/27/2019 1140   EOSABS 0.1 02/27/2019 1140   BASOSABS 0.0 02/27/2019 1140     . CMP Latest Ref Rng & Units 02/27/2019 08/28/2018 06/04/2018  Glucose 70 - 99 mg/dL 111(H) 132(H) 103(H)  BUN 8 - 23 mg/dL 24(H) 19 25(H)  Creatinine 0.61 - 1.24 mg/dL 1.73(H) 1.81(H) 1.58(H)  Sodium 135 - 145 mmol/L 141 139 139  Potassium 3.5 - 5.1 mmol/L 4.1 4.4 4.0  Chloride 98 - 111 mmol/L 105 104 103  CO2 22 - 32 mmol/L 23 27 26   Calcium 8.9 - 10.3 mg/dL 9.6 9.5 8.9  Total Protein 6.5 - 8.1 g/dL 6.7 6.8 6.0  Total Bilirubin 0.3 - 1.2 mg/dL 0.4 0.4 1.0  Alkaline Phos 38 - 126 U/L 28(L) 28(L) 57  AST 15 - 41 U/L 24 23 39(H)  ALT 0 - 44 U/L 20 15 33   Component     Latest Ref Rng & Units 09/07/2017  IgG (Immunoglobin G), Serum     700 - 1,600 mg/dL 823  IgA  61 - 437 mg/dL 248  IgM (Immunoglobulin M), Srm     20 - 172 mg/dL 143  Total Protein ELP     6.0 - 8.5 g/dL 6.7  Albumin SerPl Elph-Mcnc     2.9 - 4.4 g/dL 4.0  Alpha 1     0.0 - 0.4 g/dL 0.2  Alpha2 Glob SerPl Elph-Mcnc      0.4 - 1.0 g/dL 0.8  B-Globulin SerPl Elph-Mcnc     0.7 - 1.3 g/dL 1.1  Gamma Glob SerPl Elph-Mcnc     0.4 - 1.8 g/dL 0.6  M Protein SerPl Elph-Mcnc     Not Observed g/dL Not Observed  Globulin, Total     2.2 - 3.9 g/dL 2.7  Albumin/Glob SerPl     0.7 - 1.7 1.5  IFE 1      Comment  Please Note (HCV):      Comment  Folate, Hemolysate     Not Estab. ng/mL 204.0  HCT     37.5 - 51.0 % 31.1 (L)  Folate, RBC     >498 ng/mL 656  Vitamin B12     180 - 914 pg/mL 6,847 (H)  TSH     0.320 - 4.118 uIU/mL 1.016  Ferritin     22 - 316 ng/mL 867 (H)  Copper     72 - 166 ug/dL 97  Parietal Cell Antibody-IgG     0.0 - 20.0 Units 10.9  Intrinsic Factor     0.0 - 1.1 AU/mL 11.8 (H)    RADIOGRAPHIC STUDIES: I have personally reviewed the radiological images as listed and agreed with the findings in the report. No results found.  ASSESSMENT & PLAN:   68 y.o. male with  1. Macrocytic Anemia Review of patient's CBCs w/diff over the last two years reveals stability on the above abnormalities  Patient appears to have pernicious anemia with anti intrinsic factor antibodies. Currently no B12 or folate deficiency deficiency.but could have other B vit deficiencies. No monoclonal paraproteinemia. Concern for heavy ETOH use Has some CKD that could be contributing to the anemia as well. No known liver disease No overt bleeding noted.  2. B12 deficiency -multifactorial -- pernicious anemia + previous metformin use + current PPI use.  PLAN: -Discussed pt labwork today, 02/27/19; all values are WNL except for WBC at 3.0K, RBC at 2.91, Hgb at 10.0, HCT at 29.9, MCV at 102.7, MCH at 34.4, Neutro Abs at 1.1K, Glucose at 111, BUN at 24, Creatinine at 1.73, Alkaline Phosphatase at 28, GFR Est Non Af Am at 40. -Discussed 02/27/2019 Erythropoietin is in progress  -Discussed 02/27/2019 Vitamin B12 at 944 -Discussed 02/27/2019 Ferritin at 1211 -Discussed 02/27/2019 Iron and TIBC is as follows: all  values are WNL  -Continue Vitamin B12 supplement -Recommended to empirically start taking Vitamin B complex  -Recommend continuing to minimize alcohol consumption -Hgb has been continuously low but not progressive. Anemia does not appear to be due to nutritional deficiencies or hemolysis. Discussed that I suspect either alcohol vs medication effect. -Vitamin B12 supplementation has not corrected pt's anemia and is not the primary cause     -There is a concern for MDS -Discussed aggressive options (BM Bx) vs. conservative options (watching with labs) -Pt prefers to watch with labs at this time -There could be a role for further liver evaluation due to previous concerns of Fatty Liver - will defer to PCP -Will see back in 6 months with labs  3. . Patient Active Problem List  Diagnosis Date Noted  . Hyperparathyroidism, secondary ([place]) 05/27/2016  . CKD stage 3 secondary to diabetes ([place]) 05/21/2012  . Iron deficiency anemia 12/23/2009  . ECZEMA 05/15/2009  . Diabetes ([place]) 12/30/2006  . Dyslipidemia associated with type 2 diabetes mellitus ([place]) 12/30/2006  . PERNICIOUS ANEMIA 12/30/2006  . Hypertension associated with diabetes ([place]) 12/30/2006  . GERD 12/30/2006  . Personal history of colonic polyps - adenoma 07/22/2004  . Barrett's esophagus -less than 1 cm, does not meet current criteria for this diagnosis 01/24/2002  -Mx per PCP  FOLLOW UP: RTC with Dr Irene Limbo with labs in 6 months  The total time spent in the appt was 15 minutes and more than 50% was on counseling and direct patient cares.  All of the patient's questions were answered with apparent satisfaction. The patient knows to call the clinic with any problems, questions or concerns.   Sullivan Lone MD [place] AAHIVMS [facility] Hematology/Oncology Physician [facility]  (Office):       [phone] (Work cell):  [phone] (Fax):           [phone]  02/27/2019 4:48 PM  I, Yevette Edwards, am acting as a  scribe for Dr. Sullivan Lone.   .I have reviewed the above documentation for accuracy and completeness, and I agree with the above. Brunetta Genera MD

## 2019-02-28 LAB — ERYTHROPOIETIN: Erythropoietin: 8.9 m[IU]/mL (ref 2.6–18.5)

## 2019-03-02 ENCOUNTER — Encounter: Payer: Self-pay | Admitting: Family Medicine

## 2019-05-22 ENCOUNTER — Telehealth: Payer: Self-pay

## 2019-05-22 NOTE — Telephone Encounter (Signed)
MEDICATION:pioglitazone (ACTOS) 45 MG tablet omeprazole (PRILOSEC) 20 MG capsule hydrochlorothiazide (HYDRODIURIL) 25 MG tablet fenofibrate (TRICOR) 145 MG tablet ezetimibe-simvastatin (VYTORIN) 10-80 MG tablet carvedilol (COREG) 3.125 MG tablet PHARMACY: [id number] [place], [place] - [address] Phone:  [phone]  Fax:  [phone]       Comments:  Patient states that all his medication will run out this month. **Let patient know to contact pharmacy at the end of the day to make sure medication is ready. **  ** Please notify patient to allow 48-72 hours to process**  **Encourage patient to contact the pharmacy for refills or they can request refills through [facility]**

## 2019-05-22 NOTE — Telephone Encounter (Signed)
Patient will contact pharmacy ?

## 2019-05-23 MED ORDER — HYDROCHLOROTHIAZIDE 25 MG PO TABS: 25.0000 mg | ORAL_TABLET | Freq: Every day | ORAL | 0 refills | Status: DC

## 2019-05-23 MED ORDER — CARVEDILOL 3.125 MG PO TABS: 3.1250 mg | ORAL_TABLET | Freq: Two times a day (BID) | ORAL | 0 refills | Status: DC

## 2019-05-23 MED ORDER — FENOFIBRATE 145 MG PO TABS: 145.0000 mg | ORAL_TABLET | Freq: Every day | ORAL | 0 refills | Status: DC

## 2019-05-23 MED ORDER — PIOGLITAZONE HCL 45 MG PO TABS: 45.0000 mg | ORAL_TABLET | Freq: Every day | ORAL | 0 refills | Status: DC

## 2019-05-23 MED ORDER — EZETIMIBE-SIMVASTATIN 10-80 MG PO TABS: 1.0000 | ORAL_TABLET | Freq: Every day | ORAL | 0 refills | Status: DC

## 2019-05-23 NOTE — Telephone Encounter (Signed)
Last OV 12/06/18 Last refill(s) 06/04/18 #90 day supply Next OV 06/24/19

## 2019-05-23 NOTE — Telephone Encounter (Signed)
Rx's refilled. 

## 2019-05-23 NOTE — Addendum Note (Signed)
Addended by: Jasper Loser on: 05/23/2019 10:29 AM   Modules accepted: Orders

## 2019-05-26 ENCOUNTER — Other Ambulatory Visit: Payer: Self-pay | Admitting: Family Medicine

## 2019-05-27 ENCOUNTER — Other Ambulatory Visit: Payer: Self-pay

## 2019-05-27 MED ORDER — CARVEDILOL 3.125 MG PO TABS: 3.1250 mg | ORAL_TABLET | Freq: Two times a day (BID) | ORAL | 0 refills | Status: DC

## 2019-05-29 ENCOUNTER — Other Ambulatory Visit: Payer: Self-pay

## 2019-05-30 ENCOUNTER — Other Ambulatory Visit: Payer: Self-pay

## 2019-05-30 MED ORDER — CALCITRIOL 0.25 MCG PO CAPS: 0.2500 ug | ORAL_CAPSULE | Freq: Every day | ORAL | 0 refills | Status: AC

## 2019-06-03 ENCOUNTER — Other Ambulatory Visit: Payer: Self-pay | Admitting: Family Medicine

## 2019-06-03 MED ORDER — CYANOCOBALAMIN 1000 MCG/ML IJ SOLN: INTRAMUSCULAR | 0 refills | Status: DC

## 2019-06-06 ENCOUNTER — Encounter: Payer: Medicare Other | Admitting: Family Medicine

## 2019-06-17 ENCOUNTER — Telehealth: Payer: Self-pay | Admitting: Hematology

## 2019-06-17 NOTE — Telephone Encounter (Signed)
R/s appt due to template change . Left message with appt date and time  unable to reach pt.

## 2019-06-24 ENCOUNTER — Encounter: Payer: Self-pay | Admitting: Family Medicine

## 2019-06-24 ENCOUNTER — Other Ambulatory Visit: Payer: Self-pay

## 2019-06-24 ENCOUNTER — Ambulatory Visit (INDEPENDENT_AMBULATORY_CARE_PROVIDER_SITE_OTHER): Payer: Medicare PPO | Admitting: Family Medicine

## 2019-06-24 VITALS — BP 130/70 | HR 78 | Temp 97.7°F | Ht 74.0 in | Wt 267.4 lb

## 2019-06-24 DIAGNOSIS — D510 Vitamin B12 deficiency anemia due to intrinsic factor deficiency: Secondary | ICD-10-CM | POA: Diagnosis not present

## 2019-06-24 DIAGNOSIS — I152 Hypertension secondary to endocrine disorders: Secondary | ICD-10-CM

## 2019-06-24 DIAGNOSIS — D509 Iron deficiency anemia, unspecified: Secondary | ICD-10-CM

## 2019-06-24 DIAGNOSIS — Z125 Encounter for screening for malignant neoplasm of prostate: Secondary | ICD-10-CM | POA: Diagnosis not present

## 2019-06-24 DIAGNOSIS — Z0001 Encounter for general adult medical examination with abnormal findings: Secondary | ICD-10-CM | POA: Diagnosis not present

## 2019-06-24 DIAGNOSIS — E1169 Type 2 diabetes mellitus with other specified complication: Secondary | ICD-10-CM

## 2019-06-24 DIAGNOSIS — N2581 Secondary hyperparathyroidism of renal origin: Secondary | ICD-10-CM | POA: Diagnosis not present

## 2019-06-24 DIAGNOSIS — M25512 Pain in left shoulder: Secondary | ICD-10-CM

## 2019-06-24 DIAGNOSIS — E1122 Type 2 diabetes mellitus with diabetic chronic kidney disease: Secondary | ICD-10-CM | POA: Diagnosis not present

## 2019-06-24 DIAGNOSIS — E1159 Type 2 diabetes mellitus with other circulatory complications: Secondary | ICD-10-CM

## 2019-06-24 DIAGNOSIS — E785 Hyperlipidemia, unspecified: Secondary | ICD-10-CM | POA: Diagnosis not present

## 2019-06-24 DIAGNOSIS — E669 Obesity, unspecified: Secondary | ICD-10-CM

## 2019-06-24 DIAGNOSIS — N183 Chronic kidney disease, stage 3 unspecified: Secondary | ICD-10-CM

## 2019-06-24 DIAGNOSIS — Z6834 Body mass index (BMI) 34.0-34.9, adult: Secondary | ICD-10-CM

## 2019-06-24 DIAGNOSIS — I10 Essential (primary) hypertension: Secondary | ICD-10-CM

## 2019-06-24 NOTE — Progress Notes (Signed)
Chief Complaint:  Mitchell Alvarado is a 69 y.o. male who presents today for his annual comprehensive physical exam.    Assessment/Plan:  New/Acute Problems: Shoulder Pain Consistent with rotator cuff tendinopathy.  Please referral to sports medicine.  Recommended Tylenol and ice.  Will avoid NSAIDs given CKD stage III.  Chronic Problems Addressed Today: Hypertension associated with diabetes ([place]) At goal.  Continue Coreg 3.125 mg twice daily and HCTZ 25 mg daily.  Check CBC, C met, TSH.  PERNICIOUS ANEMIA Check B12.  Iron deficiency anemia Check iron panel.  Dyslipidemia and hypertriglyceridemia associated with type 2 diabetes mellitus (HCC) Continue Vytorin and fenofibrate at current doses.  Check lipid panel today.  Diabetes (HCC) Check A1c.  Continue Actos 45 mg daily.  Hyperparathyroidism, secondary ([place]) Check PTH and calcium level today.   Body mass index is 34.33 kg/m. / Obese BMI Metric Follow Up - 06/24/19 1449      BMI Metric Follow Up-Please document annually   BMI Metric Follow Up  Education provided        Preventative Healthcare: Check CBC, C met, TSH, lipid panel.  Has already got both Covid vaccines.  He will be looking into getting shingles vaccine soon.  Foot exam performed today. Check PSA. Due for colon cancer screening next year.   Patient Counseling(The following topics were reviewed and/or handout was given):  -Nutrition: Stressed importance of moderation in sodium/caffeine intake, saturated fat and cholesterol, caloric balance, sufficient intake of fresh fruits, vegetables, and fiber.  -Stressed the importance of regular exercise.   -Substance Abuse: Discussed cessation/primary prevention of tobacco, alcohol, or other drug use; driving or other dangerous activities under the influence; availability of treatment for abuse.   -Injury prevention: Discussed safety belts, safety helmets, smoke detector, smoking near bedding or upholstery.   -Sexuality: Discussed sexually transmitted diseases, partner selection, use of condoms, avoidance of unintended pregnancy and contraceptive alternatives.   -Dental health: Discussed importance of regular tooth brushing, flossing, and dental visits.  -Health maintenance and immunizations reviewed. Please refer to Health maintenance section.  Return to care in 1 year for next preventative visit.     Subjective:  HPI:  He has had some left shoulder pain for the past several weeks.  Worse with certain motions.  No specific treatments tried.  No injuries.  Lifestyle Diet: Balanced. Plenty of fruits and vegetables.  Exercise: Limited due to orthopedic conditions.   Depression screen [facility] 2/9 06/24/2019  Decreased Interest 0  Down, Depressed, Hopeless 0  PHQ - 2 Score 0  Altered sleeping 0  Tired, decreased energy 0  Change in appetite 0  Feeling bad or failure about yourself  0  Trouble concentrating 0  Moving slowly or fidgety/restless 0  Suicidal thoughts 0  PHQ-9 Score 0    Health Maintenance Due  Topic Date Due  . HEMOGLOBIN A1C  12/05/2018  . URINE MICROALBUMIN  06/04/2019     ROS: Per HPI, otherwise a complete review of systems was negative.   PMH:  The following were reviewed and entered/updated in epic: Past Medical History:  Diagnosis Date  . ANEMIA, IRON DEFICIENCY 12/23/2009  . Barrett's esophagus without dysplasia 05/2012  . DIABETES MELLITUS, TYPE II 12/30/2006  . ECZEMA 05/15/2009  . GERD 12/30/2006  . HYPERLIPIDEMIA 12/30/2006  . HYPERTENSION 12/30/2006  . HYPERTHYROIDISM 06/03/2008  . Pernicious anemia 12/30/2006  . Personal history of colonic polyps - adenoma 07/22/2004   Patient Active Problem List   Diagnosis Date Noted  .  Hyperparathyroidism, secondary ([place]) 05/27/2016  . CKD stage 3 secondary to diabetes ([place]) 05/21/2012  . Iron deficiency anemia 12/23/2009  . ECZEMA 05/15/2009  . Diabetes ([place]) 12/30/2006  . Dyslipidemia and hypertriglyceridemia  associated with type 2 diabetes mellitus ([place]) 12/30/2006  . PERNICIOUS ANEMIA 12/30/2006  . Hypertension associated with diabetes ([place]) 12/30/2006  . GERD 12/30/2006  . Personal history of colonic polyps - adenoma 07/22/2004  . Barrett's esophagus -less than 1 cm, does not meet current criteria for this diagnosis 01/24/2002   Past Surgical History:  Procedure Laterality Date  . COLONOSCOPY  multiple  . ESOPHAGOGASTRODUODENOSCOPY  multiple  . TRANSURETHRAL RESECTION OF PROSTATE  2000    Family History  Problem Relation Age of Onset  . Heart attack Father        MI  . Depression Son   . COPD Son   . Cancer Neg Hx   . Colon cancer Neg Hx     Medications- reviewed and updated Current Outpatient Medications  Medication Sig Dispense Refill  . aspirin 81 MG tablet Take 1 tablet (81 mg total) by mouth daily. 90 tablet 3  . calcitRIOL (ROCALTROL) 0.25 MCG capsule Take 1 capsule (0.25 mcg total) by mouth daily. Take Monday Wednesday and Friday. 120 capsule 0  . carvedilol (COREG) 3.125 MG tablet Take 1 tablet (3.125 mg total) by mouth 2 (two) times daily. 180 tablet 0  . Cholecalciferol (VITAMIN D) 2000 units CAPS Take 2,000 Units by mouth daily.    . cyanocobalamin (,VITAMIN B-12,) 1000 MCG/ML injection Inject 27m monthly as directed 3 mL 0  . ezetimibe-simvastatin (VYTORIN) 10-80 MG tablet Take 1 tablet by mouth daily. 90 tablet 0  . fenofibrate (TRICOR) 145 MG tablet Take 1 tablet (145 mg total) by mouth daily. 90 tablet 0  . glucose blood (ONETOUCH VERIO) test strip 1 each by Other route daily. And lancets 1/day 100 each 3  . hydrochlorothiazide (HYDRODIURIL) 25 MG tablet Take 1 tablet (25 mg total) by mouth daily. 90 tablet 0  . latanoprost (XALATAN) 0.005 % ophthalmic solution Place 1 drop into both eyes at bedtime.    .Marland Kitchenomeprazole (PRILOSEC) 20 MG capsule TAKE 1 CAPSULE BY MOUTH EVERY DAY 90 capsule 3  . pioglitazone (ACTOS) 45 MG tablet Take 1 tablet (45 mg total) by mouth daily.  90 tablet 0   No current facility-administered medications for this visit.    Allergies-reviewed and updated No Known Allergies  Social History   Socioeconomic History  . Marital status: Legally Separated    Spouse name: Not on file  . Number of children: Not on file  . Years of education: Not on file  . Highest education level: Not on file  Occupational History  . Occupation: Retired from school system  Tobacco Use  . Smoking status: Former SResearch scientist (life sciences) . Smokeless tobacco: Never Used  Substance and Sexual Activity  . Alcohol use: Yes    Alcohol/week: 7.0 standard drinks    Types: 7 Glasses of wine per week    Comment: 5-7 glasses wine weekly  . Drug use: No  . Sexual activity: Not on file  Other Topics Concern  . 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 Difficulty of Paying Living Expenses:  Food Insecurity:   . Worried About Charity fundraiser in the Last Year:   . Arboriculturist in the Last Year:   Transportation Needs:   . Film/video editor (Medical):   Marland Kitchen Lack of Transportation (Non-Medical):   Physical Activity:   . Days of Exercise per Week:   . Minutes of Exercise per Session:   Stress:   . Feeling of Stress :   Social Connections:   . Frequency of Communication with Friends and Family:   . Frequency of Social Gatherings with Friends and Family:   . Attends Religious Services:   . Active Member of Clubs or Organizations:   . Attends Archivist Meetings:   Marland Kitchen Marital Status:         Objective:  Physical Exam: BP 130/70 (BP Location: Left Arm, Patient Position: Sitting, Cuff Size: Large)   Pulse 78   Temp 97.7 F (36.5 C) (Temporal)   Ht 6' 2"  (1.88 m)   Wt 267 lb 6.4 oz (121.3 kg)   SpO2 100%   BMI 34.33 kg/m   Body  mass index is 34.33 kg/m. Wt Readings from Last 3 Encounters:  06/24/19 267 lb 6.4 oz (121.3 kg)  02/27/19 264 lb 14.4 oz (120.2 kg)  12/06/18 263 lb 7.2 oz (119.5 kg)   Gen: NAD, resting comfortably HEENT: TMs normal bilaterally. OP clear. No thyromegaly noted.  CV: RRR with no murmurs appreciated Pulm: NWOB, CTAB with no crackles, wheezes, or rhonchi GI: Normal bowel sounds present. Soft, Nontender, Nondistended. MSK: no edema, cyanosis, or clubbing noted.  Left lateral shoulder tender to palpation.  Pain elicited with movement above his head.  Normal internal and external rotation of left shoulder.  Pain elicited with resisted supraspinatus testing. Skin: warm, dry Neuro: CN2-12 grossly intact. Strength 5/5 in upper and lower extremities. Reflexes symmetric and intact bilaterally.  Psych: Normal affect and thought content     Heather Streeper M. Jerline Pain, MD 06/24/2019 3:47 PM

## 2019-06-24 NOTE — Assessment & Plan Note (Signed)
Check iron panel. 

## 2019-06-24 NOTE — Assessment & Plan Note (Signed)
At goal.  Continue Coreg 3.125 mg twice daily and HCTZ 25 mg daily.  Check CBC, C met, TSH.

## 2019-06-24 NOTE — Assessment & Plan Note (Signed)
Check A1c.  Continue Actos 45 mg daily.

## 2019-06-24 NOTE — Patient Instructions (Signed)
It was very nice to see you today!  We will check blood work and urine sample today.  I will place a referral for you to see the sports medicine doctor.  We will see you back in about 6 months to recheck your blood sugar, or sooner if needed.  Take care, Dr Jerline Pain  Please try these tips to maintain a healthy lifestyle:   Eat at least 3 REAL meals and 1-2 snacks per day.  Aim for no more than 5 hours between eating.  If you eat breakfast, please do so within one hour of getting up.    Each meal should contain half fruits/vegetables, one quarter protein, and one quarter carbs (no bigger than a computer mouse)   Cut down on sweet beverages. This includes juice, soda, and sweet tea.     Drink at least 1 glass of water with each meal and aim for at least 8 glasses per day   Exercise at least 150 minutes every week.    Preventive Care 69 Years and Older, Male Preventive care refers to lifestyle choices and visits with your health care provider that can promote health and wellness. This includes:  A yearly physical exam. This is also called an annual well check.  Regular dental and eye exams.  Immunizations.  Screening for certain conditions.  Healthy lifestyle choices, such as diet and exercise. What can I expect for my preventive care visit? Physical exam Your health care provider will check:  Height and weight. These may be used to calculate body mass index (BMI), which is a measurement that tells if you are at a healthy weight.  Heart rate and blood pressure.  Your skin for abnormal spots. Counseling Your health care provider may ask you questions about:  Alcohol, tobacco, and drug use.  Emotional well-being.  Home and relationship well-being.  Sexual activity.  Eating habits.  History of falls.  Memory and ability to understand (cognition).  Work and work Statistician. What immunizations do I need?  Influenza (flu) vaccine  This is recommended  every year. Tetanus, diphtheria, and pertussis (Tdap) vaccine  You may need a Td booster every 10 years. Varicella (chickenpox) vaccine  You may need this vaccine if you have not already been vaccinated. Zoster (shingles) vaccine  You may need this after age 41. Pneumococcal conjugate (PCV13) vaccine  One dose is recommended after age 65. Pneumococcal polysaccharide (PPSV23) vaccine  One dose is recommended after age 40. Measles, mumps, and rubella (MMR) vaccine  You may need at least one dose of MMR if you were born in 1957 or later. You may also need a second dose. Meningococcal conjugate (MenACWY) vaccine  You may need this if you have certain conditions. Hepatitis A vaccine  You may need this if you have certain conditions or if you travel or work in places where you may be exposed to hepatitis A. Hepatitis B vaccine  You may need this if you have certain conditions or if you travel or work in places where you may be exposed to hepatitis B. Haemophilus influenzae type b (Hib) vaccine  You may need this if you have certain conditions. You may receive vaccines as individual doses or as more than one vaccine together in one shot (combination vaccines). Talk with your health care provider about the risks and benefits of combination vaccines. What tests do I need? Blood tests  Lipid and cholesterol levels. These may be checked every 5 years, or more frequently depending on your overall  health.  Hepatitis C test.  Hepatitis B test. Screening  Lung cancer screening. You may have this screening every year starting at age 80 if you have a 30-pack-year history of smoking and currently smoke or have quit within the past 15 years.  Colorectal cancer screening. All adults should have this screening starting at age 14 and continuing until age 63. Your health care provider may recommend screening at age 70 if you are at increased risk. You will have tests every 1-10 years, depending  on your results and the type of screening test.  Prostate cancer screening. Recommendations will vary depending on your family history and other risks.  Diabetes screening. This is done by checking your blood sugar (glucose) after you have not eaten for a while (fasting). You may have this done every 1-3 years.  Abdominal aortic aneurysm (AAA) screening. You may need this if you are a current or former smoker.  Sexually transmitted disease (STD) testing. Follow these instructions at home: Eating and drinking  Eat a diet that includes fresh fruits and vegetables, whole grains, lean protein, and low-fat dairy products. Limit your intake of foods with high amounts of sugar, saturated fats, and salt.  Take vitamin and mineral supplements as recommended by your health care provider.  Do not drink alcohol if your health care provider tells you not to drink.  If you drink alcohol: ? Limit how much you have to 0-2 drinks a day. ? Be aware of how much alcohol is in your drink. In the [place] one drink equals one 12 oz bottle of beer (355 mL), one 5 oz glass of wine (148 mL), or one 1 oz glass of hard liquor (44 mL). Lifestyle  Take daily care of your teeth and gums.  Stay active. Exercise for at least 30 minutes on 5 or more days each week.  Do not use any products that contain nicotine or tobacco, such as cigarettes, e-cigarettes, and chewing tobacco. If you need help quitting, ask your health care provider.  If you are sexually active, practice safe sex. Use a condom or other form of protection to prevent STIs (sexually transmitted infections).  Talk with your health care provider about taking a low-dose aspirin or statin. What's next?  Visit your health care provider once a year for a well check visit.  Ask your health care provider how often you should have your eyes and teeth checked.  Stay up to date on all vaccines. This information is not intended to replace advice given to you  by your health care provider. Make sure you discuss any questions you have with your health care provider. Document Revised: 03/08/2018 Document Reviewed: 03/08/2018 Elsevier Patient Education  2020 [employer].

## 2019-06-24 NOTE — Assessment & Plan Note (Signed)
Check B12 

## 2019-06-24 NOTE — Assessment & Plan Note (Signed)
Check PTH and calcium level today.

## 2019-06-24 NOTE — Assessment & Plan Note (Signed)
Continue Vytorin and fenofibrate at current doses.  Check lipid panel today.

## 2019-06-25 LAB — COMPREHENSIVE METABOLIC PANEL
ALT: 12 U/L (ref 0–53)
AST: 15 U/L (ref 0–37)
Albumin: 4.1 g/dL (ref 3.5–5.2)
Alkaline Phosphatase: 28 U/L — ABNORMAL LOW (ref 39–117)
BUN: 25 mg/dL — ABNORMAL HIGH (ref 6–23)
CO2: 27 mEq/L (ref 19–32)
Calcium: 9.9 mg/dL (ref 8.4–10.5)
Chloride: 102 mEq/L (ref 96–112)
Creatinine, Ser: 1.83 mg/dL — ABNORMAL HIGH (ref 0.40–1.50)
GFR: 44.62 mL/min — ABNORMAL LOW (ref >60.00)
Glucose, Bld: 123 mg/dL — ABNORMAL HIGH (ref 70–99)
Potassium: 4.5 mEq/L (ref 3.5–5.1)
Sodium: 138 mEq/L (ref 135–145)
Total Bilirubin: 0.7 mg/dL (ref 0.2–1.2)
Total Protein: 6.6 g/dL (ref 6.0–8.3)

## 2019-06-25 LAB — CBC
HCT: 30.9 % — ABNORMAL LOW (ref 39.0–52.0)
Hemoglobin: 10.6 g/dL — ABNORMAL LOW (ref 13.0–17.0)
MCHC: 34.2 g/dL (ref 30.0–36.0)
MCV: 104.1 fl — ABNORMAL HIGH (ref 78.0–100.0)
Platelets: 196 10*3/uL (ref 150.0–400.0)
RBC: 2.97 Mil/uL — ABNORMAL LOW (ref 4.22–5.81)
RDW: 13.8 % (ref 11.5–15.5)
WBC: 3.8 10*3/uL — ABNORMAL LOW (ref 4.0–10.5)

## 2019-06-25 LAB — PSA, MEDICARE: PSA: 0.17 ng/ml (ref 0.10–4.00)

## 2019-06-25 LAB — LIPID PANEL
Cholesterol: 211 mg/dL — ABNORMAL HIGH (ref 0–200)
HDL: 105.3 mg/dL (ref >39.00)
LDL Cholesterol: 85 mg/dL (ref 0–99)
NonHDL: 105.64
Total CHOL/HDL Ratio: 2
Triglycerides: 105 mg/dL (ref 0.0–149.0)
VLDL: 21 mg/dL (ref 0.0–40.0)

## 2019-06-25 LAB — HEMOGLOBIN A1C: Hgb A1c MFr Bld: 6.1 % (ref 4.6–6.5)

## 2019-06-25 LAB — IBC + FERRITIN
Ferritin: 1048.1 ng/mL — ABNORMAL HIGH (ref 22.0–322.0)
Iron: 151 ug/dL (ref 42–165)
Saturation Ratios: 42.6 % (ref 20.0–50.0)
Transferrin: 253 mg/dL (ref 212.0–360.0)

## 2019-06-25 LAB — VITAMIN B12: Vitamin B-12: 394 pg/mL (ref 211–911)

## 2019-06-25 LAB — MICROALBUMIN / CREATININE URINE RATIO
Creatinine,U: 130.2 mg/dL
Microalb Creat Ratio: 2.1 mg/g (ref 0.0–30.0)
Microalb, Ur: 2.7 mg/dL — ABNORMAL HIGH (ref 0.0–1.9)

## 2019-06-25 LAB — PTH, INTACT AND CALCIUM
Calcium: 10.2 mg/dL (ref 8.6–10.3)
PTH: 43 pg/mL (ref 14–64)

## 2019-06-25 LAB — TSH: TSH: 1.52 u[IU]/mL (ref 0.35–4.50)

## 2019-06-25 NOTE — Progress Notes (Signed)
Please inform patient of the following:  Blood work all stable.  Sugar is up slightly but his cholesterol levels look much better.  Do not need to make any adjustments to his treatment plan at this time. Would like for him to keep up the good work and we can recheck in a year or so.

## 2019-07-04 ENCOUNTER — Ambulatory Visit (INDEPENDENT_AMBULATORY_CARE_PROVIDER_SITE_OTHER): Payer: Medicare PPO

## 2019-07-04 ENCOUNTER — Other Ambulatory Visit: Payer: Self-pay

## 2019-07-04 ENCOUNTER — Encounter: Payer: Self-pay | Admitting: Family Medicine

## 2019-07-04 ENCOUNTER — Ambulatory Visit: Payer: Self-pay

## 2019-07-04 ENCOUNTER — Ambulatory Visit: Payer: Medicare PPO | Admitting: Family Medicine

## 2019-07-04 VITALS — BP 120/68 | HR 85 | Ht 74.0 in | Wt 267.8 lb

## 2019-07-04 DIAGNOSIS — M25512 Pain in left shoulder: Secondary | ICD-10-CM

## 2019-07-04 DIAGNOSIS — M19012 Primary osteoarthritis, left shoulder: Secondary | ICD-10-CM | POA: Diagnosis not present

## 2019-07-04 NOTE — Patient Instructions (Signed)
Thank you for coming in today. Attend PT.  Use the over the counter Voltaren gel on the top part of the shoulder up to 4x daily for pain as needed.  Recheck in 4-6 weeks.  You should hear from my office tomorrow or Monday about Xray results.    Rotator Cuff Tear  A rotator cuff tear is a partial or complete tear of the cord-like bands (tendons) that connect muscle to bone in the rotator cuff. The rotator cuff is a group of muscles and tendons that surround the shoulder joint and keep the upper arm bone (humerus) in the shoulder socket. The tear can occur suddenly (acute tear) or can develop over a long period of time (chronic tear). What are the causes? Acute tears may be caused by:  A fall, especially on an outstretched arm.  Lifting very heavy objects with a jerking motion. Chronic tears may be caused by overuse of the muscles. This may happen in sports, physical work, or activities in which your arm repeatedly moves over your head. What increases the risk? This condition is more likely to occur in:  Athletes and workers who frequently use their shoulder or reach over their heads. This may include activities such as: ? Tennis. ? Baseball and softball. ? Swimming and rowing. ? Weightlifting. ? Architect work. ? Painting.  People who smoke.  Older people who have arthritis or poor blood supply. These can make the muscles and tendons weaker. What are the signs or symptoms? Symptoms of this condition depend on the type and severity of the injury:  An acute tear may include a sudden tearing feeling, followed by severe pain that goes from your upper shoulder, down your arm, and toward your elbow.  A chronic tear includes a gradual weakness and decreased shoulder motion as the pain gets worse. The pain is usually worse at night. Both types may have symptoms such as:  Pain that spreads (radiates) from the shoulder to the upper arm.  Swelling and tenderness in front of the  shoulder.  Decreased range of motion.  Pain when: ? Reaching, pulling, or lifting the arm above the head. ? Lowering the arm from above the head.  Not being able to raise your arm out to the side.  Difficulty placing the arm behind your back. How is this diagnosed? This condition is diagnosed with a medical history and physical exam. Imaging tests may also be done, including:  X-rays.  MRI.  Ultrasound.  CT or MR arthrogram. During this test, a contrast material is injected into your shoulder and then images are taken. How is this treated? Treatment for this condition depends on the type and severity of the condition. In less severe cases, treatment may include:  Rest. This may be done with a sling that holds the shoulder still (immobilization). Your health care provider may also recommend avoiding activities that involve lifting your arm over your head.  Icing the shoulder.  Anti-inflammatory medicines, such as aspirin or ibuprofen.  Strengthening and stretching exercises. Your health care provider may recommend specific exercises to improve your range of motion and strengthen your shoulder. In more severe cases, treatment may include:  Physical therapy.  Steroid injections.  Surgery. Follow these instructions at home: Managing pain, stiffness, and swelling  If directed, put ice on the injured area. ? If you have a removable sling, remove it as told by your health care provider. ? Put ice in a plastic bag. ? Place a towel between your skin and  the bag. ? Leave the ice on for 20 minutes, 2-3 times a day.  Raise (elevate) the injured area above the level of your heart while you are lying down.  Find a comfortable sleeping position or sleep on a recliner, if available.  Move your fingers often to avoid stiffness and to lessen swelling.  Once the swelling has gone down, your health care provider may direct you to apply heat to relax the muscles. Use the heat source  that your health care provider recommends, such as a moist heat pack or a heating pad. ? Place a towel between your skin and the heat source. ? Leave the heat on for 20-30 minutes. ? Remove the heat if your skin turns bright red. This is especially important if you are unable to feel pain, heat, or cold. You may have a greater risk of getting burned. If you have a sling:  Wear the sling as told by your health care provider. Remove it only as told by your health care provider.  Loosen the sling if your fingers tingle, become numb, or turn cold and blue.  Keep the sling clean.  If the sling is not waterproof: ? Do not let it get wet. ? Cover it with a watertight covering when you take a bath or a shower. Driving  Do not drive or use heavy machinery while taking prescription pain medicine.  Ask your health care provider when it is safe to drive if you have a sling on your arm. Activity  Rest your shoulder as told by your health care provider.  Return to your normal activities as told by your health care provider. Ask your health care provider what activities are safe for you.  Do any exercises or stretches as told by your health care provider. General instructions  Do not use any products that contain nicotine or tobacco, such as cigarettes and e-cigarettes. If you need help quitting, ask your health care provider.  Take over-the-counter and prescription medicines only as told by your health care provider.  Keep all follow-up visits as told by your health care provider. This is important. Contact a health care provider if:  Your pain gets worse.  You have new pain in your arm, hands, or fingers.  Medicine does not help your pain. Get help right away if:  Your arm, hand, or fingers are numb or tingling.  Your arm, hand, or fingers are swollen or painful or they turn white or blue.  Your hand or fingers on your injured arm are colder than your other hand. Summary  A  rotator cuff tear is a partial or complete tear of the cord-like bands (tendons) that connect muscle to bone in the rotator cuff.  The tear can occur suddenly (acute tear) or can develop over a long period of time (chronic tear).  Treatment generally includes rest, anti-inflammatory medicines, and icing. In some cases, physical therapy and steroid injections may be needed. In severe cases, surgery may be needed. This information is not intended to replace advice given to you by your health care provider. Make sure you discuss any questions you have with your health care provider. Document Revised: 02/24/2017 Document Reviewed: 05/30/2016 Elsevier Patient Education  [place].

## 2019-07-04 NOTE — Progress Notes (Signed)
Subjective:    CC: L shoulder pain  I, Mitchell Alvarado, LAT, ATC, am serving as scribe for Dr. Lynne Leader.  HPI: Pt is a 69 y/o male presenting w/ c/o L shoulder pain x 6 weeks after a fall down his stairs when his L shoulder hit the railing.  He locates his pain to his L lateral shoulder/upper arm.  He rates his pain as moderate and describes his pain as dull and aching.  Radiating pain: No Neck pain: No L shoulder mechanical symptoms: Intermittent Aggravating factors: L shoulder abduction/reaching Treatments tried: heat  Pertinent review of Systems: No fevers or chills  Relevant historical information: Hypertension and diabetes, CKD 3, hyperparathyroidism   Objective:    Vitals:   07/04/19 1451  BP: 120/68  Pulse: 85  SpO2: 97%   General: Well Developed, well nourished, and in no acute distress.   MSK: C-spine: Normal-appearing nontender normal motion. Left shoulder normal-appearing nontender. Normal shoulder motion with full abduction external and internal rotation. Strength intact abduction external/internal rotation.  Resisted external rotation does reproduce pain a bit. Negative Hawkins and Neer's test.  Mildly positive empty can test. Negative Yergason's and speeds test.  Right shoulder normal-appearing nontender normal motion normal strength negative impingement testing.  Lab and Radiology Results  X-ray images left shoulder obtained today personally and independently reviewed No severe DJD or fractures.  Slight avulsion fragment seen at posterior lateral humeral head consistent with partial avulsion Await formal radiology review  Diagnostic Limited MSK Ultrasound of: Left shoulder Biceps tendon not fully visualized due to body habitus. Subscapularis tendon normal. Supraspinatus tendon intact normal-appearing slight increased subacromial bursa thickness. Infraspinatus tendon intact.  Hyperechoic changes at insertion of tendon onto humerus indicating  calcific tendinopathy AC joint degenerative mild effusion. Impression: Rotator cuff tendinopathy possible partial tear infraspinatus tendon    Impression and Recommendations:    Assessment and Plan: 69 y.o. male with left shoulder pain ongoing for about a month after a fall.  Patient fortunately has great strength and great motion today.  His symptoms are more milder.  Ultrasound examination shows evidence of injury and irritation at infraspinatus tendon insertion.  This could be a healing partial tear or chronic calcific tendinopathy.  Plan for trial of physical therapy and Voltaren gel.  Avoid oral NSAIDs due to CKD 3 with diabetes.  Recheck back in 6 weeks.  Return to contact me sooner if not doing well.Marland Kitchen  PDMP not reviewed this encounter. Orders Placed This Encounter  Procedures  . [place] LIMITED JOINT SPACE STRUCTURES LOW LEFT(NO LINKED CHARGES)    Order Specific Question:   Reason for Exam (SYMPTOM  OR DIAGNOSIS REQUIRED)    Answer:   L shoulder pain    Order Specific Question:   Preferred imaging location?    Answer:   [place]  . DG Shoulder Left    Standing Status:   Future    Standing Expiration Date:   09/02/2020    Order Specific Question:   Reason for Exam (SYMPTOM  OR DIAGNOSIS REQUIRED)    Answer:   eval left shoulder pain after fall. Suspect RTC strain    Order Specific Question:   Preferred imaging location?    Answer:   [name]    Order Specific Question:   Radiology Contrast Protocol - do NOT remove file path    Answer:   [email or web address]  . Ambulatory referral to Physical Therapy    Referral Priority:   Routine  Referral Type:   Physical Medicine    Referral Reason:   Specialty Services Required    Requested Specialty:   Physical Therapy   No orders of the defined types were placed in this encounter.   Discussed warning signs or symptoms. Please see discharge instructions. Patient  expresses understanding.   The above documentation has been reviewed and is accurate and complete Lynne Leader

## 2019-07-05 NOTE — Progress Notes (Signed)
X-ray shoulder showed a little area of bone pulled up from the shoulder.  This I could see on ultrasound as well.  This is probably a healing old rotator cuff strain or tendinitis.Additionally radiologist see evidence of an old rib fracture.  This probably occurred during the fall.  I do not think this explains her arm pain.  If you have pain on the side of your rib near your arm that could be a source of pain.  Mild shoulder arthritis also present.

## 2019-07-10 ENCOUNTER — Encounter: Payer: Self-pay | Admitting: Physical Therapy

## 2019-07-10 ENCOUNTER — Other Ambulatory Visit: Payer: Self-pay

## 2019-07-10 ENCOUNTER — Ambulatory Visit (INDEPENDENT_AMBULATORY_CARE_PROVIDER_SITE_OTHER): Payer: Medicare PPO | Admitting: Physical Therapy

## 2019-07-10 DIAGNOSIS — M25512 Pain in left shoulder: Secondary | ICD-10-CM | POA: Diagnosis not present

## 2019-07-11 ENCOUNTER — Encounter: Payer: Self-pay | Admitting: Physical Therapy

## 2019-07-11 NOTE — Patient Instructions (Signed)
Access Code: [email or web address] URL: [email or web address] Date: 07/10/2019 Prepared by: Lyndee Hensen  Exercises Standing Backward Shoulder Rolls - 2 x daily - 1 sets - 10 reps Standing Scapular Retraction - 3 x daily - 1 sets - 10 reps

## 2019-07-11 NOTE — Therapy (Signed)
[facility] [address] [place], [place], [address] Phone: [phone]   Fax:  [phone]  Physical Therapy Evaluation  Patient Details  Name: Mitchell Alvarado MRN: [id number] Date of Birth: 1950/05/02 Referring Provider (PT): Lynne Leader   Encounter Date: 07/10/2019  PT End of Session - 07/11/19 1049    Visit Number  1    Number of Visits  12    Date for PT Re-Evaluation  08/21/19    Authorization Type  [employer]    PT Start Time  [id number]    PT Stop Time  1513    PT Time Calculation (min)  41 min    Activity Tolerance  Patient tolerated treatment well    Behavior During Therapy  [facility] for tasks assessed/performed       Past Medical History:  Diagnosis Date  . ANEMIA, IRON DEFICIENCY 12/23/2009  . Barrett's esophagus without dysplasia 05/2012  . DIABETES MELLITUS, TYPE II 12/30/2006  . ECZEMA 05/15/2009  . GERD 12/30/2006  . HYPERLIPIDEMIA 12/30/2006  . HYPERTENSION 12/30/2006  . HYPERTHYROIDISM 06/03/2008  . Pernicious anemia 12/30/2006  . Personal history of colonic polyps - adenoma 07/22/2004    Past Surgical History:  Procedure Laterality Date  . COLONOSCOPY  multiple  . ESOPHAGOGASTRODUODENOSCOPY  multiple  . TRANSURETHRAL RESECTION OF PROSTATE  2000    There were no vitals filed for this visit.   Subjective Assessment - 07/11/19 1047    Subjective  Pt states mild pain in L shoulder, then had fall onto stairs/railing about 6 weeks ago, increasing pain .  Pain with reaching out to side, across. No pain at rest.  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    Limitations  Lifting;House hold activities    Diagnostic tests  Recent US shows avlsion of post humerus/infraspinatus    Patient Stated Goals  decreased pain    Currently in Pain?  Yes    Pain Score  4     Pain Location  Shoulder    Pain Orientation  Left    Pain Descriptors / Indicators  Aching    Pain Type  Acute pain    Pain Onset  More than a month  ago    Pain Frequency  Intermittent    Aggravating Factors   reaching, lifting         [facility] PT Assessment - 07/11/19 0001      Assessment   Medical Diagnosis  L shoulder pain    Referring Provider (PT)  Lynne Leader    Hand Dominance  Right    Prior Therapy  no      Balance Screen   Has the patient fallen in the past 6 months  No      Prior Function   Level of Independence  Independent      Cognition   Overall Cognitive Status  Within Functional Limits for tasks assessed      Posture/Postural Control   Posture Comments  (flexible) rounded shoulders , poor seated posture       AROM   Overall AROM Comments  R flexion: 150:  L PROM: WNL     Left Shoulder Flexion  140 Degrees    Left Shoulder ABduction  140 Degrees    Left Shoulder Internal Rotation  --   wnl   Left Shoulder External Rotation  --   wnl     Strength   Left Shoulder Flexion  4/5    Left Shoulder ABduction  4/5    Left Shoulder Internal Rotation  4+/5    Left Shoulder External Rotation  4+/5      Palpation   Palpation comment  minimal pain to palpate GHJ, no pain in supra/infra spinatus or teres, most pain referred in deltoid region                 Objective measurements completed on examination: See above findings.      [facility] Adult PT Treatment/Exercise - 07/11/19 0001      Exercises   Exercises  Shoulder      Shoulder Exercises: Standing   Other Standing Exercises  Shoulder rolls x20:  Scap retract x20;       Modalities   Modalities  Iontophoresis      Iontophoresis   Type of Iontophoresis  Dexamethasone    Location  L posterior shoulder    Time  4 hr patch      Manual Therapy   Manual Therapy  Soft tissue mobilization    Soft tissue mobilization  DTM to L posterior/superior shoulder              PT Education - 07/11/19 1048    Education Details  PT POC, Exam findings, HEP for posture    Person(s) Educated  Patient    Methods  Explanation;Demonstration;Verbal  cues;Handout    Comprehension  Verbalized understanding;Returned demonstration;Verbal cues required;Tactile cues required;Need further instruction       PT Short Term Goals - 07/11/19 1051      PT SHORT TERM GOAL #1   Title  Pt to be independent with initial HEP    Time  2    Period  Weeks    Status  New    Target Date  07/24/19        PT Long Term Goals - 07/11/19 1051      PT LONG TERM GOAL #1   Title  Pt to be independent wtih final HEP    Time  6    Period  Weeks    Status  New    Target Date  08/21/19      PT LONG TERM GOAL #2   Title  Pt to report decreased pain in L shoulder to 0-1/10 with activity, reaching, and IADLs.    Time  6    Period  Weeks    Status  New    Target Date  08/21/19      PT LONG TERM GOAL #3   Title  Pt to demo ability to self correct posture at least 75 % of the time in clinic.    Time  6    Period  Weeks    Status  New    Target Date  08/21/19      PT LONG TERM GOAL #4   Title  Pt to demo improved strength of L shoulder girdle to 5/5    Time  6    Period  Weeks    Status  New    Target Date  08/21/19             Plan - 07/11/19 1053    Clinical Impression Statement  Pt presents with primary complaint of increased pain in L shoulder. Pt with only mild ROM limitation with AROM elevation. Pt with poor seated posture and poor mechanics with reaching, lifting. Pt with minimal pain to palpate shoulder and musculature, but most pain is referred into L humerus. Pt with decreased ability for full  functional activities, reaching, lifting, carrying, and iADLS. Pt to benefit from skilled PT to improve.    Examination-Activity Limitations  Reach Overhead;Carry;Sleep;Lift    Examination-Participation Restrictions  Meal Prep;Cleaning;Community Activity;Shop;Yard Work    Stability/Clinical Decision Making  Stable/Uncomplicated    Clinical Decision Making  Low    Rehab Potential  Good    PT Frequency  2x / week    PT Duration  6 weeks    PT  Treatment/Interventions  ADLs/Self Care Home Management;Cryotherapy;Electrical Stimulation;DME Instruction;Ultrasound;Traction;Moist Heat;Iontophoresis 4mg /ml Dexamethasone;Functional mobility training;Therapeutic activities;Therapeutic exercise;Neuromuscular re-education;Manual techniques;Patient/family education;Passive range of motion;Dry needling;Taping;Vasopneumatic Device;Joint Manipulations;Spinal Manipulations    Consulted and Agree with Plan of Care  Patient       Patient will benefit from skilled therapeutic intervention in order to improve the following deficits and impairments:  Decreased range of motion, Impaired UE functional use, Increased muscle spasms, Decreased activity tolerance, Pain, Improper body mechanics, Decreased mobility, Decreased strength  Visit Diagnosis: Acute pain of left shoulder     Problem List Patient Active Problem List   Diagnosis Date Noted  . Hyperparathyroidism, secondary ([place]) 05/27/2016  . CKD stage 3 secondary to diabetes ([place]) 05/21/2012  . Iron deficiency anemia 12/23/2009  . ECZEMA 05/15/2009  . Diabetes ([place]) 12/30/2006  . Dyslipidemia and hypertriglyceridemia associated with type 2 diabetes mellitus ([place]) 12/30/2006  . PERNICIOUS ANEMIA 12/30/2006  . Hypertension associated with diabetes ([place]) 12/30/2006  . GERD 12/30/2006  . Personal history of colonic polyps - adenoma 07/22/2004  . Barrett's esophagus -less than 1 cm, does not meet current criteria for this diagnosis 01/24/2002    Lyndee Hensen, PT, DPT 10:56 AM  07/11/19    [facility] [place], [place], [address] Phone: [phone]   Fax:  [phone]  Name: Mitchell Alvarado MRN: [id number] Date of Birth: Aug 02, 1950

## 2019-07-17 ENCOUNTER — Encounter: Payer: Self-pay | Admitting: Physical Therapy

## 2019-07-17 ENCOUNTER — Ambulatory Visit: Payer: Medicare PPO | Admitting: Physical Therapy

## 2019-07-17 ENCOUNTER — Other Ambulatory Visit: Payer: Self-pay

## 2019-07-17 DIAGNOSIS — M25512 Pain in left shoulder: Secondary | ICD-10-CM

## 2019-07-17 NOTE — Patient Instructions (Signed)
Access Code: [email or web address] URL: [email or web address] Date: 07/17/2019 Prepared by: Lyndee Hensen  Exercises Standing Backward Shoulder Rolls - 2 x daily - 1 sets - 10 reps Standing Scapular Retraction - 3 x daily - 1 sets - 10 reps Standing Shoulder Flexion Wall Walk - 1 x daily - 1 sets - 10 reps Supine Shoulder Flexion with Dowel - 2 x daily - 1 sets - 10 reps Shoulder Internal Rotation with Resistance - 1 x daily - 1-2 sets - 10 reps Shoulder External Rotation with Anchored Resistance - 1 x daily - 1-2 sets - 10 reps

## 2019-07-18 ENCOUNTER — Encounter: Payer: Self-pay | Admitting: Physical Therapy

## 2019-07-18 NOTE — Therapy (Signed)
[facility] [address] [place], [place], [address] Phone: [phone]   Fax:  [phone]  Physical Therapy Treatment  Patient Details  Name: Mitchell Alvarado MRN: [id number] Date of Birth: April 18, 1950 Referring Provider (PT): Lynne Leader   Encounter Date: 07/17/2019  PT End of Session - 07/18/19 1103    Visit Number  2    Number of Visits  12    Date for PT Re-Evaluation  08/21/19    Authorization Type  [employer]    PT Start Time  [id number]    PT Stop Time  1514    PT Time Calculation (min)  42 min    Activity Tolerance  Patient tolerated treatment well    Behavior During Therapy  [facility] for tasks assessed/performed       Past Medical History:  Diagnosis Date  . ANEMIA, IRON DEFICIENCY 12/23/2009  . Barrett's esophagus without dysplasia 05/2012  . DIABETES MELLITUS, TYPE II 12/30/2006  . ECZEMA 05/15/2009  . GERD 12/30/2006  . HYPERLIPIDEMIA 12/30/2006  . HYPERTENSION 12/30/2006  . HYPERTHYROIDISM 06/03/2008  . Pernicious anemia 12/30/2006  . Personal history of colonic polyps - adenoma 07/22/2004    Past Surgical History:  Procedure Laterality Date  . COLONOSCOPY  multiple  . ESOPHAGOGASTRODUODENOSCOPY  multiple  . TRANSURETHRAL RESECTION OF PROSTATE  2000    There were no vitals filed for this visit.  Subjective Assessment - 07/17/19 1438    Subjective  Pt states less pain in shoulder, using less pain meds as well. Still painful down arm with active movement.    Currently in Pain?  Yes    Pain Score  3     Pain Location  Shoulder    Pain Orientation  Left    Pain Descriptors / Indicators  Aching    Pain Type  Acute pain    Pain Onset  More than a month ago    Pain Frequency  Intermittent                       [facility] Adult PT Treatment/Exercise - 07/18/19 0001      Shoulder Exercises: Stretch   Other Shoulder Stretches  Upper trap stretch 30 sec x 3 bil;       Manual Therapy   Manual Therapy  Passive ROM    Soft tissue mobilization  DTM to L posterior/superior shoulder , and L UT    Passive ROM  PROm L shoulder all motions. LAD,                PT Short Term Goals - 07/11/19 1051      PT SHORT TERM GOAL #1   Title  Pt to be independent with initial HEP    Time  2    Period  Weeks    Status  New    Target Date  07/24/19        PT Long Term Goals - 07/11/19 1051      PT LONG TERM GOAL #1   Title  Pt to be independent wtih final HEP    Time  6    Period  Weeks    Status  New    Target Date  08/21/19      PT LONG TERM GOAL #2   Title  Pt to report decreased pain in L shoulder to 0-1/10 with activity, reaching, and IADLs.    Time  6    Period  Weeks  Status  New    Target Date  08/21/19      PT LONG TERM GOAL #3   Title  Pt to demo ability to self correct posture at least 75 % of the time in clinic.    Time  6    Period  Weeks    Status  New    Target Date  08/21/19      PT LONG TERM GOAL #4   Title  Pt to demo improved strength of L shoulder girdle to 5/5    Time  6    Period  Weeks    Status  New    Target Date  08/21/19            Plan - 07/18/19 1346    Clinical Impression Statement  Pt wtih minimal pain to palpate. No increased pain during session today. Discussed lifting, reaching mechanics for ADLs. and IADLs. Pt able to progress light strengthening today, plan to progress as tolerated.    Examination-Activity Limitations  Reach Overhead;Carry;Sleep;Lift    Examination-Participation Restrictions  Meal Prep;Cleaning;Community Activity;Shop;Yard Work    Stability/Clinical Decision Making  Stable/Uncomplicated    Rehab Potential  Good    PT Frequency  2x / week    PT Duration  6 weeks    PT Treatment/Interventions  ADLs/Self Care Home Management;Cryotherapy;Electrical Stimulation;DME Instruction;Ultrasound;Traction;Moist Heat;Iontophoresis 4mg /ml Dexamethasone;Functional mobility training;Therapeutic activities;Therapeutic exercise;Neuromuscular  re-education;Manual techniques;Patient/family education;Passive range of motion;Dry needling;Taping;Vasopneumatic Device;Joint Manipulations;Spinal Manipulations    Consulted and Agree with Plan of Care  Patient       Patient will benefit from skilled therapeutic intervention in order to improve the following deficits and impairments:  Decreased range of motion, Impaired UE functional use, Increased muscle spasms, Decreased activity tolerance, Pain, Improper body mechanics, Decreased mobility, Decreased strength  Visit Diagnosis: Acute pain of left shoulder     Problem List Patient Active Problem List   Diagnosis Date Noted  . Hyperparathyroidism, secondary ([place]) 05/27/2016  . CKD stage 3 secondary to diabetes ([place]) 05/21/2012  . Iron deficiency anemia 12/23/2009  . ECZEMA 05/15/2009  . Diabetes ([place]) 12/30/2006  . Dyslipidemia and hypertriglyceridemia associated with type 2 diabetes mellitus ([place]) 12/30/2006  . PERNICIOUS ANEMIA 12/30/2006  . Hypertension associated with diabetes ([place]) 12/30/2006  . GERD 12/30/2006  . Personal history of colonic polyps - adenoma 07/22/2004  . Barrett's esophagus -less than 1 cm, does not meet current criteria for this diagnosis 01/24/2002   Lyndee Hensen, PT, DPT 4:22 PM  07/18/19    [facility] [place], [place], [address] Phone: [phone]   Fax:  [phone]  Name: Mitchell Alvarado MRN: [id number] Date of Birth: July 12, 1950

## 2019-07-19 ENCOUNTER — Other Ambulatory Visit: Payer: Self-pay

## 2019-07-19 ENCOUNTER — Ambulatory Visit: Payer: Medicare PPO | Admitting: Physical Therapy

## 2019-07-19 ENCOUNTER — Encounter: Payer: Self-pay | Admitting: Physical Therapy

## 2019-07-19 DIAGNOSIS — M25512 Pain in left shoulder: Secondary | ICD-10-CM | POA: Diagnosis not present

## 2019-07-19 NOTE — Therapy (Signed)
[facility] [address] [place], [place], [address] Phone: [phone]   Fax:  [phone]  Physical Therapy Treatment  Patient Details  Name: Mitchell Alvarado MRN: [id number] Date of Birth: November 08, 1950 Referring Provider (PT): Lynne Leader   Encounter Date: 07/19/2019  PT End of Session - 07/19/19 1436    Visit Number  3    Number of Visits  12    Date for PT Re-Evaluation  08/21/19    Authorization Type  [employer]    PT Start Time  [id number]    PT Stop Time  1508    PT Time Calculation (min)  35 min    Activity Tolerance  Patient tolerated treatment well    Behavior During Therapy  [facility] for tasks assessed/performed       Past Medical History:  Diagnosis Date  . ANEMIA, IRON DEFICIENCY 12/23/2009  . Barrett's esophagus without dysplasia 05/2012  . DIABETES MELLITUS, TYPE II 12/30/2006  . ECZEMA 05/15/2009  . GERD 12/30/2006  . HYPERLIPIDEMIA 12/30/2006  . HYPERTENSION 12/30/2006  . HYPERTHYROIDISM 06/03/2008  . Pernicious anemia 12/30/2006  . Personal history of colonic polyps - adenoma 07/22/2004    Past Surgical History:  Procedure Laterality Date  . COLONOSCOPY  multiple  . ESOPHAGOGASTRODUODENOSCOPY  multiple  . TRANSURETHRAL RESECTION OF PROSTATE  2000    There were no vitals filed for this visit.  Subjective Assessment - 07/19/19 1436    Subjective  Pt states mild soreness in shoulder.    Currently in Pain?  Yes    Pain Score  2     Pain Location  Shoulder    Pain Orientation  Left    Pain Descriptors / Indicators  Aching    Pain Type  Acute pain    Pain Onset  More than a month ago    Pain Frequency  Intermittent                       [facility] Adult PT Treatment/Exercise - 07/19/19 1437      Posture/Postural Control   Posture Comments  (flexible) rounded shoulders , poor seated posture       Exercises   Exercises  Shoulder      Shoulder Exercises: Supine   Protraction  20 reps    Protraction Weight  (lbs)  3    Protraction Limitations  SA press     Horizontal ABduction  15 reps;AROM    External Rotation  20 reps    External Rotation Weight (lbs)  3    Flexion  15 reps;AROM    Flexion Limitations  --      Shoulder Exercises: Seated   Retraction  --      Shoulder Exercises: Standing   External Rotation  15 reps    Theraband Level (Shoulder External Rotation)  Level 2 (Red)    Internal Rotation  15 reps    Theraband Level (Shoulder Internal Rotation)  Level 2 (Red)    Row  20 reps    Theraband Level (Shoulder Row)  Level 2 (Red)    Other Standing Exercises  --    Other Standing Exercises  --      Shoulder Exercises: Pulleys   Flexion  2 minutes    ABduction  1 minute      Shoulder Exercises: Stretch   Other Shoulder Stretches  Upper trap stretch 30 sec x 3 bil;       Modalities   Modalities  Iontophoresis      Iontophoresis   Type of Iontophoresis  --    Location  --    Time  --      Manual Therapy   Manual Therapy  Soft tissue mobilization    Soft tissue mobilization  DTM to L posterior shoulder  and UT    Passive ROM  PROm L shoulder all motions. LAD,                PT Short Term Goals - 07/11/19 1051      PT SHORT TERM GOAL #1   Title  Pt to be independent with initial HEP    Time  2    Period  Weeks    Status  New    Target Date  07/24/19        PT Long Term Goals - 07/11/19 1051      PT LONG TERM GOAL #1   Title  Pt to be independent wtih final HEP    Time  6    Period  Weeks    Status  New    Target Date  08/21/19      PT LONG TERM GOAL #2   Title  Pt to report decreased pain in L shoulder to 0-1/10 with activity, reaching, and IADLs.    Time  6    Period  Weeks    Status  New    Target Date  08/21/19      PT LONG TERM GOAL #3   Title  Pt to demo ability to self correct posture at least 75 % of the time in clinic.    Time  6    Period  Weeks    Status  New    Target Date  08/21/19      PT LONG TERM GOAL #4   Title  Pt to  demo improved strength of L shoulder girdle to 5/5    Time  6    Period  Weeks    Status  New    Target Date  08/21/19            Plan - 07/19/19 1520    Clinical Impression Statement  Pt able to progress light strengthening, minimal pain during session. He does have difficulty and weakness noted with repeated elevation.    Examination-Activity Limitations  Reach Overhead;Carry;Sleep;Lift    Examination-Participation Restrictions  Meal Prep;Cleaning;Community Activity;Shop;Yard Work    Stability/Clinical Decision Making  Stable/Uncomplicated    Rehab Potential  Good    PT Frequency  2x / week    PT Duration  6 weeks    PT Treatment/Interventions  ADLs/Self Care Home Management;Cryotherapy;Electrical Stimulation;DME Instruction;Ultrasound;Traction;Moist Heat;Iontophoresis 4mg /ml Dexamethasone;Functional mobility training;Therapeutic activities;Therapeutic exercise;Neuromuscular re-education;Manual techniques;Patient/family education;Passive range of motion;Dry needling;Taping;Vasopneumatic Device;Joint Manipulations;Spinal Manipulations    Consulted and Agree with Plan of Care  Patient       Patient will benefit from skilled therapeutic intervention in order to improve the following deficits and impairments:  Decreased range of motion, Impaired UE functional use, Increased muscle spasms, Decreased activity tolerance, Pain, Improper body mechanics, Decreased mobility, Decreased strength  Visit Diagnosis: Acute pain of left shoulder     Problem List Patient Active Problem List   Diagnosis Date Noted  . Hyperparathyroidism, secondary ([place]) 05/27/2016  . CKD stage 3 secondary to diabetes ([place]) 05/21/2012  . Iron deficiency anemia 12/23/2009  . ECZEMA 05/15/2009  . Diabetes ([place]) 12/30/2006  . Dyslipidemia and hypertriglyceridemia associated with type 2 diabetes mellitus (  [place]) 12/30/2006  . PERNICIOUS ANEMIA 12/30/2006  . Hypertension associated with diabetes ([place]) 12/30/2006   . GERD 12/30/2006  . Personal history of colonic polyps - adenoma 07/22/2004  . Barrett's esophagus -less than 1 cm, does not meet current criteria for this diagnosis 01/24/2002    Lyndee Hensen, PT, DPT 3:21 PM  07/19/19    [facility] [place], [place], [address] Phone: [phone]   Fax:  [phone]  Name: Mitchell Alvarado MRN: [id number] Date of Birth: [birth year]

## 2019-07-23 ENCOUNTER — Other Ambulatory Visit: Payer: Self-pay

## 2019-07-23 ENCOUNTER — Encounter: Payer: Self-pay | Admitting: Physical Therapy

## 2019-07-23 ENCOUNTER — Ambulatory Visit: Payer: Medicare PPO | Admitting: Physical Therapy

## 2019-07-23 DIAGNOSIS — M25512 Pain in left shoulder: Secondary | ICD-10-CM

## 2019-07-23 NOTE — Therapy (Signed)
[facility] [address] [place], [place], [address] Phone: [phone]   Fax:  [phone]  Physical Therapy Treatment  Patient Details  Name: Mitchell Alvarado MRN: [id number] Date of Birth: 1951-01-06 Referring Provider (PT): Lynne Leader   Encounter Date: 07/23/2019  PT End of Session - 07/23/19 1435    Visit Number  4    Number of Visits  12    Date for PT Re-Evaluation  08/21/19    Authorization Type  [employer]    PT Start Time  1430    PT Stop Time  1509    PT Time Calculation (min)  39 min    Activity Tolerance  Patient tolerated treatment well    Behavior During Therapy  [facility] for tasks assessed/performed       Past Medical History:  Diagnosis Date  . ANEMIA, IRON DEFICIENCY 12/23/2009  . Barrett's esophagus without dysplasia 05/2012  . DIABETES MELLITUS, TYPE II 12/30/2006  . ECZEMA 05/15/2009  . GERD 12/30/2006  . HYPERLIPIDEMIA 12/30/2006  . HYPERTENSION 12/30/2006  . HYPERTHYROIDISM 06/03/2008  . Pernicious anemia 12/30/2006  . Personal history of colonic polyps - adenoma 07/22/2004    Past Surgical History:  Procedure Laterality Date  . COLONOSCOPY  multiple  . ESOPHAGOGASTRODUODENOSCOPY  multiple  . TRANSURETHRAL RESECTION OF PROSTATE  2000    There were no vitals filed for this visit.  Subjective Assessment - 07/23/19 1435    Subjective  Pt states improving pain. Notes most pain in AM, feeling better during the day.    Currently in Pain?  Yes    Pain Score  2     Pain Location  Shoulder    Pain Orientation  Left    Pain Descriptors / Indicators  Aching    Pain Type  Acute pain    Pain Onset  More than a month ago    Pain Frequency  Intermittent                       [facility] Adult PT Treatment/Exercise - 07/23/19 1427      Posture/Postural Control   Posture Comments  (flexible) rounded shoulders , poor seated posture       Exercises   Exercises  Shoulder      Shoulder Exercises: Supine   Protraction  --    Protraction Weight (lbs)  --    Protraction Limitations  --    Horizontal ABduction  15 reps;AROM    External Rotation  20 reps    External Rotation Weight (lbs)  3    Flexion  AROM;20 reps      Shoulder Exercises: Standing   External Rotation  15 reps    Theraband Level (Shoulder External Rotation)  Level 2 (Red)    Internal Rotation  15 reps    Theraband Level (Shoulder Internal Rotation)  Level 2 (Red)    Row  20 reps    Theraband Level (Shoulder Row)  Level 3 (Green)    Other Standing Exercises  Wall push ups x15    Other Standing Exercises  Abd to 90 deg x10, Scaption (full ) x10       Shoulder Exercises: Pulleys   Flexion  2 minutes    ABduction  --      Shoulder Exercises: Stretch   Other Shoulder Stretches  --    Other Shoulder Stretches  Seated LAQ x10 bil; Supine SLR 2x5 bil; Standing heel raises x15 : Standing march  x20; (given for HEP)      Modalities   Modalities  Iontophoresis      Manual Therapy   Manual Therapy  Soft tissue mobilization    Soft tissue mobilization  DTM to L posterior shoulder , IASTM to deltoid    Passive ROM  PROm L shoulder all motions. LAD,                PT Short Term Goals - 07/11/19 1051      PT SHORT TERM GOAL #1   Title  Pt to be independent with initial HEP    Time  2    Period  Weeks    Status  New    Target Date  07/24/19        PT Long Term Goals - 07/11/19 1051      PT LONG TERM GOAL #1   Title  Pt to be independent wtih final HEP    Time  6    Period  Weeks    Status  New    Target Date  08/21/19      PT LONG TERM GOAL #2   Title  Pt to report decreased pain in L shoulder to 0-1/10 with activity, reaching, and IADLs.    Time  6    Period  Weeks    Status  New    Target Date  08/21/19      PT LONG TERM GOAL #3   Title  Pt to demo ability to self correct posture at least 75 % of the time in clinic.    Time  6    Period  Weeks    Status  New    Target Date  08/21/19      PT  LONG TERM GOAL #4   Title  Pt to demo improved strength of L shoulder girdle to 5/5    Time  6    Period  Weeks    Status  New    Target Date  08/21/19            Plan - 07/23/19 1514    Clinical Impression Statement  Pt with much difficulty with repeated motions for elevation, due to weakness. THer ex for UEs progressed today. Minimal pain with actvity. Pt states weakness in legs, and decreased balance, pt given HEP for LE strength today, ther ex reviewed for safety.    Examination-Activity Limitations  Reach Overhead;Carry;Sleep;Lift    Examination-Participation Restrictions  Meal Prep;Cleaning;Community Activity;Shop;Yard Work    Stability/Clinical Decision Making  Stable/Uncomplicated    Rehab Potential  Good    PT Frequency  2x / week    PT Duration  6 weeks    PT Treatment/Interventions  ADLs/Self Care Home Management;Cryotherapy;Electrical Stimulation;DME Instruction;Ultrasound;Traction;Moist Heat;Iontophoresis 4mg /ml Dexamethasone;Functional mobility training;Therapeutic activities;Therapeutic exercise;Neuromuscular re-education;Manual techniques;Patient/family education;Passive range of motion;Dry needling;Taping;Vasopneumatic Device;Joint Manipulations;Spinal Manipulations    Consulted and Agree with Plan of Care  Patient       Patient will benefit from skilled therapeutic intervention in order to improve the following deficits and impairments:  Decreased range of motion, Impaired UE functional use, Increased muscle spasms, Decreased activity tolerance, Pain, Improper body mechanics, Decreased mobility, Decreased strength  Visit Diagnosis: Acute pain of left shoulder     Problem List Patient Active Problem List   Diagnosis Date Noted  . Hyperparathyroidism, secondary ([place]) 05/27/2016  . CKD stage 3 secondary to diabetes ([place]) 05/21/2012  . Iron deficiency anemia 12/23/2009  . ECZEMA 05/15/2009  . Diabetes ([place])  12/30/2006  . Dyslipidemia and hypertriglyceridemia  associated with type 2 diabetes mellitus ([place]) 12/30/2006  . PERNICIOUS ANEMIA 12/30/2006  . Hypertension associated with diabetes ([place]) 12/30/2006  . GERD 12/30/2006  . Personal history of colonic polyps - adenoma 07/22/2004  . Barrett's esophagus -less than 1 cm, does not meet current criteria for this diagnosis 01/24/2002    Lyndee Hensen, PT, DPT 3:16 PM  07/23/19    [facility] [place], [place], [address] Phone: [phone]   Fax:  [phone]  Name: Mitchell Alvarado MRN: [id number] Date of Birth: 02/10/1951

## 2019-07-25 ENCOUNTER — Other Ambulatory Visit: Payer: Self-pay

## 2019-07-25 ENCOUNTER — Encounter: Payer: Self-pay | Admitting: Physical Therapy

## 2019-07-25 ENCOUNTER — Ambulatory Visit: Payer: Medicare PPO | Admitting: Physical Therapy

## 2019-07-25 DIAGNOSIS — M25512 Pain in left shoulder: Secondary | ICD-10-CM

## 2019-07-25 NOTE — Therapy (Signed)
[facility] [address] [place], [place], [address] Phone: [phone]   Fax:  [phone]  Physical Therapy Treatment  Patient Details  Name: Mitchell Alvarado MRN: [id number] Date of Birth: 24-Jul-1950 Referring Provider (PT): Lynne Leader   Encounter Date: 07/25/2019  PT End of Session - 07/25/19 1443    Visit Number  5    Number of Visits  12    Date for PT Re-Evaluation  08/21/19    Authorization Type  [employer]    PT Start Time  1435    PT Stop Time  1509    PT Time Calculation (min)  34 min    Activity Tolerance  Patient tolerated treatment well    Behavior During Therapy  [facility] for tasks assessed/performed       Past Medical History:  Diagnosis Date  . ANEMIA, IRON DEFICIENCY 12/23/2009  . Barrett's esophagus without dysplasia 05/2012  . DIABETES MELLITUS, TYPE II 12/30/2006  . ECZEMA 05/15/2009  . GERD 12/30/2006  . HYPERLIPIDEMIA 12/30/2006  . HYPERTENSION 12/30/2006  . HYPERTHYROIDISM 06/03/2008  . Pernicious anemia 12/30/2006  . Personal history of colonic polyps - adenoma 07/22/2004    Past Surgical History:  Procedure Laterality Date  . COLONOSCOPY  multiple  . ESOPHAGOGASTRODUODENOSCOPY  multiple  . TRANSURETHRAL RESECTION OF PROSTATE  2000    There were no vitals filed for this visit.  Subjective Assessment - 07/25/19 1439    Subjective  Pt states minimal pain today, did have pain after last session, and in AM yesterday. OVerall doing much better.    Currently in Pain?  Yes    Pain Score  2     Pain Location  Shoulder    Pain Orientation  Left    Pain Descriptors / Indicators  Aching    Pain Type  Acute pain    Pain Onset  More than a month ago    Pain Frequency  Intermittent                       [facility] Adult PT Treatment/Exercise - 07/25/19 1443      Posture/Postural Control   Posture Comments  (flexible) rounded shoulders , poor seated posture       Exercises   Exercises  Shoulder      Shoulder Exercises: Supine   Protraction  20 reps    Protraction Weight (lbs)  3    Protraction Limitations  SA press     Horizontal ABduction  15 reps;AROM    External Rotation  20 reps    External Rotation Weight (lbs)  3    Flexion  AROM;15 reps      Shoulder Exercises: Standing   External Rotation  15 reps    Theraband Level (Shoulder External Rotation)  Level 2 (Red)    Internal Rotation  15 reps    Theraband Level (Shoulder Internal Rotation)  Level 2 (Red)    Row  20 reps    Theraband Level (Shoulder Row)  Level 3 (Green)    Other Standing Exercises  Wall push ups x15, Wall slides at door x15 on L (flexion ) ;     Other Standing Exercises  Abd to 90 deg x10, Scaption (full ) x10       Shoulder Exercises: Pulleys   Flexion  3 minutes      Shoulder Exercises: Stretch   Other Shoulder Stretches  --      Modalities   Modalities  Iontophoresis      Manual Therapy   Manual Therapy  Soft tissue mobilization    Soft tissue mobilization  --    Passive ROM  --               PT Short Term Goals - 07/25/19 1443      PT SHORT TERM GOAL #1   Title  Pt to be independent with initial HEP    Time  2    Period  Weeks    Status  Achieved    Target Date  07/24/19        PT Long Term Goals - 07/11/19 1051      PT LONG TERM GOAL #1   Title  Pt to be independent wtih final HEP    Time  6    Period  Weeks    Status  New    Target Date  08/21/19      PT LONG TERM GOAL #2   Title  Pt to report decreased pain in L shoulder to 0-1/10 with activity, reaching, and IADLs.    Time  6    Period  Weeks    Status  New    Target Date  08/21/19      PT LONG TERM GOAL #3   Title  Pt to demo ability to self correct posture at least 75 % of the time in clinic.    Time  6    Period  Weeks    Status  New    Target Date  08/21/19      PT LONG TERM GOAL #4   Title  Pt to demo improved strength of L shoulder girdle to 5/5    Time  6    Period  Weeks    Status  New     Target Date  08/21/19            Plan - 07/25/19 2139    Clinical Impression Statement  Pt with improving pain. He is doing well with strengthening in neutral positions. Pt wis very challenged wtih full shoulder AROM in standing, due to weakness. Also has difficulty with R shoulder. Difficult to assess if this was pts baselie. Req. max cuing to decrease compensation from rest of body when performing full shoulder elevation. Pt to benefit from continued strengthening.    Examination-Activity Limitations  Reach Overhead;Carry;Sleep;Lift    Examination-Participation Restrictions  Meal Prep;Cleaning;Community Activity;Shop;Yard Work    Stability/Clinical Decision Making  Stable/Uncomplicated    Rehab Potential  Good    PT Frequency  2x / week    PT Duration  6 weeks    PT Treatment/Interventions  ADLs/Self Care Home Management;Cryotherapy;Electrical Stimulation;DME Instruction;Ultrasound;Traction;Moist Heat;Iontophoresis 4mg /ml Dexamethasone;Functional mobility training;Therapeutic activities;Therapeutic exercise;Neuromuscular re-education;Manual techniques;Patient/family education;Passive range of motion;Dry needling;Taping;Vasopneumatic Device;Joint Manipulations;Spinal Manipulations    Consulted and Agree with Plan of Care  Patient       Patient will benefit from skilled therapeutic intervention in order to improve the following deficits and impairments:  Decreased range of motion, Impaired UE functional use, Increased muscle spasms, Decreased activity tolerance, Pain, Improper body mechanics, Decreased mobility, Decreased strength  Visit Diagnosis: Acute pain of left shoulder     Problem List Patient Active Problem List   Diagnosis Date Noted  . Hyperparathyroidism, secondary ([place]) 05/27/2016  . CKD stage 3 secondary to diabetes ([place]) 05/21/2012  . Iron deficiency anemia 12/23/2009  . ECZEMA 05/15/2009  . Diabetes ([place]) 12/30/2006  . Dyslipidemia and hypertriglyceridemia  associated with  type 2 diabetes mellitus ([place]) 12/30/2006  . PERNICIOUS ANEMIA 12/30/2006  . Hypertension associated with diabetes ([place]) 12/30/2006  . GERD 12/30/2006  . Personal history of colonic polyps - adenoma 07/22/2004  . Barrett's esophagus -less than 1 cm, does not meet current criteria for this diagnosis 01/24/2002    Lyndee Hensen, PT, DPT 9:41 PM  07/25/19    [facility] [place], [place], [address] Phone: [phone]   Fax:  [phone]  Name: Mitchell Alvarado MRN: [id number] Date of Birth: Jun 26, 1950

## 2019-07-30 ENCOUNTER — Encounter: Payer: Self-pay | Admitting: Physical Therapy

## 2019-07-30 ENCOUNTER — Other Ambulatory Visit: Payer: Self-pay

## 2019-07-30 ENCOUNTER — Ambulatory Visit: Payer: Medicare PPO | Admitting: Physical Therapy

## 2019-07-30 DIAGNOSIS — M25512 Pain in left shoulder: Secondary | ICD-10-CM

## 2019-07-31 NOTE — Therapy (Signed)
[facility] [address] [place], [place], [address] Phone: [phone]   Fax:  [phone]  Physical Therapy Treatment  Patient Details  Name: Mitchell Alvarado MRN: [id number] Date of Birth: [birth year] Referring Provider (PT): Lynne Leader   Encounter Date: 07/30/2019  PT End of Session - 07/30/19 1440    Visit Number  6    Number of Visits  12    Date for PT Re-Evaluation  08/21/19    Authorization Type  [employer]    PT Start Time  [id number]    PT Stop Time  1511    PT Time Calculation (min)  38 min    Activity Tolerance  Patient tolerated treatment well    Behavior During Therapy  [facility] for tasks assessed/performed       Past Medical History:  Diagnosis Date  . ANEMIA, IRON DEFICIENCY 12/23/2009  . Barrett's esophagus without dysplasia 05/2012  . DIABETES MELLITUS, TYPE II 12/30/2006  . ECZEMA 05/15/2009  . GERD 12/30/2006  . HYPERLIPIDEMIA 12/30/2006  . HYPERTENSION 12/30/2006  . HYPERTHYROIDISM 06/03/2008  . Pernicious anemia 12/30/2006  . Personal history of colonic polyps - adenoma 07/22/2004    Past Surgical History:  Procedure Laterality Date  . COLONOSCOPY  multiple  . ESOPHAGOGASTRODUODENOSCOPY  multiple  . TRANSURETHRAL RESECTION OF PROSTATE  2000    There were no vitals filed for this visit.  Subjective Assessment - 07/30/19 1440    Subjective  Pt states minimal pain. Only feels sore when he first wakes up in am. doing better with movements, reaching, etc, no pain.    Currently in Pain?  No/denies    Pain Score  0-No pain                       [facility] Adult PT Treatment/Exercise - 07/30/19 1451      Posture/Postural Control   Posture Comments  (flexible) rounded shoulders , poor seated posture       Exercises   Exercises  Shoulder      Shoulder Exercises: Supine   Protraction  --    Protraction Weight (lbs)  --    Protraction Limitations  --    Horizontal ABduction  AROM;20 reps    External Rotation   20 reps    External Rotation Weight (lbs)  3    Flexion  AROM;20 reps    Shoulder Flexion Weight (lbs)  2    Other Supine Exercises  Chest Press 3 lb x20;     Other Supine Exercises  manually resisted flexion from 90-180, x20;       Shoulder Exercises: Standing   External Rotation  20 reps    Theraband Level (Shoulder External Rotation)  Level 3 (Green)    Internal Rotation  20 reps    Theraband Level (Shoulder Internal Rotation)  Level 3 (Green)    Row  20 reps    Theraband Level (Shoulder Row)  Level 3 (Green)    Other Standing Exercises  Wall push ups x15,     Other Standing Exercises  Abd to 90 deg x10, Scaption (full ) x10 ; Flexion AAROm with cane x15;       Shoulder Exercises: Pulleys   Flexion  2 minutes    ABduction  1 minute      Modalities   Modalities  Iontophoresis      Manual Therapy   Manual Therapy  Soft tissue mobilization  PT Short Term Goals - 07/25/19 1443      PT SHORT TERM GOAL #1   Title  Pt to be independent with initial HEP    Time  2    Period  Weeks    Status  Achieved    Target Date  07/24/19        PT Long Term Goals - 07/11/19 1051      PT LONG TERM GOAL #1   Title  Pt to be independent wtih final HEP    Time  6    Period  Weeks    Status  New    Target Date  08/21/19      PT LONG TERM GOAL #2   Title  Pt to report decreased pain in L shoulder to 0-1/10 with activity, reaching, and IADLs.    Time  6    Period  Weeks    Status  New    Target Date  08/21/19      PT LONG TERM GOAL #3   Title  Pt to demo ability to self correct posture at least 75 % of the time in clinic.    Time  6    Period  Weeks    Status  New    Target Date  08/21/19      PT LONG TERM GOAL #4   Title  Pt to demo improved strength of L shoulder girdle to 5/5    Time  6    Period  Weeks    Status  New    Target Date  08/21/19            Plan - 07/31/19 0841    Clinical Impression Statement  Pt making good improvments with  pain. He does have weakness in L shoulder, with repeated motions for flexion and elevation. Plan to continue to focus on strengthening, and progress to d/c to HEP in next 1-2 weeks.    Examination-Activity Limitations  Reach Overhead;Carry;Sleep;Lift    Examination-Participation Restrictions  Meal Prep;Cleaning;Community Activity;Shop;Yard Work    Stability/Clinical Decision Making  Stable/Uncomplicated    Rehab Potential  Good    PT Frequency  2x / week    PT Duration  6 weeks    PT Treatment/Interventions  ADLs/Self Care Home Management;Cryotherapy;Electrical Stimulation;DME Instruction;Ultrasound;Traction;Moist Heat;Iontophoresis 4mg /ml Dexamethasone;Functional mobility training;Therapeutic activities;Therapeutic exercise;Neuromuscular re-education;Manual techniques;Patient/family education;Passive range of motion;Dry needling;Taping;Vasopneumatic Device;Joint Manipulations;Spinal Manipulations    Consulted and Agree with Plan of Care  Patient       Patient will benefit from skilled therapeutic intervention in order to improve the following deficits and impairments:  Decreased range of motion, Impaired UE functional use, Increased muscle spasms, Decreased activity tolerance, Pain, Improper body mechanics, Decreased mobility, Decreased strength  Visit Diagnosis: Acute pain of left shoulder     Problem List Patient Active Problem List   Diagnosis Date Noted  . Hyperparathyroidism, secondary ([place]) 05/27/2016  . CKD stage 3 secondary to diabetes ([place]) 05/21/2012  . Iron deficiency anemia 12/23/2009  . ECZEMA 05/15/2009  . Diabetes ([place]) 12/30/2006  . Dyslipidemia and hypertriglyceridemia associated with type 2 diabetes mellitus ([place]) 12/30/2006  . PERNICIOUS ANEMIA 12/30/2006  . Hypertension associated with diabetes ([place]) 12/30/2006  . GERD 12/30/2006  . Personal history of colonic polyps - adenoma 07/22/2004  . Barrett's esophagus -less than 1 cm, does not meet current criteria for  this diagnosis 01/24/2002    Lyndee Hensen, PT, DPT 8:43 AM  07/31/19    [facility]  [place], [place], [address] Phone: [phone]   Fax:  [phone]  Name: Mitchell Alvarado MRN: [id number] Date of Birth: September 30, 1950

## 2019-08-01 ENCOUNTER — Other Ambulatory Visit: Payer: Self-pay

## 2019-08-01 ENCOUNTER — Encounter: Payer: Self-pay | Admitting: Physical Therapy

## 2019-08-01 ENCOUNTER — Ambulatory Visit: Payer: Medicare PPO | Admitting: Physical Therapy

## 2019-08-01 DIAGNOSIS — M25512 Pain in left shoulder: Secondary | ICD-10-CM

## 2019-08-01 NOTE — Therapy (Signed)
[facility] [address] [place], [place], [address] Phone: [phone]   Fax:  [phone]  Physical Therapy Treatment  Patient Details  Name: Mitchell Alvarado MRN: [id number] Date of Birth: 06/04/1950 Referring Provider (PT): Lynne Leader   Encounter Date: 08/01/2019  PT End of Session - 08/01/19 1437    Visit Number  7    Number of Visits  12    Date for PT Re-Evaluation  08/21/19    Authorization Type  [employer]    PT Start Time  [id number]    PT Stop Time  1508    PT Time Calculation (min)  34 min    Activity Tolerance  Patient tolerated treatment well    Behavior During Therapy  [facility] for tasks assessed/performed       Past Medical History:  Diagnosis Date  . ANEMIA, IRON DEFICIENCY 12/23/2009  . Barrett's esophagus without dysplasia 05/2012  . DIABETES MELLITUS, TYPE II 12/30/2006  . ECZEMA 05/15/2009  . GERD 12/30/2006  . HYPERLIPIDEMIA 12/30/2006  . HYPERTENSION 12/30/2006  . HYPERTHYROIDISM 06/03/2008  . Pernicious anemia 12/30/2006  . Personal history of colonic polyps - adenoma 07/22/2004    Past Surgical History:  Procedure Laterality Date  . COLONOSCOPY  multiple  . ESOPHAGOGASTRODUODENOSCOPY  multiple  . TRANSURETHRAL RESECTION OF PROSTATE  2000    There were no vitals filed for this visit.  Subjective Assessment - 08/01/19 1436    Subjective  Pt with no new complaints. Minimal pain in shoulder.    Currently in Pain?  No/denies                       [facility] Adult PT Treatment/Exercise - 08/01/19 1442      Posture/Postural Control   Posture Comments  (flexible) rounded shoulders , poor seated posture       Exercises   Exercises  Shoulder      Shoulder Exercises: Supine   Horizontal ABduction  AROM;20 reps    External Rotation  20 reps    External Rotation Weight (lbs)  3    Flexion  AROM;20 reps    Shoulder Flexion Weight (lbs)  2    Other Supine Exercises  Chest Press 3 lb x20;     Other Supine  Exercises  manually resisted flexion from 90-180, x20;       Shoulder Exercises: Standing   External Rotation  20 reps    Theraband Level (Shoulder External Rotation)  Level 3 (Green)    Internal Rotation  20 reps    Theraband Level (Shoulder Internal Rotation)  Level 3 (Green)    Row  20 reps    Theraband Level (Shoulder Row)  Level 3 (Green)    Other Standing Exercises  Wall push ups x15,     Other Standing Exercises  Abd to 90 deg x10, Scaption (full ) 2 x10 ; Flexion AAROm with cane x15;       Shoulder Exercises: Pulleys   Flexion  --    ABduction  --      Modalities   Modalities  Iontophoresis      Manual Therapy   Manual Therapy  Soft tissue mobilization               PT Short Term Goals - 07/25/19 1443      PT SHORT TERM GOAL #1   Title  Pt to be independent with initial HEP    Time  2  Period  Weeks    Status  Achieved    Target Date  07/24/19        PT Long Term Goals - 07/11/19 1051      PT LONG TERM GOAL #1   Title  Pt to be independent wtih final HEP    Time  6    Period  Weeks    Status  New    Target Date  08/21/19      PT LONG TERM GOAL #2   Title  Pt to report decreased pain in L shoulder to 0-1/10 with activity, reaching, and IADLs.    Time  6    Period  Weeks    Status  New    Target Date  08/21/19      PT LONG TERM GOAL #3   Title  Pt to demo ability to self correct posture at least 75 % of the time in clinic.    Time  6    Period  Weeks    Status  New    Target Date  08/21/19      PT LONG TERM GOAL #4   Title  Pt to demo improved strength of L shoulder girdle to 5/5    Time  6    Period  Weeks    Status  New    Target Date  08/21/19            Plan - 08/01/19 1450    Clinical Impression Statement  Pt making good improvements with pain. Continues to have weakness with elevation on L, especially with repeated motions, increased compensation from body and UT seen after 1-2 reps. Plan to continue for 1 more week for  strengthening.    Examination-Activity Limitations  Reach Overhead;Carry;Sleep;Lift    Examination-Participation Restrictions  Meal Prep;Cleaning;Community Activity;Shop;Yard Work    Stability/Clinical Decision Making  Stable/Uncomplicated    Rehab Potential  Good    PT Frequency  2x / week    PT Duration  6 weeks    PT Treatment/Interventions  ADLs/Self Care Home Management;Cryotherapy;Electrical Stimulation;DME Instruction;Ultrasound;Traction;Moist Heat;Iontophoresis 4mg /ml Dexamethasone;Functional mobility training;Therapeutic activities;Therapeutic exercise;Neuromuscular re-education;Manual techniques;Patient/family education;Passive range of motion;Dry needling;Taping;Vasopneumatic Device;Joint Manipulations;Spinal Manipulations    Consulted and Agree with Plan of Care  Patient       Patient will benefit from skilled therapeutic intervention in order to improve the following deficits and impairments:  Decreased range of motion, Impaired UE functional use, Increased muscle spasms, Decreased activity tolerance, Pain, Improper body mechanics, Decreased mobility, Decreased strength  Visit Diagnosis: Acute pain of left shoulder     Problem List Patient Active Problem List   Diagnosis Date Noted  . Hyperparathyroidism, secondary ([place]) 05/27/2016  . CKD stage 3 secondary to diabetes ([place]) 05/21/2012  . Iron deficiency anemia 12/23/2009  . ECZEMA 05/15/2009  . Diabetes ([place]) 12/30/2006  . Dyslipidemia and hypertriglyceridemia associated with type 2 diabetes mellitus ([place]) 12/30/2006  . PERNICIOUS ANEMIA 12/30/2006  . Hypertension associated with diabetes ([place]) 12/30/2006  . GERD 12/30/2006  . Personal history of colonic polyps - adenoma 07/22/2004  . Barrett's esophagus -less than 1 cm, does not meet current criteria for this diagnosis 01/24/2002    Lyndee Hensen, PT, DPT 3:09 PM  08/01/19    [facility] [place],  [place], [address] Phone: [phone]   Fax:  [phone]  Name: Jiho Eckart Wurzer MRN: [id number] Date of Birth: [birth year]

## 2019-08-06 ENCOUNTER — Other Ambulatory Visit: Payer: Self-pay

## 2019-08-06 ENCOUNTER — Ambulatory Visit: Payer: Medicare PPO | Admitting: Physical Therapy

## 2019-08-06 ENCOUNTER — Encounter: Payer: Self-pay | Admitting: Physical Therapy

## 2019-08-06 DIAGNOSIS — M25512 Pain in left shoulder: Secondary | ICD-10-CM | POA: Diagnosis not present

## 2019-08-06 NOTE — Therapy (Signed)
[facility] [address] [place], [place], [address] Phone: [phone]   Fax:  [phone]  Physical Therapy Treatment  Patient Details  Name: Mitchell Alvarado MRN: [id number] Date of Birth: Aug 07, 1950 Referring Provider (PT): Lynne Leader   Encounter Date: 08/06/2019  PT End of Session - 08/06/19 1602    Visit Number  8    Number of Visits  12    Date for PT Re-Evaluation  08/21/19    Authorization Type  [employer]    PT Start Time  [id number]    PT Stop Time  1510    PT Time Calculation (min)  38 min    Activity Tolerance  Patient tolerated treatment well    Behavior During Therapy  [facility] for tasks assessed/performed       Past Medical History:  Diagnosis Date  . ANEMIA, IRON DEFICIENCY 12/23/2009  . Barrett's esophagus without dysplasia 05/2012  . DIABETES MELLITUS, TYPE II 12/30/2006  . ECZEMA 05/15/2009  . GERD 12/30/2006  . HYPERLIPIDEMIA 12/30/2006  . HYPERTENSION 12/30/2006  . HYPERTHYROIDISM 06/03/2008  . Pernicious anemia 12/30/2006  . Personal history of colonic polyps - adenoma 07/22/2004    Past Surgical History:  Procedure Laterality Date  . COLONOSCOPY  multiple  . ESOPHAGOGASTRODUODENOSCOPY  multiple  . TRANSURETHRAL RESECTION OF PROSTATE  2000    There were no vitals filed for this visit.  Subjective Assessment - 08/06/19 1602    Subjective  Pt reports no pain in shoulder, except for when doing repeated motions for flexion(with HEP)    Currently in Pain?  No/denies    Pain Score  0-No pain                       [facility] Adult PT Treatment/Exercise - 08/06/19 1455      Posture/Postural Control   Posture Comments  (flexible) rounded shoulders , poor seated posture       Exercises   Exercises  Shoulder      Shoulder Exercises: Supine   Horizontal ABduction  AROM;20 reps    Horizontal ABduction Weight (lbs)  2    External Rotation  20 reps    External Rotation Weight (lbs)  3    Flexion  AROM;20 reps     Shoulder Flexion Weight (lbs)  2    Other Supine Exercises  Chest Press 3 lb x20;     Other Supine Exercises  90/90 holds with perturbations;       Shoulder Exercises: Seated   Flexion  10 reps;AAROM    Flexion Limitations  assist from R hand       Shoulder Exercises: Standing   External Rotation  20 reps    Theraband Level (Shoulder External Rotation)  Level 3 (Green)    Internal Rotation  20 reps    Theraband Level (Shoulder Internal Rotation)  Level 3 (Green)    Row  20 reps    Theraband Level (Shoulder Row)  Level 3 (Green)    Other Standing Exercises  Wall push ups x15,     Other Standing Exercises  Abd to 90 deg x10, Scaption (full ) 2 x5;       Modalities   Modalities  Iontophoresis      Manual Therapy   Manual Therapy  Soft tissue mobilization             PT Education - 08/06/19 1510    Education Details  HEP updated    Person(s)  Educated  Patient    Methods  Explanation;Demonstration;Tactile cues;Verbal cues;Handout    Comprehension  Verbalized understanding;Returned demonstration;Verbal cues required;Tactile cues required       PT Short Term Goals - 07/25/19 1443      PT SHORT TERM GOAL #1   Title  Pt to be independent with initial HEP    Time  2    Period  Weeks    Status  Achieved    Target Date  07/24/19        PT Long Term Goals - 07/11/19 1051      PT LONG TERM GOAL #1   Title  Pt to be independent wtih final HEP    Time  6    Period  Weeks    Status  New    Target Date  08/21/19      PT LONG TERM GOAL #2   Title  Pt to report decreased pain in L shoulder to 0-1/10 with activity, reaching, and IADLs.    Time  6    Period  Weeks    Status  New    Target Date  08/21/19      PT LONG TERM GOAL #3   Title  Pt to demo ability to self correct posture at least 75 % of the time in clinic.    Time  6    Period  Weeks    Status  New    Target Date  08/21/19      PT LONG TERM GOAL #4   Title  Pt to demo improved strength of L shoulder  girdle to 5/5    Time  6    Period  Weeks    Status  New    Target Date  08/21/19            Plan - 08/06/19 1609    Clinical Impression Statement  Pt has made good progress with pain. Improving strength, still has weakness with full flexion and repeated motions. Plan to finalize HEP and d/c at next visit, discussed need to continue strengthening. Pt to f/u with MD next week.    Examination-Activity Limitations  Reach Overhead;Carry;Sleep;Lift    Examination-Participation Restrictions  Meal Prep;Cleaning;Community Activity;Shop;Yard Work    Stability/Clinical Decision Making  Stable/Uncomplicated    Rehab Potential  Good    PT Frequency  2x / week    PT Duration  6 weeks    PT Treatment/Interventions  ADLs/Self Care Home Management;Cryotherapy;Electrical Stimulation;DME Instruction;Ultrasound;Traction;Moist Heat;Iontophoresis 4mg /ml Dexamethasone;Functional mobility training;Therapeutic activities;Therapeutic exercise;Neuromuscular re-education;Manual techniques;Patient/family education;Passive range of motion;Dry needling;Taping;Vasopneumatic Device;Joint Manipulations;Spinal Manipulations    Consulted and Agree with Plan of Care  Patient       Patient will benefit from skilled therapeutic intervention in order to improve the following deficits and impairments:  Decreased range of motion, Impaired UE functional use, Increased muscle spasms, Decreased activity tolerance, Pain, Improper body mechanics, Decreased mobility, Decreased strength  Visit Diagnosis: Acute pain of left shoulder     Problem List Patient Active Problem List   Diagnosis Date Noted  . Hyperparathyroidism, secondary ([place]) 05/27/2016  . CKD stage 3 secondary to diabetes ([place]) 05/21/2012  . Iron deficiency anemia 12/23/2009  . ECZEMA 05/15/2009  . Diabetes ([place]) 12/30/2006  . Dyslipidemia and hypertriglyceridemia associated with type 2 diabetes mellitus ([place]) 12/30/2006  . PERNICIOUS ANEMIA 12/30/2006  .  Hypertension associated with diabetes ([place]) 12/30/2006  . GERD 12/30/2006  . Personal history of colonic polyps - adenoma 07/22/2004  . Barrett's esophagus -less  than 1 cm, does not meet current criteria for this diagnosis 01/24/2002    Lyndee Hensen, PT, DPT 4:10 PM  08/06/19    [facility] [place], [place], [address] Phone: [phone]   Fax:  [phone]  Name: Mitchell Alvarado MRN: [id number] Date of Birth: Jun 03, 1950

## 2019-08-06 NOTE — Patient Instructions (Signed)
Access Code: [email or web address] URL: [email or web address] Date: 07/19/2019 Prepared by: Lyndee Hensen  Exercises Standing Scapular Retraction - 3 x daily - 1 sets - 10 reps Scapular Retraction with Resistance - 1 x daily - 2 sets - 10 reps Shoulder Internal Rotation with Resistance - 1 x daily - 1-2 sets - 10 reps Shoulder External Rotation with Anchored Resistance - 1 x daily - 1-2 sets - 10 reps Standing Shoulder Scaption - 1 x daily - 1 sets - 10 reps Tricep Push Up on Wall - 1 x daily - 2 sets - 10 reps Supine Shoulder Flexion Extension Full Range AROM - 1 x daily - 1-2 sets - 10 reps

## 2019-08-08 ENCOUNTER — Encounter: Payer: Self-pay | Admitting: Physical Therapy

## 2019-08-08 ENCOUNTER — Ambulatory Visit: Payer: Medicare PPO | Admitting: Physical Therapy

## 2019-08-08 ENCOUNTER — Other Ambulatory Visit: Payer: Self-pay

## 2019-08-08 DIAGNOSIS — M25512 Pain in left shoulder: Secondary | ICD-10-CM

## 2019-08-08 NOTE — Therapy (Addendum)
[facility] [address] [place], [place], [address] Phone: [phone]   Fax:  [phone]  Physical Therapy Treatment  Patient Details  Name: Mitchell Alvarado MRN: [id number] Date of Birth: Nov 07, 1950 Referring Provider (PT): Lynne Leader   Encounter Date: 08/08/2019  PT End of Session - 08/08/19 1444    Visit Number  9    Number of Visits  12    Date for PT Re-Evaluation  08/21/19    Authorization Type  [employer]    PT Start Time  1435    PT Stop Time  1510    PT Time Calculation (min)  35 min    Activity Tolerance  Patient tolerated treatment well    Behavior During Therapy  [facility] for tasks assessed/performed       Past Medical History:  Diagnosis Date  . ANEMIA, IRON DEFICIENCY 12/23/2009  . Barrett's esophagus without dysplasia 05/2012  . DIABETES MELLITUS, TYPE II 12/30/2006  . ECZEMA 05/15/2009  . GERD 12/30/2006  . HYPERLIPIDEMIA 12/30/2006  . HYPERTENSION 12/30/2006  . HYPERTHYROIDISM 06/03/2008  . Pernicious anemia 12/30/2006  . Personal history of colonic polyps - adenoma 07/22/2004    Past Surgical History:  Procedure Laterality Date  . COLONOSCOPY  multiple  . ESOPHAGOGASTRODUODENOSCOPY  multiple  . TRANSURETHRAL RESECTION OF PROSTATE  2000    There were no vitals filed for this visit.  Subjective Assessment - 08/08/19 1443    Subjective  Pt reports no pain with regular activites, just a bit sore with repeated elevation (fleixon with HEP)    Currently in Pain?  No/denies    Pain Score  0-No pain         [facility] PT Assessment - 08/08/19 0001      AROM   Left Shoulder Flexion  158 Degrees    Left Shoulder ABduction  155 Degrees    Left Shoulder Internal Rotation  --   wnl   Left Shoulder External Rotation  --   wnl     Strength   Left Shoulder Flexion  4/5    Left Shoulder ABduction  4/5    Left Shoulder Internal Rotation  4+/5    Left Shoulder External Rotation  4+/5                    [facility]  Adult PT Treatment/Exercise - 08/08/19 1453      Posture/Postural Control   Posture Comments  (flexible) rounded shoulders , poor seated posture       Exercises   Exercises  Shoulder      Shoulder Exercises: Supine   Horizontal ABduction  AROM;20 reps    Horizontal ABduction Weight (lbs)  2    External Rotation  20 reps    External Rotation Weight (lbs)  3    Flexion  AROM;20 reps    Shoulder Flexion Weight (lbs)  2    Other Supine Exercises  Chest Press 3 lb x20;     Other Supine Exercises  90/90 holds with perturbations;       Shoulder Exercises: Seated   Flexion  --    Flexion Limitations  --      Shoulder Exercises: Standing   External Rotation  20 reps    Theraband Level (Shoulder External Rotation)  Level 3 (Green)    Internal Rotation  20 reps    Theraband Level (Shoulder Internal Rotation)  Level 3 (Green)    Row  20 reps    Theraband  Level (Shoulder Row)  Level 3 (Green)    Other Standing Exercises  Wall push ups x15,     Other Standing Exercises  Abd to 90 deg x10, Scaption (full )x10      Shoulder Exercises: ROM/Strengthening   UBE (Upper Arm Bike)  L1 x 4 min;       Modalities   Modalities  Iontophoresis      Manual Therapy   Manual Therapy  Soft tissue mobilization             PT Education - 08/08/19 1443    Education Details  FInal HEP updated , reviewed.    Person(s) Educated  Patient    Methods  Explanation;Demonstration;Handout    Comprehension  Verbalized understanding;Returned demonstration;Verbal cues required       PT Short Term Goals - 07/25/19 1443      PT SHORT TERM GOAL #1   Title  Pt to be independent with initial HEP    Time  2    Period  Weeks    Status  Achieved    Target Date  07/24/19        PT Long Term Goals - 08/08/19 1444      PT LONG TERM GOAL #1   Title  Pt to be independent wtih final HEP    Time  6    Period  Weeks    Status  Achieved      PT LONG TERM GOAL #2   Title  Pt to report decreased pain in L  shoulder to 0-1/10 with activity, reaching, and IADLs.    Time  6    Period  Weeks    Status  Achieved      PT LONG TERM GOAL #3   Title  Pt to demo ability to self correct posture at least 75 % of the time in clinic.    Time  6    Period  Weeks    Status  Achieved      PT LONG TERM GOAL #4   Title  Pt to demo improved strength of L shoulder girdle to 5/5    Time  6    Period  Weeks    Status  Partially Met            Plan - 08/08/19 1445    Clinical Impression Statement  Pt has made good progress with pain. Pt with improved AROM, and improved strength. He still has weakness with full flexion and repeated motions. Final HEP reveiwed in detial, discussed need to continue strengthening. Pt ready for d/c, pt in agreement with plan. WIll put pt on hold, until f/u with MD this week.    Examination-Activity Limitations  Reach Overhead;Carry;Sleep;Lift    Examination-Participation Restrictions  Meal Prep;Cleaning;Community Activity;Shop;Yard Work    Stability/Clinical Decision Making  Stable/Uncomplicated    Rehab Potential  Good    PT Frequency  2x / week    PT Duration  6 weeks    PT Treatment/Interventions  ADLs/Self Care Home Management;Cryotherapy;Electrical Stimulation;DME Instruction;Ultrasound;Traction;Moist Heat;Iontophoresis 36m/ml Dexamethasone;Functional mobility training;Therapeutic activities;Therapeutic exercise;Neuromuscular re-education;Manual techniques;Patient/family education;Passive range of motion;Dry needling;Taping;Vasopneumatic Device;Joint Manipulations;Spinal Manipulations    Consulted and Agree with Plan of Care  Patient       Patient will benefit from skilled therapeutic intervention in order to improve the following deficits and impairments:  Decreased range of motion, Impaired UE functional use, Increased muscle spasms, Decreased activity tolerance, Pain, Improper body mechanics, Decreased mobility, Decreased strength  Visit Diagnosis: Acute  pain of  left shoulder     Problem List Patient Active Problem List   Diagnosis Date Noted  . Hyperparathyroidism, secondary ([place]) 05/27/2016  . CKD stage 3 secondary to diabetes ([place]) 05/21/2012  . Iron deficiency anemia 12/23/2009  . ECZEMA 05/15/2009  . Diabetes ([place]) 12/30/2006  . Dyslipidemia and hypertriglyceridemia associated with type 2 diabetes mellitus ([place]) 12/30/2006  . PERNICIOUS ANEMIA 12/30/2006  . Hypertension associated with diabetes ([place]) 12/30/2006  . GERD 12/30/2006  . Personal history of colonic polyps - adenoma 07/22/2004  . Barrett's esophagus -less than 1 cm, does not meet current criteria for this diagnosis 01/24/2002    Lyndee Hensen, PT, DPT 3:11 PM  08/08/19    [facility] [place], [place], [address] Phone: [phone]   Fax:  [phone]  Name: Ellery Meroney Serda MRN: [id number] Date of Birth: 06/19/1950   PHYSICAL THERAPY DISCHARGE SUMMARY  Visits from Start of Care: 9 Plan: Patient agrees to discharge.  Patient goals were met. Patient is being discharged due to meeting the stated rehab goals.  ?????     Lyndee Hensen, PT, DPT 2:16 PM  08/17/20

## 2019-08-08 NOTE — Patient Instructions (Signed)
Access Code: [email or web address] URL: [email or web address] Date: 07/19/2019 Prepared by: Lyndee Hensen  Exercises Standing Scapular Retraction - 3 x daily - 1 sets - 10 reps Scapular Retraction with Resistance - 1 x daily - 2 sets - 10 reps Shoulder Internal Rotation with Resistance - 1 x daily - 1-2 sets - 10 reps Shoulder External Rotation with Anchored Resistance - 1 x daily - 1-2 sets - 10 reps Standing Shoulder Scaption - 1 x daily - 1 sets - 10 reps Tricep Push Up on Wall - 1 x daily - 2 sets - 10 reps Supine Shoulder Flexion Extension Full Range AROM - 1 x daily - 1-2 sets - 10 reps

## 2019-08-15 ENCOUNTER — Ambulatory Visit: Payer: Medicare PPO | Admitting: Family Medicine

## 2019-08-19 ENCOUNTER — Other Ambulatory Visit: Payer: Self-pay | Admitting: Family Medicine

## 2019-09-03 ENCOUNTER — Other Ambulatory Visit: Payer: Medicare Other

## 2019-09-03 ENCOUNTER — Ambulatory Visit: Payer: Medicare Other | Admitting: Hematology

## 2019-09-04 NOTE — Progress Notes (Signed)
HEMATOLOGY/ONCOLOGY CLINIC NOTE  Date of Service: 09/05/2019  Patient Care Team: [name], MD as PCP - General (Family Medicine) [name], MD as Attending Physician (Ophthalmology) [name], MD as Attending Physician (Urology) [name], MD as Consulting Physician (Nephrology) Lyndee Hensen, PT as Physical Therapist (Physical Therapy)  CHIEF COMPLAINTS/PURPOSE OF CONSULTATION:  Anemia  HISTORY OF PRESENTING ILLNESS:   Mitchell Alvarado is a wonderful 69 y.o. male who has been referred to [place] by Dr Renato Shin for evaluation and management of anemia. The pt reports that he is doing well overall.   The pt reports that he has been taking monthly Vitamin B12 injections for the last 4-5 years, and his deficiency was first realized through routine blood work. He was taken off Metformin at the time. He has taken Omeprazole for 20 years for his Barrett's esophagus. He denies any food intolerances or thyroid problems. He notes that he has had some fatigue which he attributes to getting older.   He has seen a nephrologist several years ago and was discharged after replacing his Vitamin D satisfactorily and he denies any liver problems as well.   Most recent lab results (05/29/17) of CBC  is as follows: all values are WNL except for WBC at 3.5k, RBC at 3.04, HGB at 11.3, HCT at 32.9, MCV at 108.0.  Review of CBC w/diff over the last two years reveals stability in the above abnormalities.   On review of systems, pt reports some fatigue, stable weight, and denies problems of infection, bleeding, back pains, pain along the spine, abdominal pains, changes in bowel habits, leg selling, urinary discomfort, and any other symptoms.   On PMHx the pt reports neuropathy, prostate surgery in 2000, denies Afib. On Social Hx the pt reports 10-14 glasses of wine each week  and denies any concern for excessive consumption. He denies smoking and smoked cigarettes for one year a long time  ago  On Family Hx the pt reports paternal heart attack.  Interval History:   [name] returns today for management and evaluation of his macrocytic anemia. The patient's last visit with [place] was on 02/27/2019. The pt reports that he is doing well overall.  The pt reports he is good and has no new concerns. Pt has gotten both doses of the COVID19 vaccine. He has been taking his Vitamin B-complex supplement regularly. Pt has not had any major medication changes over the last 6 months. He is drinking less alcohol. Pt's seeing a nephrologist for CKD about 8 years ago and they said everything was stable and the pt stopped seeing his nephrologist.   Lab results today (09/05/19) of CBC w/diff and CMP is as follows: all values are WNL except for RBC at 2.84, Hemoglobin at 10.1, HCT at 29.5, MCV at 103.9, MCH at 35.6, Glucose at 121, Creatinine at 2.23, Total Protein at 6.4, Alkaline Phosphatase at 27, GFR, Est Non Af Am at 29, GFR, Est AFR Am at 34 09/05/19 of Vitamin B12 at adequate.  On review of systems, pt denies fever, chills, night sweats, abdominal pain and any other symptoms.   MEDICAL HISTORY:  Past Medical History:  Diagnosis Date  . ANEMIA, IRON DEFICIENCY 12/23/2009  . Barrett's esophagus without dysplasia 05/2012  . DIABETES MELLITUS, TYPE II 12/30/2006  . ECZEMA 05/15/2009  . GERD 12/30/2006  . HYPERLIPIDEMIA 12/30/2006  . HYPERTENSION 12/30/2006  . HYPERTHYROIDISM 06/03/2008  . Pernicious anemia 12/30/2006  . Personal history of colonic polyps -  adenoma 07/22/2004    SURGICAL HISTORY: Past Surgical History:  Procedure Laterality Date  . COLONOSCOPY  multiple  . ESOPHAGOGASTRODUODENOSCOPY  multiple  . TRANSURETHRAL RESECTION OF PROSTATE  2000    SOCIAL HISTORY: Social History   Socioeconomic History  . Marital status: Legally Separated    Spouse name: Not on file  . Number of children: Not on file  . Years of education: Not on file  . Highest education level: Not on  file  Occupational History  . Occupation: Retired from school system  Tobacco Use  . Smoking status: Former Research scientist (life sciences)  . Smokeless tobacco: Never Used  Substance and Sexual Activity  . Alcohol use: Yes    Alcohol/week: 7.0 standard drinks    Types: 7 Glasses of wine per week    Comment: 5-7 glasses wine weekly  . Drug use: No  . Sexual activity: Not on file  Other Topics Concern  . 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 Difficulty of Paying Living Expenses:   Food Insecurity:   . Worried About Charity fundraiser in the Last Year:   . Arboriculturist in the Last Year:   Transportation Needs:   . Film/video editor (Medical):   Marland Kitchen Lack of Transportation (Non-Medical):   Physical Activity:   . Days of Exercise per Week:   . Minutes of Exercise per Session:   Stress:   . Feeling of Stress :   Social Connections:   . Frequency of Communication with Friends and Family:   . Frequency of Social Gatherings with Friends and Family:   . Attends Religious Services:   . Active Member of Clubs or Organizations:   . Attends Archivist Meetings:   Marland Kitchen Marital Status:   Intimate Partner Violence:   . Fear of Current or Ex-Partner:   . Emotionally Abused:   Marland Kitchen Physically Abused:   . Sexually Abused:     FAMILY HISTORY: Family History  Problem Relation Age of Onset  . Heart attack Father        MI  . Depression Son   . COPD Son   . Cancer Neg Hx   . Colon cancer Neg Hx     ALLERGIES:  has No Known Allergies.  MEDICATIONS:  Current Outpatient Medications  Medication Sig Dispense Refill  . aspirin 81 MG tablet Take 1 tablet (81 mg total) by mouth daily. 90 tablet 3  . carvedilol (COREG) 3.125 MG tablet Take 1 tablet (3.125 mg total) by mouth 2 (two)  times daily. 180 tablet 0  . Cholecalciferol (VITAMIN D) 2000 units CAPS Take 2,000 Units by mouth daily.    . cyanocobalamin (,VITAMIN B-12,) 1000 MCG/ML injection INJECT 1ML MONTHLY AS DIRECTED 3 mL 0  . ezetimibe-simvastatin (VYTORIN) 10-80 MG tablet TAKE 1 TABLET BY MOUTH EVERY DAY 90 tablet 0  . fenofibrate (TRICOR) 145 MG tablet TAKE 1 TABLET BY MOUTH EVERY DAY 90 tablet 0  . glucose blood (ONETOUCH VERIO) test strip 1 each by Other route daily. And lancets 1/day 100 each 3  . hydrochlorothiazide (HYDRODIURIL) 25 MG tablet TAKE 1 TABLET BY MOUTH EVERY DAY 90 tablet 0  . latanoprost (XALATAN) 0.005 % ophthalmic solution Place 1 drop into  both eyes at bedtime.    Marland Kitchen omeprazole (PRILOSEC) 20 MG capsule TAKE 1 CAPSULE BY MOUTH EVERY DAY 90 capsule 3  . pioglitazone (ACTOS) 45 MG tablet TAKE 1 TABLET BY MOUTH EVERY DAY 90 tablet 0   No current facility-administered medications for this visit.    REVIEW OF SYSTEMS:   A 10+ POINT REVIEW OF SYSTEMS WAS OBTAINED including neurology, dermatology, psychiatry, cardiac, respiratory, lymph, extremities, GI, GU, Musculoskeletal, constitutional, breasts, reproductive, HEENT.  All pertinent positives are noted in the HPI.  All others are negative.   PHYSICAL EXAMINATION:  . Vitals:   09/05/19 1347  BP: (!) 150/65  Pulse: 79  Resp: 18  Temp: (!) 97 F (36.1 C)  SpO2: 98%   Filed Weights   09/05/19 1347  Weight: 270 lb 12.8 oz (122.8 kg)   .Body mass index is 34.77 kg/m.  GENERAL:alert, in no acute distress and comfortable SKIN: no acute rashes, no significant lesions EYES: conjunctiva are pink and non-injected, sclera anicteric OROPHARYNX: MMM, no exudates, no oropharyngeal erythema or ulceration NECK: supple, no JVD LYMPH:  no palpable lymphadenopathy in the cervical, axillary or inguinal regions LUNGS: clear to auscultation b/l with normal respiratory effort HEART: regular rate & rhythm ABDOMEN:  normoactive bowel sounds , non  tender, not distended. Extremity: no pedal edema PSYCH: alert & oriented x 3 with fluent speech NEURO: no focal motor/sensory deficits  LABORATORY DATA:  I have reviewed the data as listed  . CBC Latest Ref Rng & Units 09/05/2019 06/24/2019 02/27/2019  WBC 4.0 - 10.5 K/uL 4.0 3.8(L) 3.0(L)  Hemoglobin 13.0 - 17.0 g/dL 10.1(L) 10.6(L) 10.0(L)  Hematocrit 39 - 52 % 29.5(L) 30.9(L) 29.9(L)  Platelets 150 - 400 K/uL 178 196.0 195   . CBC    Component Value Date/Time   WBC 4.0 09/05/2019 1323   RBC 2.84 (L) 09/05/2019 1323   HGB 10.1 (L) 09/05/2019 1323   HCT 29.5 (L) 09/05/2019 1323   HCT 31.1 (L) 09/07/2017 1405   PLT 178 09/05/2019 1323   MCV 103.9 (H) 09/05/2019 1323   MCH 35.6 (H) 09/05/2019 1323   MCHC 34.2 09/05/2019 1323   RDW 14.9 09/05/2019 1323   LYMPHSABS 1.6 09/05/2019 1323   MONOABS 0.5 09/05/2019 1323   EOSABS 0.1 09/05/2019 1323   BASOSABS 0.0 09/05/2019 1323     . CMP Latest Ref Rng & Units 09/05/2019 06/24/2019 06/24/2019  Glucose 70 - 99 mg/dL 121(H) - 123(H)  BUN 8 - 23 mg/dL 23 - 25(H)  Creatinine 0.61 - 1.24 mg/dL 2.23(H) - 1.83(H)  Sodium 135 - 145 mmol/L 138 - 138  Potassium 3.5 - 5.1 mmol/L 4.4 - 4.5  Chloride 98 - 111 mmol/L 103 - 102  CO2 22 - 32 mmol/L 27 - 27  Calcium 8.9 - 10.3 mg/dL 9.7 10.2 9.9  Total Protein 6.5 - 8.1 g/dL 6.4(L) - 6.6  Total Bilirubin 0.3 - 1.2 mg/dL 0.5 - 0.7  Alkaline Phos 38 - 126 U/L 27(L) - 28(L)  AST 15 - 41 U/L 18 - 15  ALT 0 - 44 U/L 16 - 12   Component     Latest Ref Rng & Units 09/07/2017  IgG (Immunoglobin G), Serum     700 - 1,600 mg/dL 823  IgA     61 - 437 mg/dL 248  IgM (Immunoglobulin M), Srm     20 - 172 mg/dL 143  Total Protein ELP     6.0 - 8.5 g/dL 6.7  Albumin SerPl  Elph-Mcnc     2.9 - 4.4 g/dL 4.0  Alpha 1     0.0 - 0.4 g/dL 0.2  Alpha2 Glob SerPl Elph-Mcnc     0.4 - 1.0 g/dL 0.8  B-Globulin SerPl Elph-Mcnc     0.7 - 1.3 g/dL 1.1  Gamma Glob SerPl Elph-Mcnc     0.4 - 1.8 g/dL 0.6  M  Protein SerPl Elph-Mcnc     Not Observed g/dL Not Observed  Globulin, Total     2.2 - 3.9 g/dL 2.7  Albumin/Glob SerPl     0.7 - 1.7 1.5  IFE 1      Comment  Please Note (HCV):      Comment  Folate, Hemolysate     Not Estab. ng/mL 204.0  HCT     37.5 - 51.0 % 31.1 (L)  Folate, RBC     >498 ng/mL 656  Vitamin B12     180 - 914 pg/mL 6,847 (H)  TSH     0.320 - 4.118 uIU/mL 1.016  Ferritin     22 - 316 ng/mL 867 (H)  Copper     72 - 166 ug/dL 97  Parietal Cell Antibody-IgG     0.0 - 20.0 Units 10.9  Intrinsic Factor     0.0 - 1.1 AU/mL 11.8 (H)    RADIOGRAPHIC STUDIES: I have personally reviewed the radiological images as listed and agreed with the findings in the report. No results found.  ASSESSMENT & PLAN:   69 y.o. male with  1. Macrocytic Anemia Review of patient's CBCs w/diff over the last two years reveals stability on the above abnormalities  Patient appears to have pernicious anemia with anti intrinsic factor antibodies. Currently no B12 or folate deficiency deficiency.but could have other B vit deficiencies. No monoclonal paraproteinemia. Concern for heavy ETOH use Has some CKD that could be contributing to the anemia as well. No known liver disease No overt bleeding noted.  2. B12 deficiency -multifactorial -- pernicious anemia + previous metformin use + current PPI use.  PLAN: -Discussed pt labwork today, 09/05/19; of CBC w/diff and CMP is as follows: all values are WNL except for RBC at 2.84, Hemoglobin at 10.1, HCT at 29.5, MCV at 103.9, MCH at 35.6, Glucose at 121, Creatinine at 2.23, Total Protein at 6.4, Alkaline Phosphatase at 27, GFR, Est Non Af Am at 29, GFR, Est AFR Am at 34 -Discussed 09/05/19 of Vitamin B12 at >7500 -Advised on testosterone levels  -Advised on mild anemia  -Advised on anemia of CKD, keeping higher iron levels and  replace erythropoietin -neither is needed currently  -Advised on hydrochlorothiazide -staying hydrated   -Advised on low grade MDS -Advised on bone marrow examination to rule out  -Advised on continuing to watch blood counts, pt prefers this over bone marrow biopsy  -Advised keeping iron levels higher  -Hgb has been continuously low but not progressive. Anemia does not appear to be due to nutritional deficiencies or hemolysis. Discussed that I suspect either alcohol vs medication effect. -Discussed aggressive options (BM Bx) vs. conservative options (watching with labs) -Continue Vitamin B12 supplement -Recommended to empirically start taking Vitamin B complex  -Recommend continuing to minimize alcohol consumption -Recommends staying hydrated  -Recommends keeping up with age appropriate cancer screenings -Continue f/u with PCP  -Recommends if labs are steady in 6 months alternating with PCP -Will see back in 6 months with labs   3. . Patient Active Problem List   Diagnosis Date Noted  .  Hyperparathyroidism, secondary ([place]) 05/27/2016  . CKD stage 3 secondary to diabetes ([place]) 05/21/2012  . Iron deficiency anemia 12/23/2009  . ECZEMA 05/15/2009  . Diabetes ([place]) 12/30/2006  . Dyslipidemia and hypertriglyceridemia associated with type 2 diabetes mellitus ([place]) 12/30/2006  . PERNICIOUS ANEMIA 12/30/2006  . Hypertension associated with diabetes ([place]) 12/30/2006  . GERD 12/30/2006  . Personal history of colonic polyps - adenoma 07/22/2004  . Barrett's esophagus -less than 1 cm, does not meet current criteria for this diagnosis 01/24/2002  -Mx per PCP  FOLLOW UP: RTC with Dr Irene Limbo with labs in 6 months  The total time spent in the appt was 20 minutes and more than 50% was on counseling and direct patient cares.  All of the patient's questions were answered with apparent satisfaction. The patient knows to call the clinic with any problems, questions or concerns.  Sullivan Lone MD [place] AAHIVMS [facility] Hematology/Oncology Physician [facility]  (Office):        [phone] (Work cell):  [phone] (Fax):           [phone]  09/05/2019 8:37 AM  I, Dawayne Cirri am acting as a scribe for Dr. Sullivan Lone.   .I have reviewed the above documentation for accuracy and completeness, and I agree with the above. Brunetta Genera MD

## 2019-09-05 ENCOUNTER — Inpatient Hospital Stay: Payer: Medicare PPO | Attending: Hematology

## 2019-09-05 ENCOUNTER — Other Ambulatory Visit: Payer: Self-pay

## 2019-09-05 ENCOUNTER — Inpatient Hospital Stay (HOSPITAL_BASED_OUTPATIENT_CLINIC_OR_DEPARTMENT_OTHER): Payer: Medicare PPO | Admitting: Hematology

## 2019-09-05 ENCOUNTER — Telehealth: Payer: Self-pay | Admitting: Hematology

## 2019-09-05 VITALS — BP 150/65 | HR 79 | Temp 97.0°F | Resp 18 | Ht 74.0 in | Wt 270.8 lb

## 2019-09-05 DIAGNOSIS — D510 Vitamin B12 deficiency anemia due to intrinsic factor deficiency: Secondary | ICD-10-CM

## 2019-09-05 DIAGNOSIS — D539 Nutritional anemia, unspecified: Secondary | ICD-10-CM | POA: Insufficient documentation

## 2019-09-05 DIAGNOSIS — E538 Deficiency of other specified B group vitamins: Secondary | ICD-10-CM | POA: Diagnosis not present

## 2019-09-05 DIAGNOSIS — N189 Chronic kidney disease, unspecified: Secondary | ICD-10-CM | POA: Insufficient documentation

## 2019-09-05 DIAGNOSIS — Z87891 Personal history of nicotine dependence: Secondary | ICD-10-CM | POA: Insufficient documentation

## 2019-09-05 DIAGNOSIS — D631 Anemia in chronic kidney disease: Secondary | ICD-10-CM | POA: Diagnosis not present

## 2019-09-05 LAB — CBC WITH DIFFERENTIAL/PLATELET
Abs Immature Granulocytes: 0.01 10*3/uL (ref 0.00–0.07)
Basophils Absolute: 0 10*3/uL (ref 0.0–0.1)
Basophils Relative: 1 %
Eosinophils Absolute: 0.1 10*3/uL (ref 0.0–0.5)
Eosinophils Relative: 3 %
HCT: 29.5 % — ABNORMAL LOW (ref 39.0–52.0)
Hemoglobin: 10.1 g/dL — ABNORMAL LOW (ref 13.0–17.0)
Immature Granulocytes: 0 %
Lymphocytes Relative: 41 %
Lymphs Abs: 1.6 10*3/uL (ref 0.7–4.0)
MCH: 35.6 pg — ABNORMAL HIGH (ref 26.0–34.0)
MCHC: 34.2 g/dL (ref 30.0–36.0)
MCV: 103.9 fL — ABNORMAL HIGH (ref 80.0–100.0)
Monocytes Absolute: 0.5 10*3/uL (ref 0.1–1.0)
Monocytes Relative: 12 %
Neutro Abs: 1.7 10*3/uL (ref 1.7–7.7)
Neutrophils Relative %: 43 %
Platelets: 178 10*3/uL (ref 150–400)
RBC: 2.84 MIL/uL — ABNORMAL LOW (ref 4.22–5.81)
RDW: 14.9 % (ref 11.5–15.5)
WBC: 4 10*3/uL (ref 4.0–10.5)
nRBC: 0 % (ref 0.0–0.2)

## 2019-09-05 LAB — CMP (CANCER CENTER ONLY)
ALT: 16 U/L (ref 0–44)
AST: 18 U/L (ref 15–41)
Albumin: 3.6 g/dL (ref 3.5–5.0)
Alkaline Phosphatase: 27 U/L — ABNORMAL LOW (ref 38–126)
Anion gap: 8 (ref 5–15)
BUN: 23 mg/dL (ref 8–23)
CO2: 27 mmol/L (ref 22–32)
Calcium: 9.7 mg/dL (ref 8.9–10.3)
Chloride: 103 mmol/L (ref 98–111)
Creatinine: 2.23 mg/dL — ABNORMAL HIGH (ref 0.61–1.24)
GFR, Est AFR Am: 34 mL/min — ABNORMAL LOW (ref >60)
GFR, Estimated: 29 mL/min — ABNORMAL LOW (ref >60)
Glucose, Bld: 121 mg/dL — ABNORMAL HIGH (ref 70–99)
Potassium: 4.4 mmol/L (ref 3.5–5.1)
Sodium: 138 mmol/L (ref 135–145)
Total Bilirubin: 0.5 mg/dL (ref 0.3–1.2)
Total Protein: 6.4 g/dL — ABNORMAL LOW (ref 6.5–8.1)

## 2019-09-05 LAB — VITAMIN B12: Vitamin B-12: >7500 pg/mL — ABNORMAL HIGH (ref 180–914)

## 2019-09-05 NOTE — Telephone Encounter (Signed)
Scheduled per 6/10 los. Printed avs and calendar for pt.

## 2019-11-03 ENCOUNTER — Other Ambulatory Visit: Payer: Self-pay | Admitting: Family Medicine

## 2019-11-13 ENCOUNTER — Other Ambulatory Visit: Payer: Self-pay | Admitting: Family Medicine

## 2019-11-14 ENCOUNTER — Other Ambulatory Visit: Payer: Self-pay | Admitting: Family Medicine

## 2019-12-26 ENCOUNTER — Other Ambulatory Visit: Payer: Self-pay

## 2019-12-26 ENCOUNTER — Ambulatory Visit (INDEPENDENT_AMBULATORY_CARE_PROVIDER_SITE_OTHER): Payer: Medicare PPO | Admitting: Family Medicine

## 2019-12-26 ENCOUNTER — Encounter: Payer: Self-pay | Admitting: Family Medicine

## 2019-12-26 VITALS — BP 136/82 | HR 79 | Temp 98.0°F | Ht 74.0 in | Wt 270.8 lb

## 2019-12-26 DIAGNOSIS — I10 Essential (primary) hypertension: Secondary | ICD-10-CM | POA: Diagnosis not present

## 2019-12-26 DIAGNOSIS — E1122 Type 2 diabetes mellitus with diabetic chronic kidney disease: Secondary | ICD-10-CM

## 2019-12-26 DIAGNOSIS — R29898 Other symptoms and signs involving the musculoskeletal system: Secondary | ICD-10-CM

## 2019-12-26 DIAGNOSIS — N183 Chronic kidney disease, stage 3 unspecified: Secondary | ICD-10-CM

## 2019-12-26 DIAGNOSIS — E1159 Type 2 diabetes mellitus with other circulatory complications: Secondary | ICD-10-CM

## 2019-12-26 LAB — POCT GLYCOSYLATED HEMOGLOBIN (HGB A1C): Hemoglobin A1C: 5.1 % (ref 4.0–5.6)

## 2019-12-26 NOTE — Assessment & Plan Note (Signed)
At goal.  Continue Coreg 3.125 mg twice daily and HCTZ 25 mg daily.

## 2019-12-26 NOTE — Assessment & Plan Note (Signed)
A1c 5.1.  We will stop Actos.  Continue lifestyle modifications and recheck in 6 months.

## 2019-12-26 NOTE — Patient Instructions (Signed)
It was very nice to see you today!  Your A1c looks great.  We will stop Actos today.  Please work on the leg exercises.  I will see you back in 6 months for your annual physical with blood work please come back to see me sooner if needed.  Take care, Dr Jerline Pain  Please try these tips to maintain a healthy lifestyle:   Eat at least 3 REAL meals and 1-2 snacks per day.  Aim for no more than 5 hours between eating.  If you eat breakfast, please do so within one hour of getting up.    Each meal should contain half fruits/vegetables, one quarter protein, and one quarter carbs (no bigger than a computer mouse)   Cut down on sweet beverages. This includes juice, soda, and sweet tea.     Drink at least 1 glass of water with each meal and aim for at least 8 glasses per day   Exercise at least 150 minutes every week.

## 2019-12-26 NOTE — Progress Notes (Signed)
   Mitchell Alvarado is a 69 y.o. male who presents today for an office visit.  Assessment/Plan:  New/Acute Problems: Leg Weakness Likely deconditioning.  No red flags.  Discussed referral to PT however patient declined.  Discussed home exercises and handout was given.  Discussed importance of regular exercise.  Chronic Problems Addressed Today: Hypertension associated with diabetes ([place]) At goal.  Continue Coreg 3.125 mg twice daily and HCTZ 25 mg daily.  Diabetes ([facility]) A1c 5.1.  We will stop Actos.  Continue lifestyle modifications and recheck in 6 months.    Subjective:  HPI:  Patient here today for follow-up.  Overall doing well.  Has been compliant with medications.  No reported side effects.  He has been having some more leg weakness over the last several weeks to months.  Has had some more difficulty getting in and out of the car.        Objective:  Physical Exam: BP 136/82   Pulse 79   Temp 98 F (36.7 C) (Temporal)   Ht 6\' 2"  (1.88 m)   Wt 270 lb 12.8 oz (122.8 kg)   SpO2 100%   BMI 34.77 kg/m   Gen: No acute distress, resting comfortably CV: Regular rate and rhythm with no murmurs appreciated Pulm: Normal work of breathing, clear to auscultation bilaterally with no crackles, wheezes, or rhonchi MSK: Bilateral lower extremities with 5 out of 5 strength throughout. Neuro: Grossly normal, moves all extremities Psych: Normal affect and thought content      Alianys Chacko M. Jerline Pain, MD 12/26/2019 1:55 PM

## 2020-01-09 ENCOUNTER — Ambulatory Visit (INDEPENDENT_AMBULATORY_CARE_PROVIDER_SITE_OTHER): Payer: Medicare PPO

## 2020-01-09 DIAGNOSIS — Z0000 Encounter for general adult medical examination without abnormal findings: Secondary | ICD-10-CM | POA: Diagnosis not present

## 2020-01-09 NOTE — Progress Notes (Signed)
Virtual Visit via Telephone Note  I connected with  G[place]is on 01/09/20 at  1:00 PM EDT by telephone and verified that I am speaking with the correct person using two identifiers.  Medicare Annual Wellness visit completed telephonically due to Covid-19 pandemic.   Persons participating in this call: This Health Coach and this patient.   Location: Patient: Home Provider: Office   I discussed the limitations, risks, security and privacy concerns of performing an evaluation and management service by telephone and the availability of in person appointments. The patient expressed understanding and agreed to proceed.  Unable to perform video visit due to video visit attempted and failed and/or patient does not have video capability.   Some vital signs may be absent or patient reported.   Willette Brace, LPN    Subjective:   Trashawn Oquendo Mathias is a 69 y.o. male who presents for Medicare Annual/Subsequent preventive examination.  Review of Systems     Cardiac Risk Factors include: diabetes mellitus;dyslipidemia;obesity (BMI >30kg/m2);hypertension     Objective:    There were no vitals filed for this visit. There is no height or weight on file to calculate BMI.  Advanced Directives 01/09/2020 09/05/2019 07/11/2019 12/06/2018 05/29/2017 11/29/2016 11/24/2015  Does Patient Have a Medical Advance Directive? Yes No No Yes No Yes No;Yes  Type of Paramedic of [place];Living will - - Living will - [place];Living will [place]  Does patient want to make changes to medical advance directive? - - - No - Patient declined - - -  Copy of [place] in Chart? No - copy requested - - - - Yes No - copy requested  Would patient like information on creating a medical advance directive? - No - Patient declined - - - - -    Current Medications (verified) Outpatient Encounter Medications as of 01/09/2020  Medication  Sig  . aspirin 81 MG tablet Take 1 tablet (81 mg total) by mouth daily.  . calcitRIOL (ROCALTROL) 0.25 MCG capsule   . carvedilol (COREG) 3.125 MG tablet TAKE 1 TABLET (3.125 MG TOTAL) BY MOUTH 2 (TWO) TIMES DAILY.  Marland Kitchen Cholecalciferol (VITAMIN D) 2000 units CAPS Take 2,000 Units by mouth daily.  . cyanocobalamin (,VITAMIN B-12,) 1000 MCG/ML injection INJECT 1ML MONTHLY AS DIRECTED  . ezetimibe-simvastatin (VYTORIN) 10-80 MG tablet TAKE 1 TABLET BY MOUTH EVERY DAY  . fenofibrate (TRICOR) 145 MG tablet TAKE 1 TABLET BY MOUTH EVERY DAY  . glucose blood (ONETOUCH VERIO) test strip 1 each by Other route daily. And lancets 1/day  . hydrochlorothiazide (HYDRODIURIL) 25 MG tablet TAKE 1 TABLET BY MOUTH EVERY DAY  . latanoprost (XALATAN) 0.005 % ophthalmic solution Place 1 drop into both eyes at bedtime.  Marland Kitchen omeprazole (PRILOSEC) 20 MG capsule TAKE 1 CAPSULE BY MOUTH EVERY DAY   No facility-administered encounter medications on file as of 01/09/2020.    Allergies (verified) Patient has no known allergies.   History: Past Medical History:  Diagnosis Date  . ANEMIA, IRON DEFICIENCY 12/23/2009  . Barrett's esophagus without dysplasia 05/2012  . DIABETES MELLITUS, TYPE II 12/30/2006  . ECZEMA 05/15/2009  . GERD 12/30/2006  . HYPERLIPIDEMIA 12/30/2006  . HYPERTENSION 12/30/2006  . HYPERTHYROIDISM 06/03/2008  . Pernicious anemia 12/30/2006  . Personal history of colonic polyps - adenoma 07/22/2004   Past Surgical History:  Procedure Laterality Date  . COLONOSCOPY  multiple  . ESOPHAGOGASTRODUODENOSCOPY  multiple  . TRANSURETHRAL RESECTION OF PROSTATE  2000   Family History  Problem Relation Age of Onset  . Heart attack Father        MI  . Depression Son   . COPD Son   . Cancer Neg Hx   . Colon cancer Neg Hx    Social History   Socioeconomic History  . Marital status: Legally Separated    Spouse name: Not on file  . Number of children: Not on file  . Years of education: Not on file  .  Highest education level: Not on file  Occupational History  . Occupation: Retired from school system  Tobacco Use  . Smoking status: Former Research scientist (life sciences)  . Smokeless tobacco: Never Used  Substance and Sexual Activity  . Alcohol use: Yes    Alcohol/week: 7.0 standard drinks    Types: 7 Glasses of wine per week    Comment: 5-7 glasses wine weekly  . Drug use: No  . Sexual activity: Not on file  Other Topics Concern  . 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 Difficulty of Paying Living Expenses: Not hard at all  Food Insecurity: No Food Insecurity  . Worried About Charity fundraiser in the Last Year: Never true  . Ran Out of Food in the Last Year: Never true  Transportation Needs: No Transportation Needs  . Lack of Transportation (Medical): No  . Lack of Transportation (Non-Medical): No  Physical Activity: Insufficiently Active  . Days of Exercise per Week: 3 days  . Minutes of Exercise per Session: 20 min  Stress: No Stress Concern Present  . Feeling of Stress : Not at all  Social Connections: Socially Isolated  . Frequency of Communication with Friends and Family: More than three times a week  . Frequency of Social Gatherings with Friends and Family: More than three times a week  . Attends Religious Services: Never  . Active Member of Clubs or Organizations: No  . Attends Archivist Meetings: Never  . Marital Status: Separated    Tobacco Counseling Counseling given: Not Answered   Clinical Intake:  Pre-visit preparation completed: Yes  Pain : No/denies pain     BMI - recorded: 34.77 Nutritional Status: BMI > 30  Obese Nutritional Risks: None Diabetes: Yes CBG done?: No Did pt. bring in CBG monitor from home?: No  How often do you  need to have someone help you when you read instructions, pamphlets, or other written materials from your doctor or pharmacy?: 1 - Never  Diabetic?Yes  Interpreter Needed?: No  Information entered by :: Charlott Rakes, LPN   Activities of Daily Living In your present state of health, do you have any difficulty performing the following activities: 01/09/2020  Hearing? N  Vision? N  Difficulty concentrating or making decisions? N  Walking or climbing stairs? Y  Dressing or bathing? N  Doing errands, shopping? N  Preparing Food and eating ? N  Using the Toilet? N  In the past six months, have you accidently leaked urine? N  Do you have problems with loss of bowel control? N  Managing your Medications? N  Managing your Finances? N  Housekeeping or managing your Housekeeping? N  Some recent data might be hidden  Patient Care Team: [name], MD as PCP - General (Family Medicine) [name], MD as Attending Physician (Ophthalmology) [name], MD as Attending Physician (Urology) [name], MD as Consulting Physician (Nephrology) Lyndee Hensen, PT as Physical Therapist (Physical Therapy)  Indicate any recent Medical Services you may have received from other than [facility] providers in the past year (date may be approximate).     Assessment:   This is a routine wellness examination for Fannie.  Hearing/Vision screen  Hearing Screening   125Hz  250Hz  500Hz  1000Hz  2000Hz  3000Hz  4000Hz  6000Hz  8000Hz   Right ear:           Left ear:           Comments: Pt denies any difficulty hearing at this time  Vision Screening Comments: Pt follows up annually with Dr Ellie Lunch for eye exams  Dietary issues and exercise activities discussed: Current Exercise Habits: Home exercise routine, Type of exercise: walking, Time (Minutes): 15, Frequency (Times/Week): 3, Weekly Exercise (Minutes/Week): 45, Exercise limited by: orthopedic condition(s)  Goals    . patient     Lose  15lbs      Depression Screen PHQ 2/9 Scores 01/09/2020 06/24/2019 12/06/2018 12/06/2018 06/04/2018 05/29/2017  PHQ - 2 Score 0 0 0 0 0 0  PHQ- 9 Score - 0 - - - -    Fall Risk Fall Risk  01/09/2020 12/06/2018 12/06/2018 05/29/2017  Falls in the past year? 0 0 0 No  Number falls in past yr: 0 0 - -  Injury with Fall? 0 0 - -  Risk for fall due to : Impaired balance/gait;Impaired mobility;Impaired vision - - -  Follow up Falls prevention discussed Education provided;Falls evaluation completed - -    Any stairs in or around the home? Yes  If so, are there any without handrails? No  Home free of loose throw rugs in walkways, pet beds, electrical cords, etc? Yes  Adequate lighting in your home to reduce risk of falls? Yes   ASSISTIVE DEVICES UTILIZED TO PREVENT FALLS:  Life alert? No  Use of a cane, walker or w/c? Yes  Grab bars in the bathroom? Yes  Shower chair or bench in shower? No  Elevated toilet seat or a handicapped toilet? No   TIMED UP AND GO:  Was the test performed? No .      Cognitive Function:     6CIT Screen 01/09/2020 12/06/2018  What Year? 0 points 0 points  What month? 0 points 0 points  What time? - 0 points  Count back from 20 0 points 0 points  Months in reverse 0 points 0 points  Repeat phrase 0 points 0 points  Total Score - 0    Immunizations Immunization History  Administered Date(s) Administered  . Fluad Quad(high Dose 65+) 12/06/2018  . Influenza Whole 12/26/2008  . Influenza,inj,Quad PF,6+ Mos 11/29/2016  . Influenza-Unspecified 12/27/2012, 12/06/2013, 03/28/2014, 12/12/2017  . PFIZER SARS-COV-2 Vaccination 05/04/2019, 05/25/2019  . Pneumococcal Conjugate-13 05/26/2015  . Pneumococcal Polysaccharide-23 05/15/2009, 11/28/2017  . Td 01/27/1995  . Tdap 01/15/2014  . Zoster 05/26/2015    TDAP status: Up to date Flu Vaccine status: Up to date Pneumococcal vaccine status: Up to date Covid-19 vaccine status: Completed vaccines  Qualifies for  Shingles Vaccine? Yes   Zostavax completed Yes   Shingrix Completed?: No.    Education has been provided regarding the importance of this vaccine. Patient has been advised to call insurance company to determine out of pocket expense if they have  not yet received this vaccine. Advised may also receive vaccine at local pharmacy or Health Dept. Verbalized acceptance and understanding.  Screening Tests Health Maintenance  Topic Date Due  . INFLUENZA VACCINE  10/27/2019  . OPHTHALMOLOGY EXAM  01/23/2020  . FOOT EXAM  06/23/2020  . URINE MICROALBUMIN  06/23/2020  . HEMOGLOBIN A1C  06/24/2020  . COLONOSCOPY  07/26/2020  . TETANUS/TDAP  01/16/2024  . COVID-19 Vaccine  Completed  . Hepatitis C Screening  Completed  . PNA vac Low Risk Adult  Completed    Health Maintenance  Health Maintenance Due  Topic Date Due  . INFLUENZA VACCINE  10/27/2019    Colorectal cancer screening: Completed 07/27/15. Repeat every 5 years   Additional Screening:  Hepatitis C Screening: Completed 05/27/16  Vision Screening: Recommended annual ophthalmology exams for early detection of glaucoma and other disorders of the eye. Is the patient up to date with their annual eye exam?  Yes  Who is the provider or what is the name of the office in which the patient attends annual eye exams? Dr Ellie Lunch    Dental Screening: Recommended annual dental exams for proper oral hygiene  Community Resource Referral / Chronic Care Management: CRR required this visit?  No   CCM required this visit?  No      Plan:     I have personally reviewed and noted the following in the patient's chart:   . Medical and social history . Use of alcohol, tobacco or illicit drugs  . Current medications and supplements . Functional ability and status . Nutritional status . Physical activity . Advanced directives . List of other physicians . Hospitalizations, surgeries, and ER visits in previous 12 months . Vitals . Screenings to  include cognitive, depression, and falls . Referrals and appointments  In addition, I have reviewed and discussed with patient certain preventive protocols, quality metrics, and best practice recommendations. A written personalized care plan for preventive services as well as general preventive health recommendations were provided to patient.     Willette Brace, LPN   [id number]   Nurse Notes: None

## 2020-01-09 NOTE — Patient Instructions (Addendum)
[name] , Thank you for taking time to come for your Medicare Wellness Visit. I appreciate your ongoing commitment to your health goals. Please review the following plan we discussed and let me know if I can assist you in the future.   Screening recommendations/referrals: Colonoscopy: Done 07/27/15 Recommended yearly ophthalmology/optometry visit for glaucoma screening and checkup Recommended yearly dental visit for hygiene and checkup  Vaccinations: Influenza vaccine: Pt stated 12/26/19 in [facility] office Pneumococcal vaccine: Up to date Tdap vaccine: Up to date Shingles vaccine: Shingrix discussed. Please contact your pharmacy for coverage information.    Covid-19: Completed 05/04/19 & 05/25/19  Advanced directives: Please bring a copy of your health care power of attorney and living will to the office at your convenience.  Conditions/risks identified: Lose 15lbs  Next appointment: Follow up in one year for your annual wellness visit.   Preventive Care 69 Years and Older, Male Preventive care refers to lifestyle choices and visits with your health care provider that can promote health and wellness. What does preventive care include?  A yearly physical exam. This is also called an annual well check.  Dental exams once or twice a year.  Routine eye exams. Ask your health care provider how often you should have your eyes checked.  Personal lifestyle choices, including:  Daily care of your teeth and gums.  Regular physical activity.  Eating a healthy diet.  Avoiding tobacco and drug use.  Limiting alcohol use.  Practicing safe sex.  Taking low doses of aspirin every day.  Taking vitamin and mineral supplements as recommended by your health care provider. What happens during an annual well check? The services and screenings done by your health care provider during your annual well check will depend on your age, overall health, lifestyle risk factors, and family history of  disease. Counseling  Your health care provider may ask you questions about your:  Alcohol use.  Tobacco use.  Drug use.  Emotional well-being.  Home and relationship well-being.  Sexual activity.  Eating habits.  History of falls.  Memory and ability to understand (cognition).  Work and work Statistician. Screening  You may have the following tests or measurements:  Height, weight, and BMI.  Blood pressure.  Lipid and cholesterol levels. These may be checked every 5 years, or more frequently if you are over 61 years old.  Skin check.  Lung cancer screening. You may have this screening every year starting at age 78 if you have a 30-pack-year history of smoking and currently smoke or have quit within the past 15 years.  Fecal occult blood test (FOBT) of the stool. You may have this test every year starting at age 63.  Flexible sigmoidoscopy or colonoscopy. You may have a sigmoidoscopy every 5 years or a colonoscopy every 10 years starting at age 79.  Prostate cancer screening. Recommendations will vary depending on your family history and other risks.  Hepatitis C blood test.  Hepatitis B blood test.  Sexually transmitted disease (STD) testing.  Diabetes screening. This is done by checking your blood sugar (glucose) after you have not eaten for a while (fasting). You may have this done every 1-3 years.  Abdominal aortic aneurysm (AAA) screening. You may need this if you are a current or former smoker.  Osteoporosis. You may be screened starting at age 52 if you are at high risk. Talk with your health care provider about your test results, treatment options, and if necessary, the need for more tests. Vaccines  Your health care provider may recommend certain vaccines, such as:  Influenza vaccine. This is recommended every year.  Tetanus, diphtheria, and acellular pertussis (Tdap, Td) vaccine. You may need a Td booster every 10 years.  Zoster vaccine. You may  need this after age 64.  Pneumococcal 13-valent conjugate (PCV13) vaccine. One dose is recommended after age 73.  Pneumococcal polysaccharide (PPSV23) vaccine. One dose is recommended after age 55. Talk to your health care provider about which screenings and vaccines you need and how often you need them. This information is not intended to replace advice given to you by your health care provider. Make sure you discuss any questions you have with your health care provider. Document Released: 04/10/2015 Document Revised: 12/02/2015 Document Reviewed: 01/13/2015 Elsevier Interactive Patient Education  2017 [employer] Prevention in the Home Falls can cause injuries. They can happen to people of all ages. There are many things you can do to make your home safe and to help prevent falls. What can I do on the outside of my home?  Regularly fix the edges of walkways and driveways and fix any cracks.  Remove anything that might make you trip as you walk through a door, such as a raised step or threshold.  Trim any bushes or trees on the path to your home.  Use bright outdoor lighting.  Clear any walking paths of anything that might make someone trip, such as rocks or tools.  Regularly check to see if handrails are loose or broken. Make sure that both sides of any steps have handrails.  Any raised decks and porches should have guardrails on the edges.  Have any leaves, snow, or ice cleared regularly.  Use sand or salt on walking paths during winter.  Clean up any spills in your garage right away. This includes oil or grease spills. What can I do in the bathroom?  Use night lights.  Install grab bars by the toilet and in the tub and shower. Do not use towel bars as grab bars.  Use non-skid mats or decals in the tub or shower.  If you need to sit down in the shower, use a plastic, non-slip stool.  Keep the floor dry. Clean up any water that spills on the floor as soon as it  happens.  Remove soap buildup in the tub or shower regularly.  Attach bath mats securely with double-sided non-slip rug tape.  Do not have throw rugs and other things on the floor that can make you trip. What can I do in the bedroom?  Use night lights.  Make sure that you have a light by your bed that is easy to reach.  Do not use any sheets or blankets that are too big for your bed. They should not hang down onto the floor.  Have a firm chair that has side arms. You can use this for support while you get dressed.  Do not have throw rugs and other things on the floor that can make you trip. What can I do in the kitchen?  Clean up any spills right away.  Avoid walking on wet floors.  Keep items that you use a lot in easy-to-reach places.  If you need to reach something above you, use a strong step stool that has a grab bar.  Keep electrical cords out of the way.  Do not use floor polish or wax that makes floors slippery. If you must use wax, use non-skid floor wax.  Do  not have throw rugs and other things on the floor that can make you trip. What can I do with my stairs?  Do not leave any items on the stairs.  Make sure that there are handrails on both sides of the stairs and use them. Fix handrails that are broken or loose. Make sure that handrails are as long as the stairways.  Check any carpeting to make sure that it is firmly attached to the stairs. Fix any carpet that is loose or worn.  Avoid having throw rugs at the top or bottom of the stairs. If you do have throw rugs, attach them to the floor with carpet tape.  Make sure that you have a light switch at the top of the stairs and the bottom of the stairs. If you do not have them, ask someone to add them for you. What else can I do to help prevent falls?  Wear shoes that:  Do not have high heels.  Have rubber bottoms.  Are comfortable and fit you well.  Are closed at the toe. Do not wear sandals.  If you  use a stepladder:  Make sure that it is fully opened. Do not climb a closed stepladder.  Make sure that both sides of the stepladder are locked into place.  Ask someone to hold it for you, if possible.  Clearly mark and make sure that you can see:  Any grab bars or handrails.  First and last steps.  Where the edge of each step is.  Use tools that help you move around (mobility aids) if they are needed. These include:  Canes.  Walkers.  Scooters.  Crutches.  Turn on the lights when you go into a dark area. Replace any light bulbs as soon as they burn out.  Set up your furniture so you have a clear path. Avoid moving your furniture around.  If any of your floors are uneven, fix them.  If there are any pets around you, be aware of where they are.  Review your medicines with your doctor. Some medicines can make you feel dizzy. This can increase your chance of falling. Ask your doctor what other things that you can do to help prevent falls. This information is not intended to replace advice given to you by your health care provider. Make sure you discuss any questions you have with your health care provider. Document Released: 01/08/2009 Document Revised: 08/20/2015 Document Reviewed: 04/18/2014 Elsevier Interactive Patient Education  2017 [employer].

## 2020-01-24 DIAGNOSIS — H40013 Open angle with borderline findings, low risk, bilateral: Secondary | ICD-10-CM | POA: Diagnosis not present

## 2020-01-24 DIAGNOSIS — E113293 Type 2 diabetes mellitus with mild nonproliferative diabetic retinopathy without macular edema, bilateral: Secondary | ICD-10-CM | POA: Diagnosis not present

## 2020-01-24 DIAGNOSIS — H40053 Ocular hypertension, bilateral: Secondary | ICD-10-CM | POA: Diagnosis not present

## 2020-01-24 DIAGNOSIS — H52203 Unspecified astigmatism, bilateral: Secondary | ICD-10-CM | POA: Diagnosis not present

## 2020-01-24 LAB — HM DIABETES EYE EXAM

## 2020-01-27 ENCOUNTER — Encounter: Payer: Self-pay | Admitting: Family Medicine

## 2020-02-02 ENCOUNTER — Other Ambulatory Visit: Payer: Self-pay | Admitting: Family Medicine

## 2020-02-03 ENCOUNTER — Other Ambulatory Visit: Payer: Self-pay | Admitting: Family Medicine

## 2020-02-04 ENCOUNTER — Other Ambulatory Visit: Payer: Self-pay | Admitting: Family Medicine

## 2020-02-04 NOTE — Telephone Encounter (Signed)
LAST APPOINTMENT DATE: 12/26/2019   NEXT APPOINTMENT DATE: 06/25/2020    LAST REFILL: 11/15/2019  By historical provider

## 2020-02-09 ENCOUNTER — Other Ambulatory Visit: Payer: Self-pay | Admitting: Family Medicine

## 2020-03-05 ENCOUNTER — Inpatient Hospital Stay: Payer: Medicare PPO | Attending: Hematology | Admitting: Hematology

## 2020-03-05 ENCOUNTER — Other Ambulatory Visit: Payer: Self-pay

## 2020-03-05 ENCOUNTER — Inpatient Hospital Stay: Payer: Medicare PPO

## 2020-03-05 VITALS — BP 169/78 | HR 90 | Temp 97.5°F | Resp 18 | Ht 74.0 in | Wt 268.0 lb

## 2020-03-05 DIAGNOSIS — D510 Vitamin B12 deficiency anemia due to intrinsic factor deficiency: Secondary | ICD-10-CM

## 2020-03-05 DIAGNOSIS — D539 Nutritional anemia, unspecified: Secondary | ICD-10-CM | POA: Diagnosis not present

## 2020-03-05 DIAGNOSIS — E538 Deficiency of other specified B group vitamins: Secondary | ICD-10-CM

## 2020-03-05 DIAGNOSIS — Z7289 Other problems related to lifestyle: Secondary | ICD-10-CM | POA: Insufficient documentation

## 2020-03-05 DIAGNOSIS — Z87891 Personal history of nicotine dependence: Secondary | ICD-10-CM | POA: Diagnosis not present

## 2020-03-05 DIAGNOSIS — N189 Chronic kidney disease, unspecified: Secondary | ICD-10-CM | POA: Insufficient documentation

## 2020-03-05 LAB — CMP (CANCER CENTER ONLY)
ALT: 20 U/L (ref 0–44)
AST: 30 U/L (ref 15–41)
Albumin: 3.6 g/dL (ref 3.5–5.0)
Alkaline Phosphatase: 29 U/L — ABNORMAL LOW (ref 38–126)
Anion gap: 11 (ref 5–15)
BUN: 23 mg/dL (ref 8–23)
CO2: 24 mmol/L (ref 22–32)
Calcium: 9.8 mg/dL (ref 8.9–10.3)
Chloride: 104 mmol/L (ref 98–111)
Creatinine: 1.94 mg/dL — ABNORMAL HIGH (ref 0.61–1.24)
GFR, Estimated: 37 mL/min — ABNORMAL LOW (ref >60)
Glucose, Bld: 135 mg/dL — ABNORMAL HIGH (ref 70–99)
Potassium: 4.3 mmol/L (ref 3.5–5.1)
Sodium: 139 mmol/L (ref 135–145)
Total Bilirubin: 0.5 mg/dL (ref 0.3–1.2)
Total Protein: 6.7 g/dL (ref 6.5–8.1)

## 2020-03-05 LAB — CBC WITH DIFFERENTIAL/PLATELET
Abs Immature Granulocytes: 0.02 10*3/uL (ref 0.00–0.07)
Basophils Absolute: 0 10*3/uL (ref 0.0–0.1)
Basophils Relative: 1 %
Eosinophils Absolute: 0.1 10*3/uL (ref 0.0–0.5)
Eosinophils Relative: 2 %
HCT: 28.6 % — ABNORMAL LOW (ref 39.0–52.0)
Hemoglobin: 9.8 g/dL — ABNORMAL LOW (ref 13.0–17.0)
Immature Granulocytes: 1 %
Lymphocytes Relative: 39 %
Lymphs Abs: 1.6 10*3/uL (ref 0.7–4.0)
MCH: 35.6 pg — ABNORMAL HIGH (ref 26.0–34.0)
MCHC: 34.3 g/dL (ref 30.0–36.0)
MCV: 104 fL — ABNORMAL HIGH (ref 80.0–100.0)
Monocytes Absolute: 0.4 10*3/uL (ref 0.1–1.0)
Monocytes Relative: 9 %
Neutro Abs: 2 10*3/uL (ref 1.7–7.7)
Neutrophils Relative %: 48 %
Platelets: 184 10*3/uL (ref 150–400)
RBC: 2.75 MIL/uL — ABNORMAL LOW (ref 4.22–5.81)
RDW: 13.4 % (ref 11.5–15.5)
WBC: 4 10*3/uL (ref 4.0–10.5)
nRBC: 0 % (ref 0.0–0.2)

## 2020-03-05 LAB — FERRITIN: Ferritin: 1170 ng/mL — ABNORMAL HIGH (ref 24–336)

## 2020-03-05 LAB — IRON AND TIBC
Iron: 139 ug/dL (ref 42–163)
Saturation Ratios: 54 % (ref 20–55)
TIBC: 260 ug/dL (ref 202–409)
UIBC: 121 ug/dL (ref 117–376)

## 2020-03-05 LAB — VITAMIN B12: Vitamin B-12: >7500 pg/mL — ABNORMAL HIGH (ref 180–914)

## 2020-03-05 NOTE — Progress Notes (Signed)
HEMATOLOGY/ONCOLOGY CLINIC NOTE  Date of Service: 03/05/2020  Patient Care Team: [name], MD as PCP - General (Family Medicine) [name], MD as Attending Physician (Ophthalmology) [name], MD as Attending Physician (Urology) [name], MD as Consulting Physician (Nephrology) Lyndee Hensen, PT as Physical Therapist (Physical Therapy)  CHIEF COMPLAINTS/PURPOSE OF CONSULTATION:  Anemia  HISTORY OF PRESENTING ILLNESS:   Mitchell Alvarado is a wonderful 69 y.o. male who has been referred to [place] by Dr Renato Shin for evaluation and management of anemia. The pt reports that he is doing well overall.   The pt reports that he has been taking monthly Vitamin B12 injections for the last 4-5 years, and his deficiency was first realized through routine blood work. He was taken off Metformin at the time. He has taken Omeprazole for 20 years for his Barrett's esophagus. He denies any food intolerances or thyroid problems. He notes that he has had some fatigue which he attributes to getting older.   He has seen a nephrologist several years ago and was discharged after replacing his Vitamin D satisfactorily and he denies any liver problems as well.   Most recent lab results (05/29/17) of CBC  is as follows: all values are WNL except for WBC at 3.5k, RBC at 3.04, HGB at 11.3, HCT at 32.9, MCV at 108.0.  Review of CBC w/diff over the last two years reveals stability in the above abnormalities.   On review of systems, pt reports some fatigue, stable weight, and denies problems of infection, bleeding, back pains, pain along the spine, abdominal pains, changes in bowel habits, leg selling, urinary discomfort, and any other symptoms.   On PMHx the pt reports neuropathy, prostate surgery in 2000, denies Afib. On Social Hx the pt reports 10-14 glasses of wine each week  and denies any concern for excessive consumption. He denies smoking and smoked cigarettes for one year a long time  ago  On Family Hx the pt reports paternal heart attack.  Interval History:   Mitchell Alvarado returns today for management and evaluation of his macrocytic anemia. The patient's last visit with [place] was on 09/05/2019. The pt reports that he is doing well overall.  The pt reports that he has been well and denies any new fatigue. Pt has been taking a B-complex and Vitamin B12 daily. He is currently drinking 1-2 glasses of wine per day on average. He has received the COVID19 and annual flu vaccines.   Lab results today (03/05/20) of CBC w/diff and CMP is as follows: all values are WNL except for RBC at 2.75, Hgb at 9.8, HCT at 28.6, MCV at 104.0, MCH at 35.6, Glucose at 135, Creatinine at 1.94, ALP at 29, GFR Est Afr Am at 34. 03/05/2020 Iron Panel is as follows: Iron at 139, TIBC at 260, Sat Ratios at 54, UIBC at 121.  03/05/2020 Ferritin at 1170 03/05/2020 MMP is in progress 03/05/2020 Vitamin B12 is in progress 03/05/2020 Testosterone is in progress 03/05/2020 Erythropoietin is in progress  On review of systems, pt denies fatigue, fevers, chills, night sweats, unexpected weight loss, abdominal pain and any other symptoms.   MEDICAL HISTORY:  Past Medical History:  Diagnosis Date  . ANEMIA, IRON DEFICIENCY 12/23/2009  . Barrett's esophagus without dysplasia 05/2012  . DIABETES MELLITUS, TYPE II 12/30/2006  . ECZEMA 05/15/2009  . GERD 12/30/2006  . HYPERLIPIDEMIA 12/30/2006  . HYPERTENSION 12/30/2006  . HYPERTHYROIDISM 06/03/2008  . Pernicious anemia 12/30/2006  . Personal  history of colonic polyps - adenoma 07/22/2004    SURGICAL HISTORY: Past Surgical History:  Procedure Laterality Date  . COLONOSCOPY  multiple  . ESOPHAGOGASTRODUODENOSCOPY  multiple  . TRANSURETHRAL RESECTION OF PROSTATE  2000    SOCIAL HISTORY: Social History   Socioeconomic History  . Marital status: Legally Separated    Spouse name: Not on file  . Number of children: Not on file  . Years of education: Not on  file  . Highest education level: Not on file  Occupational History  . Occupation: Retired from school system  Tobacco Use  . Smoking status: Former Research scientist (life sciences)  . Smokeless tobacco: Never Used  Substance and Sexual Activity  . Alcohol use: Yes    Alcohol/week: 7.0 standard drinks    Types: 7 Glasses of wine per week    Comment: 5-7 glasses wine weekly  . Drug use: No  . Sexual activity: Not on file  Other Topics Concern  . 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 Difficulty of Paying Living Expenses: Not hard at all  Food Insecurity: No Food Insecurity  . Worried About Charity fundraiser in the Last Year: Never true  . Ran Out of Food in the Last Year: Never true  Transportation Needs: No Transportation Needs  . Lack of Transportation (Medical): No  . Lack of Transportation (Non-Medical): No  Physical Activity: Insufficiently Active  . Days of Exercise per Week: 3 days  . Minutes of Exercise per Session: 20 min  Stress: No Stress Concern Present  . Feeling of Stress : Not at all  Social Connections: Socially Isolated  . Frequency of Communication with Friends and Family: More than three times a week  . Frequency of Social Gatherings with Friends and Family: More than three times a week  . Attends Religious Services: Never  . Active Member of Clubs or Organizations: No  . Attends Archivist Meetings: Never  . Marital Status: Separated  Intimate Partner Violence: Not At Risk  . Fear of Current or Ex-Partner: No  . Emotionally Abused: No  . Physically Abused: No  . Sexually Abused: No    FAMILY HISTORY: Family History  Problem Relation Age of Onset  . Heart attack Father        MI  . Depression Son   . COPD Son   . Cancer Neg Hx   . Colon  cancer Neg Hx     ALLERGIES:  has No Known Allergies.  MEDICATIONS:  Current Outpatient Medications  Medication Sig Dispense Refill  . aspirin 81 MG tablet Take 1 tablet (81 mg total) by mouth daily. 90 tablet 3  . calcitRIOL (ROCALTROL) 0.25 MCG capsule TAKE 1 CAPSULE (0.25 MCG TOTAL) BY MOUTH DAILY. TAKE MONDAY WEDNESDAY AND FRIDAY. 36 capsule 3  . carvedilol (COREG) 3.125 MG tablet TAKE 1 TABLET (3.125 MG TOTAL) BY MOUTH 2 (TWO) TIMES DAILY. 180 tablet 0  . Cholecalciferol (VITAMIN D) 2000 units CAPS Take 2,000 Units by mouth daily.    . cyanocobalamin (,VITAMIN B-12,) 1000 MCG/ML injection INJECT 1ML MONTHLY AS DIRECTED 3 mL 0  . ezetimibe-simvastatin (VYTORIN) 10-80 MG tablet TAKE 1 TABLET BY MOUTH EVERY DAY 90 tablet 0  . fenofibrate (TRICOR) 145  MG tablet TAKE 1 TABLET BY MOUTH EVERY DAY 90 tablet 0  . glucose blood (ONETOUCH VERIO) test strip 1 each by Other route daily. And lancets 1/day 100 each 3  . hydrochlorothiazide (HYDRODIURIL) 25 MG tablet TAKE 1 TABLET BY MOUTH EVERY DAY 90 tablet 0  . latanoprost (XALATAN) 0.005 % ophthalmic solution Place 1 drop into both eyes at bedtime.    Marland Kitchen omeprazole (PRILOSEC) 20 MG capsule TAKE 1 CAPSULE BY MOUTH EVERY DAY 90 capsule 3  . pioglitazone (ACTOS) 45 MG tablet TAKE 1 TABLET BY MOUTH EVERY DAY 90 tablet 0   No current facility-administered medications for this visit.    REVIEW OF SYSTEMS:   A 10+ POINT REVIEW OF SYSTEMS WAS OBTAINED including neurology, dermatology, psychiatry, cardiac, respiratory, lymph, extremities, GI, GU, Musculoskeletal, constitutional, breasts, reproductive, HEENT.  All pertinent positives are noted in the HPI.  All others are negative.   PHYSICAL EXAMINATION:  . Vitals:   03/05/20 1352  BP: (!) 169/78  Pulse: 90  Resp: 18  Temp: (!) 97.5 F (36.4 C)  SpO2: 100%   Filed Weights   03/05/20 1352  Weight: 268 lb (121.6 kg)   .Body mass index is 34.41 kg/m.   GENERAL:alert, in no acute distress and  comfortable SKIN: no acute rashes, no significant lesions EYES: conjunctiva are pink and non-injected, sclera anicteric OROPHARYNX: MMM, no exudates, no oropharyngeal erythema or ulceration NECK: supple, no JVD LYMPH:  no palpable lymphadenopathy in the cervical, axillary or inguinal regions LUNGS: clear to auscultation b/l with normal respiratory effort HEART: regular rate & rhythm ABDOMEN:  normoactive bowel sounds , non tender, not distended. No palpable hepatosplenomegaly.  Extremity: no pedal edema PSYCH: alert & oriented x 3 with fluent speech NEURO: no focal motor/sensory deficits  LABORATORY DATA:  I have reviewed the data as listed  . CBC Latest Ref Rng & Units 03/05/2020 09/05/2019 06/24/2019  WBC 4.0 - 10.5 K/uL 4.0 4.0 3.8(L)  Hemoglobin 13.0 - 17.0 g/dL 9.8(L) 10.1(L) 10.6(L)  Hematocrit 39.0 - 52.0 % 28.6(L) 29.5(L) 30.9(L)  Platelets 150 - 400 K/uL 184 178 196.0   . CBC    Component Value Date/Time   WBC 4.0 03/05/2020 1315   RBC 2.75 (L) 03/05/2020 1315   HGB 9.8 (L) 03/05/2020 1315   HCT 28.6 (L) 03/05/2020 1315   HCT 31.1 (L) 09/07/2017 1405   PLT 184 03/05/2020 1315   MCV 104.0 (H) 03/05/2020 1315   MCH 35.6 (H) 03/05/2020 1315   MCHC 34.3 03/05/2020 1315   RDW 13.4 03/05/2020 1315   LYMPHSABS 1.6 03/05/2020 1315   MONOABS 0.4 03/05/2020 1315   EOSABS 0.1 03/05/2020 1315   BASOSABS 0.0 03/05/2020 1315     . CMP Latest Ref Rng & Units 03/05/2020 09/05/2019 06/24/2019  Glucose 70 - 99 mg/dL 135(H) 121(H) -  BUN 8 - 23 mg/dL 23 23 -  Creatinine 0.61 - 1.24 mg/dL 1.94(H) 2.23(H) -  Sodium 135 - 145 mmol/L 139 138 -  Potassium 3.5 - 5.1 mmol/L 4.3 4.4 -  Chloride 98 - 111 mmol/L 104 103 -  CO2 22 - 32 mmol/L 24 27 -  Calcium 8.9 - 10.3 mg/dL 9.8 9.7 10.2  Total Protein 6.5 - 8.1 g/dL 6.7 6.4(L) -  Total Bilirubin 0.3 - 1.2 mg/dL 0.5 0.5 -  Alkaline Phos 38 - 126 U/L 29(L) 27(L) -  AST 15 - 41 U/L 30 18 -  ALT 0 - 44 U/L 20 16 -   Component  Latest Ref Rng & Units 09/07/2017  IgG (Immunoglobin G), Serum     700 - 1,600 mg/dL 823  IgA     61 - 437 mg/dL 248  IgM (Immunoglobulin M), Srm     20 - 172 mg/dL 143  Total Protein ELP     6.0 - 8.5 g/dL 6.7  Albumin SerPl Elph-Mcnc     2.9 - 4.4 g/dL 4.0  Alpha 1     0.0 - 0.4 g/dL 0.2  Alpha2 Glob SerPl Elph-Mcnc     0.4 - 1.0 g/dL 0.8  B-Globulin SerPl Elph-Mcnc     0.7 - 1.3 g/dL 1.1  Gamma Glob SerPl Elph-Mcnc     0.4 - 1.8 g/dL 0.6  M Protein SerPl Elph-Mcnc     Not Observed g/dL Not Observed  Globulin, Total     2.2 - 3.9 g/dL 2.7  Albumin/Glob SerPl     0.7 - 1.7 1.5  IFE 1      Comment  Please Note (HCV):      Comment  Folate, Hemolysate     Not Estab. ng/mL 204.0  HCT     37.5 - 51.0 % 31.1 (L)  Folate, RBC     >498 ng/mL 656  Vitamin B12     180 - 914 pg/mL 6,847 (H)  TSH     0.320 - 4.118 uIU/mL 1.016  Ferritin     22 - 316 ng/mL 867 (H)  Copper     72 - 166 ug/dL 97  Parietal Cell Antibody-IgG     0.0 - 20.0 Units 10.9  Intrinsic Factor     0.0 - 1.1 AU/mL 11.8 (H)    RADIOGRAPHIC STUDIES: I have personally reviewed the radiological images as listed and agreed with the findings in the report. No results found.  ASSESSMENT & PLAN:   69 y.o. male with  1. Macrocytic Anemia Review of patient's CBCs w/diff over the last two years reveals stability on the above abnormalities  Patient appears to have pernicious anemia with anti intrinsic factor antibodies. Currently no B12 or folate deficiency deficiency.but could have other B vit deficiencies. No monoclonal paraproteinemia. Concern for heavy ETOH use Has some CKD that could be contributing to the anemia as well. No known liver disease No overt bleeding noted.  2. B12 deficiency -multifactorial -- pernicious anemia + previous metformin use + current PPI use.  PLAN: -Discussed pt labwork today, 03/05/20; stable macrocytic anemia, WBC & PLT are nml, kidney numbers are up slightly, Sat  Ratios are WNL, Ferritin is well-replaced; MMP, Vitamin B12, Testosterone, and Erythropoietin are in progress. -Reported alcohol consumption would not cause anemia that we are seeing.  -Normal RDW suggests against a significant Vitamin B12 deficiency at this time.  -Advised pt that there can be genetic changes in the bone marrow with age.  -Advised pt that a BM Bx would be the only way to r/o a myelodysplastic change - pt prefers to wait at this time.  -Advised pt that low-grade MDS treatment is supportive in nature and there are otther treatment options for medium to higher grade MDS. -Advised pt that we would focus on optimizing Vitamin B12 replacement prior to ordering a BM Bx.  -Would keep Ferritin higher than normal due to CKD. Would also begin EPO replacement if necessary.  -Continue monthly Vitamin B12 injection -Continue daily Vitamin B complex  -Will see back in 6 months with labs, sooner if any new concerns.    3. . Patient Active  Problem List   Diagnosis Date Noted  . Hyperparathyroidism, secondary ([place]) 05/27/2016  . CKD stage 3 secondary to diabetes ([place]) 05/21/2012  . Iron deficiency anemia 12/23/2009  . ECZEMA 05/15/2009  . Diabetes ([place]) 12/30/2006  . Dyslipidemia and hypertriglyceridemia associated with type 2 diabetes mellitus ([place]) 12/30/2006  . PERNICIOUS ANEMIA 12/30/2006  . Hypertension associated with diabetes ([place]) 12/30/2006  . GERD 12/30/2006  . Personal history of colonic polyps - adenoma 07/22/2004  . Barrett's esophagus -less than 1 cm, does not meet current criteria for this diagnosis 01/24/2002  -Mx per PCP  FOLLOW UP: RTC with Dr Irene Limbo with labs in 6 months   The total time spent in the appt was 20 minutes and more than 50% was on counseling and direct patient cares.  All of the patient's questions were answered with apparent satisfaction. The patient knows to call the clinic with any problems, questions or concerns.   Sullivan Lone MD [place] AAHIVMS [facility]  [facility] Hematology/Oncology Physician [facility]  (Office):       [phone] (Work cell):  [phone] (Fax):           [phone]  03/05/2020 4:49 PM  I, Yevette Edwards, am acting as a scribe for Dr. Sullivan Lone.   .I have reviewed the above documentation for accuracy and completeness, and I agree with the above. Brunetta Genera MD

## 2020-03-06 LAB — ERYTHROPOIETIN: Erythropoietin: 19.1 m[IU]/mL — ABNORMAL HIGH (ref 2.6–18.5)

## 2020-03-06 LAB — TESTOSTERONE: Testosterone: 193 ng/dL — ABNORMAL LOW (ref 264–916)

## 2020-03-09 LAB — MULTIPLE MYELOMA PANEL, SERUM
Albumin SerPl Elph-Mcnc: 3.8 g/dL (ref 2.9–4.4)
Albumin/Glob SerPl: 1.6 (ref 0.7–1.7)
Alpha 1: 0.2 g/dL (ref 0.0–0.4)
Alpha2 Glob SerPl Elph-Mcnc: 0.7 g/dL (ref 0.4–1.0)
B-Globulin SerPl Elph-Mcnc: 0.9 g/dL (ref 0.7–1.3)
Gamma Glob SerPl Elph-Mcnc: 0.7 g/dL (ref 0.4–1.8)
Globulin, Total: 2.4 g/dL (ref 2.2–3.9)
IgA: 242 mg/dL (ref 61–437)
IgG (Immunoglobin G), Serum: 710 mg/dL (ref 603–1613)
IgM (Immunoglobulin M), Srm: 133 mg/dL (ref 20–172)
Total Protein ELP: 6.2 g/dL (ref 6.0–8.5)

## 2020-04-21 ENCOUNTER — Other Ambulatory Visit: Payer: Self-pay | Admitting: Family Medicine

## 2020-05-06 ENCOUNTER — Other Ambulatory Visit: Payer: Self-pay | Admitting: Family Medicine

## 2020-05-23 ENCOUNTER — Other Ambulatory Visit: Payer: Self-pay | Admitting: Family Medicine

## 2020-06-25 ENCOUNTER — Encounter: Payer: Self-pay | Admitting: Family Medicine

## 2020-06-25 ENCOUNTER — Other Ambulatory Visit: Payer: Self-pay

## 2020-06-25 ENCOUNTER — Ambulatory Visit (INDEPENDENT_AMBULATORY_CARE_PROVIDER_SITE_OTHER): Payer: Medicare PPO | Admitting: Family Medicine

## 2020-06-25 VITALS — BP 129/80 | HR 98 | Temp 97.9°F | Ht 74.0 in | Wt 270.0 lb

## 2020-06-25 DIAGNOSIS — E785 Hyperlipidemia, unspecified: Secondary | ICD-10-CM

## 2020-06-25 DIAGNOSIS — N2581 Secondary hyperparathyroidism of renal origin: Secondary | ICD-10-CM

## 2020-06-25 DIAGNOSIS — Z125 Encounter for screening for malignant neoplasm of prostate: Secondary | ICD-10-CM | POA: Diagnosis not present

## 2020-06-25 DIAGNOSIS — Z0001 Encounter for general adult medical examination with abnormal findings: Secondary | ICD-10-CM

## 2020-06-25 DIAGNOSIS — I152 Hypertension secondary to endocrine disorders: Secondary | ICD-10-CM

## 2020-06-25 DIAGNOSIS — Z6834 Body mass index (BMI) 34.0-34.9, adult: Secondary | ICD-10-CM | POA: Diagnosis not present

## 2020-06-25 DIAGNOSIS — E1159 Type 2 diabetes mellitus with other circulatory complications: Secondary | ICD-10-CM

## 2020-06-25 DIAGNOSIS — E1169 Type 2 diabetes mellitus with other specified complication: Secondary | ICD-10-CM | POA: Diagnosis not present

## 2020-06-25 DIAGNOSIS — E669 Obesity, unspecified: Secondary | ICD-10-CM | POA: Diagnosis not present

## 2020-06-25 DIAGNOSIS — E1122 Type 2 diabetes mellitus with diabetic chronic kidney disease: Secondary | ICD-10-CM | POA: Diagnosis not present

## 2020-06-25 DIAGNOSIS — N183 Chronic kidney disease, stage 3 unspecified: Secondary | ICD-10-CM | POA: Diagnosis not present

## 2020-06-25 DIAGNOSIS — E538 Deficiency of other specified B group vitamins: Secondary | ICD-10-CM | POA: Diagnosis not present

## 2020-06-25 LAB — MICROALBUMIN / CREATININE URINE RATIO
Creatinine,U: 171.9 mg/dL
Microalb Creat Ratio: 3.2 mg/g (ref 0.0–30.0)
Microalb, Ur: 5.5 mg/dL — ABNORMAL HIGH (ref 0.0–1.9)

## 2020-06-25 LAB — LIPID PANEL
Cholesterol: 218 mg/dL — ABNORMAL HIGH (ref 0–200)
HDL: 118 mg/dL (ref >39.00)
LDL Cholesterol: 83 mg/dL (ref 0–99)
NonHDL: 99.88
Total CHOL/HDL Ratio: 2
Triglycerides: 82 mg/dL (ref 0.0–149.0)
VLDL: 16.4 mg/dL (ref 0.0–40.0)

## 2020-06-25 LAB — CBC
HCT: 31.1 % — ABNORMAL LOW (ref 39.0–52.0)
Hemoglobin: 10.5 g/dL — ABNORMAL LOW (ref 13.0–17.0)
MCHC: 33.7 g/dL (ref 30.0–36.0)
MCV: 105.1 fl — ABNORMAL HIGH (ref 78.0–100.0)
Platelets: 231 10*3/uL (ref 150.0–400.0)
RBC: 2.96 Mil/uL — ABNORMAL LOW (ref 4.22–5.81)
RDW: 13.1 % (ref 11.5–15.5)
WBC: 5 10*3/uL (ref 4.0–10.5)

## 2020-06-25 LAB — COMPREHENSIVE METABOLIC PANEL
ALT: 16 U/L (ref 0–53)
AST: 21 U/L (ref 0–37)
Albumin: 4.3 g/dL (ref 3.5–5.2)
Alkaline Phosphatase: 26 U/L — ABNORMAL LOW (ref 39–117)
BUN: 24 mg/dL — ABNORMAL HIGH (ref 6–23)
CO2: 27 mEq/L (ref 19–32)
Calcium: 10.1 mg/dL (ref 8.4–10.5)
Chloride: 104 mEq/L (ref 96–112)
Creatinine, Ser: 1.89 mg/dL — ABNORMAL HIGH (ref 0.40–1.50)
GFR: 35.63 mL/min — ABNORMAL LOW (ref >60.00)
Glucose, Bld: 143 mg/dL — ABNORMAL HIGH (ref 70–99)
Potassium: 4.7 mEq/L (ref 3.5–5.1)
Sodium: 142 mEq/L (ref 135–145)
Total Bilirubin: 0.6 mg/dL (ref 0.2–1.2)
Total Protein: 6.8 g/dL (ref 6.0–8.3)

## 2020-06-25 LAB — TSH: TSH: 1.5 u[IU]/mL (ref 0.35–4.50)

## 2020-06-25 LAB — PSA: PSA: 0.18 ng/mL (ref 0.10–4.00)

## 2020-06-25 LAB — HEMOGLOBIN A1C: Hgb A1c MFr Bld: 6.2 % (ref 4.6–6.5)

## 2020-06-25 NOTE — Assessment & Plan Note (Signed)
Check A1c.  We stopped Actos about 6 months ago.  He has done well this.

## 2020-06-25 NOTE — Progress Notes (Signed)
Chief Complaint:  Mitchell Alvarado is a 70 y.o. male who presents today for his annual comprehensive physical exam.    Assessment/Plan:  Chronic Problems Addressed Today: Hypertension associated with diabetes ([place]) Check labs.  Continue Coreg 3.125 mg twice daily and HCTZ 25 mg daily.  Dyslipidemia and hypertriglyceridemia associated with type 2 diabetes mellitus ([place]) Check labs.  He is on Vytorin and fenofibrate.  We will continue both meds.  Diabetes ([facility]) Check A1c.  We stopped Actos about 6 months ago.  He has done well this.  CKD stage 3 secondary to diabetes ([place]) Check BMET.  Vitamin B12 deficiency Check B12 today.  We will start B12 injections as his last several values have been greater than 1500.  Will start oral supplementation.  We can recheck again in 3 to 6 months and restart injections as needed.   Hyperparathyroidism, secondary ([place]) Check PTH and calcium levels today.    Body mass index is 34.67 kg/m. / Obese  BMI Metric Follow Up - 06/25/20 1403      BMI Metric Follow Up-Please document annually   BMI Metric Follow Up Education provided            Preventative Healthcare: Check labs today.  Due for colon cancer screening later this year.  Up-to-date on other vaccines and screenings.  Patient Counseling(The following topics were reviewed and/or handout was given):  -Nutrition: Stressed importance of moderation in sodium/caffeine intake, saturated fat and cholesterol, caloric balance, sufficient intake of fresh fruits, vegetables, and fiber.  -Stressed the importance of regular exercise.   -Substance Abuse: Discussed cessation/primary prevention of tobacco, alcohol, or other drug use; driving or other dangerous activities under the influence; availability of treatment for abuse.   -Injury prevention: Discussed safety belts, safety helmets, smoke detector, smoking near bedding or upholstery.   -Sexuality: Discussed sexually transmitted diseases, partner  selection, use of condoms, avoidance of unintended pregnancy and contraceptive alternatives.   -Dental health: Discussed importance of regular tooth brushing, flossing, and dental visits.  -Health maintenance and immunizations reviewed. Please refer to Health maintenance section.  Return to care in 1 year for next preventative visit.     Subjective:  HPI:  He has no acute complaints today. See a/p for status of chronic conditions.   Lifestyle Diet: Balanced. Plenty of fruits and vegetables.  Exercise: Limited.   Depression screen PHQ 2/9 01/09/2020  Decreased Interest 0  Down, Depressed, Hopeless 0  PHQ - 2 Score 0  Altered sleeping -  Tired, decreased energy -  Change in appetite -  Feeling bad or failure about yourself  -  Trouble concentrating -  Moving slowly or fidgety/restless -  Suicidal thoughts -  PHQ-9 Score -    Health Maintenance Due  Topic Date Due  . FOOT EXAM  06/23/2020  . URINE MICROALBUMIN  06/23/2020  . HEMOGLOBIN A1C  06/24/2020     ROS: Per HPI, otherwise a complete review of systems was negative.   PMH:  The following were reviewed and entered/updated in epic: Past Medical History:  Diagnosis Date  . ANEMIA, IRON DEFICIENCY 12/23/2009  . Barrett's esophagus without dysplasia 05/2012  . DIABETES MELLITUS, TYPE II 12/30/2006  . ECZEMA 05/15/2009  . GERD 12/30/2006  . HYPERLIPIDEMIA 12/30/2006  . HYPERTENSION 12/30/2006  . HYPERTHYROIDISM 06/03/2008  . Pernicious anemia 12/30/2006  . Personal history of colonic polyps - adenoma 07/22/2004   Patient Active Problem List   Diagnosis Date Noted  . Vitamin B12 deficiency 06/25/2020  .  Hyperparathyroidism, secondary ([place]) 05/27/2016  . CKD stage 3 secondary to diabetes ([place]) 05/21/2012  . Iron deficiency anemia 12/23/2009  . ECZEMA 05/15/2009  . Diabetes ([place]) 12/30/2006  . Dyslipidemia and hypertriglyceridemia associated with type 2 diabetes mellitus ([place]) 12/30/2006  . Hypertension associated with  diabetes ([place]) 12/30/2006  . GERD 12/30/2006  . Personal history of colonic polyps - adenoma 07/22/2004  . Barrett's esophagus -less than 1 cm, does not meet current criteria for this diagnosis 01/24/2002   Past Surgical History:  Procedure Laterality Date  . COLONOSCOPY  multiple  . ESOPHAGOGASTRODUODENOSCOPY  multiple  . TRANSURETHRAL RESECTION OF PROSTATE  2000    Family History  Problem Relation Age of Onset  . Heart attack Father        MI  . Depression Son   . COPD Son   . Cancer Neg Hx   . Colon cancer Neg Hx     Medications- reviewed and updated Current Outpatient Medications  Medication Sig Dispense Refill  . aspirin 81 MG tablet Take 1 tablet (81 mg total) by mouth daily. 90 tablet 3  . calcitRIOL (ROCALTROL) 0.25 MCG capsule TAKE 1 CAPSULE (0.25 MCG TOTAL) BY MOUTH DAILY. TAKE MONDAY WEDNESDAY AND FRIDAY. 36 capsule 3  . carvedilol (COREG) 3.125 MG tablet TAKE 1 TABLET (3.125 MG TOTAL) BY MOUTH 2 (TWO) TIMES DAILY. 180 tablet 0  . Cholecalciferol (VITAMIN D) 2000 units CAPS Take 2,000 Units by mouth daily.    Marland Kitchen ezetimibe-simvastatin (VYTORIN) 10-80 MG tablet TAKE 1 TABLET BY MOUTH EVERY DAY 90 tablet 0  . fenofibrate (TRICOR) 145 MG tablet TAKE 1 TABLET BY MOUTH EVERY DAY 90 tablet 0  . glucose blood (ONETOUCH VERIO) test strip 1 each by Other route daily. And lancets 1/day 100 each 3  . hydrochlorothiazide (HYDRODIURIL) 25 MG tablet TAKE 1 TABLET BY MOUTH EVERY DAY 90 tablet 0  . latanoprost (XALATAN) 0.005 % ophthalmic solution Place 1 drop into both eyes at bedtime.    Marland Kitchen omeprazole (PRILOSEC) 20 MG capsule TAKE 1 CAPSULE BY MOUTH EVERY DAY 90 capsule 3   No current facility-administered medications for this visit.    Allergies-reviewed and updated No Known Allergies  Social History   Socioeconomic History  . Marital status: Legally Separated    Spouse name: Not on file  . Number of children: Not on file  . Years of education: Not on file  . Highest  education level: Not on file  Occupational History  . Occupation: Retired from school system  Tobacco Use  . Smoking status: Former Research scientist (life sciences)  . Smokeless tobacco: Never Used  Substance and Sexual Activity  . Alcohol use: Yes    Alcohol/week: 7.0 standard drinks    Types: 7 Glasses of wine per week    Comment: 5-7 glasses wine weekly  . Drug use: No  . Sexual activity: Not on file  Other Topics Concern  . 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 Difficulty of Paying Living Expenses: Not hard at all  Food Insecurity: No Food Insecurity  . Worried About Charity fundraiser in the Last Year: Never true  . Ran Out of Food in the Last Year: Never true  Transportation Needs: No Transportation Needs  .  Lack of Transportation (Medical): No  . Lack of Transportation (Non-Medical): No  Physical Activity: Insufficiently Active  . Days of Exercise per Week: 3 days  . Minutes of Exercise per Session: 20 min  Stress: No Stress Concern Present  . Feeling of Stress : Not at all  Social Connections: Socially Isolated  . Frequency of Communication with Friends and Family: More than three times a week  . Frequency of Social Gatherings with Friends and Family: More than three times a week  . Attends Religious Services: Never  . Active Member of Clubs or Organizations: No  . Attends Archivist Meetings: Never  . Marital Status: Separated        Objective:  Physical Exam: BP 129/80   Pulse 98   Temp 97.9 F (36.6 C) (Temporal)   Ht 6\' 2"  (1.88 m)   Wt 270 lb (122.5 kg)   SpO2 99%   BMI 34.67 kg/m   Body mass index is 34.67 kg/m. Wt Readings from Last 3 Encounters:  06/25/20 270 lb (122.5 kg)  03/05/20 268 lb (121.6 kg)  12/26/19 270 lb 12.8 oz (122.8 kg)    Gen: NAD, resting comfortably HEENT: TMs normal bilaterally. OP clear. No thyromegaly noted.  CV: RRR with no murmurs appreciated Pulm: NWOB, CTAB with no crackles, wheezes, or rhonchi GI: Normal bowel sounds present. Soft, Nontender, Nondistended. MSK: no edema, cyanosis, or clubbing noted Skin: warm, dry Neuro: CN2-12 grossly intact. Strength 5/5 in upper and lower extremities. Reflexes symmetric and intact bilaterally.  Psych: Normal affect and thought content     Zia Najera M. Jerline Pain, MD 06/25/2020 2:04 PM

## 2020-06-25 NOTE — Assessment & Plan Note (Signed)
Check labs.  He is on Vytorin and fenofibrate.  We will continue both meds.

## 2020-06-25 NOTE — Assessment & Plan Note (Addendum)
Check B12 today.  We will start B12 injections as his last several values have been greater than 1500.  Will start oral supplementation.  We can recheck again in 3 to 6 months and restart injections as needed.

## 2020-06-25 NOTE — Assessment & Plan Note (Signed)
Check BMET.

## 2020-06-25 NOTE — Assessment & Plan Note (Signed)
Check PTH and calcium levels today.

## 2020-06-25 NOTE — Assessment & Plan Note (Signed)
Check labs.  Continue Coreg 3.125 mg twice daily and HCTZ 25 mg daily.

## 2020-06-25 NOTE — Patient Instructions (Signed)
It was very nice to see you today!  We will check blood work today.  You are due for your colonoscopy later this year.  It is ok to stop your B12 injection. We will check today and again in 6 months. You cant take the over the counter supplementation if needed.   Take care, Dr Jerline Pain  PLEASE NOTE:  If you had any lab tests please let us know if you have not heard back within a few days. You may see your results on mychart before we have a chance to review them but we will give you a call once they are reviewed by [place]. If we ordered any referrals today, please let us know if you have not heard from their office within the next week.   Please try these tips to maintain a healthy lifestyle:   Eat at least 3 REAL meals and 1-2 snacks per day.  Aim for no more than 5 hours between eating.  If you eat breakfast, please do so within one hour of getting up.    Each meal should contain half fruits/vegetables, one quarter protein, and one quarter carbs (no bigger than a computer mouse)   Cut down on sweet beverages. This includes juice, soda, and sweet tea.     Drink at least 1 glass of water with each meal and aim for at least 8 glasses per day   Exercise at least 150 minutes every week.    Preventive Care 3 Years and Older, Male Preventive care refers to lifestyle choices and visits with your health care provider that can promote health and wellness. This includes:  A yearly physical exam. This is also called an annual wellness visit.  Regular dental and eye exams.  Immunizations.  Screening for certain conditions.  Healthy lifestyle choices, such as: ? Eating a healthy diet. ? Getting regular exercise. ? Not using drugs or products that contain nicotine and tobacco. ? Limiting alcohol use. What can I expect for my preventive care visit? Physical exam Your health care provider will check your:  Height and weight. These may be used to calculate your BMI (body mass index).  BMI is a measurement that tells if you are at a healthy weight.  Heart rate and blood pressure.  Body temperature.  Skin for abnormal spots. Counseling Your health care provider may ask you questions about your:  Past medical problems.  Family's medical history.  Alcohol, tobacco, and drug use.  Emotional well-being.  Home life and relationship well-being.  Sexual activity.  Diet, exercise, and sleep habits.  History of falls.  Memory and ability to understand (cognition).  Work and work Statistician.  Access to firearms. What immunizations do I need? Vaccines are usually given at various ages, according to a schedule. Your health care provider will recommend vaccines for you based on your age, medical history, and lifestyle or other factors, such as travel or where you work.   What tests do I need? Blood tests  Lipid and cholesterol levels. These may be checked every 5 years, or more often depending on your overall health.  Hepatitis C test.  Hepatitis B test. Screening  Lung cancer screening. You may have this screening every year starting at age 61 if you have a 30-pack-year history of smoking and currently smoke or have quit within the past 15 years.  Colorectal cancer screening. ? All adults should have this screening starting at age 53 and continuing until age 38. ? Your health care provider  may recommend screening at age 42 if you are at increased risk. ? You will have tests every 1-10 years, depending on your results and the type of screening test.  Prostate cancer screening. Recommendations will vary depending on your family history and other risks.  Genital exam to check for testicular cancer or hernias.  Diabetes screening. ? This is done by checking your blood sugar (glucose) after you have not eaten for a while (fasting). ? You may have this done every 1-3 years.  Abdominal aortic aneurysm (AAA) screening. You may need this if you are a current or  former smoker.  STD (sexually transmitted disease) testing, if you are at risk. Follow these instructions at home: Eating and drinking  Eat a diet that includes fresh fruits and vegetables, whole grains, lean protein, and low-fat dairy products. Limit your intake of foods with high amounts of sugar, saturated fats, and salt.  Take vitamin and mineral supplements as recommended by your health care provider.  Do not drink alcohol if your health care provider tells you not to drink.  If you drink alcohol: ? Limit how much you have to 0-2 drinks a day. ? Be aware of how much alcohol is in your drink. In the [place] one drink equals one 12 oz bottle of beer (355 mL), one 5 oz glass of wine (148 mL), or one 1 oz glass of hard liquor (44 mL).   Lifestyle  Take daily care of your teeth and gums. Brush your teeth every morning and night with fluoride toothpaste. Floss one time each day.  Stay active. Exercise for at least 30 minutes 5 or more days each week.  Do not use any products that contain nicotine or tobacco, such as cigarettes, e-cigarettes, and chewing tobacco. If you need help quitting, ask your health care provider.  Do not use drugs.  If you are sexually active, practice safe sex. Use a condom or other form of protection to prevent STIs (sexually transmitted infections).  Talk with your health care provider about taking a low-dose aspirin or statin.  Find healthy ways to cope with stress, such as: ? Meditation, yoga, or listening to music. ? Journaling. ? Talking to a trusted person. ? Spending time with friends and family. Safety  Always wear your seat belt while driving or riding in a vehicle.  Do not drive: ? If you have been drinking alcohol. Do not ride with someone who has been drinking. ? When you are tired or distracted. ? While texting.  Wear a helmet and other protective equipment during sports activities.  If you have firearms in your house, make sure you  follow all gun safety procedures. What's next?  Visit your health care provider once a year for an annual wellness visit.  Ask your health care provider how often you should have your eyes and teeth checked.  Stay up to date on all vaccines. This information is not intended to replace advice given to you by your health care provider. Make sure you discuss any questions you have with your health care provider. Document Revised: 12/11/2018 Document Reviewed: 03/08/2018 Elsevier Patient Education  2021 [employer].

## 2020-06-26 ENCOUNTER — Other Ambulatory Visit: Payer: Self-pay | Admitting: *Deleted

## 2020-06-26 ENCOUNTER — Other Ambulatory Visit (INDEPENDENT_AMBULATORY_CARE_PROVIDER_SITE_OTHER): Payer: Medicare PPO

## 2020-06-26 DIAGNOSIS — E538 Deficiency of other specified B group vitamins: Secondary | ICD-10-CM | POA: Diagnosis not present

## 2020-06-26 LAB — VITAMIN B12: Vitamin B-12: 652 pg/mL (ref 211–911)

## 2020-06-26 LAB — PTH, INTACT AND CALCIUM
Calcium: 10.2 mg/dL (ref 8.6–10.3)
PTH: 30 pg/mL (ref 16–77)

## 2020-06-26 NOTE — Addendum Note (Signed)
Addended by: Brandy Hale on: 06/26/2020 09:37 AM   Modules accepted: Orders

## 2020-06-26 NOTE — Progress Notes (Signed)
Please inform patient of the following:  Hi B12 level is normal. He can continue B!2 tablets as we discussed at his last office visit.  We can recheck in 3-6 months. Please place future orders if he is interested.  His A1c is up a bit but still below diabetic level.  All of his other labs are stable.  We can recheck in a year.  Algis Greenhouse. Jerline Pain, MD 06/26/2020 2:54 PM

## 2020-07-17 ENCOUNTER — Other Ambulatory Visit: Payer: Self-pay | Admitting: Family Medicine

## 2020-07-22 ENCOUNTER — Telehealth: Payer: Self-pay | Admitting: Hematology

## 2020-07-22 NOTE — Telephone Encounter (Signed)
R/S per 12/09 los, Patient aware.  

## 2020-07-30 ENCOUNTER — Other Ambulatory Visit: Payer: Self-pay | Admitting: Family Medicine

## 2020-08-02 ENCOUNTER — Other Ambulatory Visit: Payer: Self-pay | Admitting: Family Medicine

## 2020-08-03 ENCOUNTER — Other Ambulatory Visit: Payer: Self-pay | Admitting: Family Medicine

## 2020-08-23 ENCOUNTER — Other Ambulatory Visit: Payer: Self-pay | Admitting: Family Medicine

## 2020-09-03 ENCOUNTER — Other Ambulatory Visit: Payer: Medicare PPO

## 2020-09-03 ENCOUNTER — Ambulatory Visit: Payer: Medicare PPO | Admitting: Hematology

## 2020-09-21 ENCOUNTER — Other Ambulatory Visit: Payer: Medicare PPO

## 2020-09-21 ENCOUNTER — Ambulatory Visit: Payer: Medicare PPO | Admitting: Hematology

## 2020-10-01 NOTE — Progress Notes (Signed)
HEMATOLOGY/ONCOLOGY CLINIC NOTE  Date of Service: 10/02/2020  Patient Care Team: [name], MD as PCP - General (Family Medicine) [name], MD as Attending Physician (Ophthalmology) [name], MD (Inactive) as Attending Physician (Urology) [name], MD as Consulting Physician (Nephrology) Lyndee Hensen, PT as Physical Therapist (Physical Therapy)  CHIEF COMPLAINTS/PURPOSE OF CONSULTATION:  Anemia  HISTORY OF PRESENTING ILLNESS:   Mitchell Alvarado is a wonderful 70 y.o. male who has been referred to [place] by Dr Renato Shin for evaluation and management of anemia. The pt reports that he is doing well overall.   The pt reports that he has been taking monthly Vitamin B12 injections for the last 4-5 years, and his deficiency was first realized through routine blood work. He was taken off Metformin at the time. He has taken Omeprazole for 20 years for his Barrett's esophagus. He denies any food intolerances or thyroid problems. He notes that he has had some fatigue which he attributes to getting older.   He has seen a nephrologist several years ago and was discharged after replacing his Vitamin D satisfactorily and he denies any liver problems as well.   Most recent lab results (05/29/17) of CBC  is as follows: all values are WNL except for WBC at 3.5k, RBC at 3.04, HGB at 11.3, HCT at 32.9, MCV at 108.0.  Review of CBC w/diff over the last two years reveals stability in the above abnormalities.   On review of systems, pt reports some fatigue, stable weight, and denies problems of infection, bleeding, back pains, pain along the spine, abdominal pains, changes in bowel habits, leg selling, urinary discomfort, and any other symptoms.   On PMHx the pt reports neuropathy, prostate surgery in 2000, denies Afib. On Social Hx the pt reports 10-14 glasses of wine each week  and denies any concern for excessive consumption. He denies smoking and smoked cigarettes for one year a  long time ago  On Family Hx the pt reports paternal heart attack.  Interval History:   Mitchell Alvarado returns today for management and evaluation of his macrocytic anemia. The patient's last visit with [place] was on 03/05/2020. The pt reports that he is doing well overall.  The pt reports that he has been feeling well over the last seven months. He was switched his Vitamin B shots for a pill he swallows.   Lab results today 10/02/2020 of CBC w/diff and CMP is as follows: all values are WNL except for RBC of 2.70, Hgb of 9.4, HCT of 27.6, MCV of 102.2, MCH of 34.8, Glucose of 132, BUN of 24, Creatinine of 1.85, Alkaline Phosphatase of 26, GFR est of 39. 10/02/2020 Immature Retic Fract of 21.3. 10/02/2020 Ferritin . Lab Results  Component Value Date   IRON 139 03/05/2020   TIBC 260 03/05/2020   IRONPCTSAT 54 03/05/2020   (Iron and TIBC)  Lab Results  Component Value Date   FERRITIN 1,112 (H) 10/02/2020   10/02/2020 Vitamin B12 918 10/02/2020 LDH 152   On review of systems, pt denies fatigue, back pain, abdominal pain, new lumps/bumps, leg swelling, sudden weight loss, fevers, chills, night sweats, and any other symptoms.   MEDICAL HISTORY:  Past Medical History:  Diagnosis Date   ANEMIA, IRON DEFICIENCY 12/23/2009   Barrett's esophagus without dysplasia 05/2012   DIABETES MELLITUS, TYPE II 12/30/2006   ECZEMA 05/15/2009   GERD 12/30/2006   HYPERLIPIDEMIA 12/30/2006   HYPERTENSION 12/30/2006   HYPERTHYROIDISM 06/03/2008   Pernicious anemia  12/30/2006   Personal history of colonic polyps - adenoma 07/22/2004    SURGICAL HISTORY: Past Surgical History:  Procedure Laterality Date   COLONOSCOPY  multiple   ESOPHAGOGASTRODUODENOSCOPY  multiple   TRANSURETHRAL RESECTION OF PROSTATE  2000    SOCIAL HISTORY: Social History   Socioeconomic History   Marital status: Legally Separated    Spouse name: Not on file   Number of children: Not on file   Years of education: Not on file    Highest education level: Not on file  Occupational History   Occupation: Retired from school system  Tobacco Use   Smoking status: Former    Pack years: 0.00   Smokeless tobacco: Never  Substance and Sexual Activity   Alcohol use: Yes    Alcohol/week: 7.0 standard drinks    Types: 7 Glasses of wine per week    Comment: 5-7 glasses wine weekly   Drug use: No   Sexual activity: Not on file  Other Topics Concern   Not on file  Social History Narrative   Married   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 [employer]   7 drinks/week   Former smoker   No drug use   Social Determinants of Radio broadcast assistant Strain: Low Risk    Difficulty of Paying Living Expenses: Not hard at [employer] Insecurity: No Food Insecurity   Worried About Charity fundraiser in the Last Year: Never true   Arboriculturist in the Last Year: Never true  Transportation Needs: No Transportation Needs   Lack of Transportation (Medical): No   Lack of Transportation (Non-Medical): No  Physical Activity: Insufficiently Active   Days of Exercise per Week: 3 days   Minutes of Exercise per Session: 20 min  Stress: No Stress Concern Present   Feeling of Stress : Not at all  Social Connections: Socially Isolated   Frequency of Communication with Friends and Family: More than three times a week   Frequency of Social Gatherings with Friends and Family: More than three times a week   Attends Religious Services: Never   Marine scientist or Organizations: No   Attends Music therapist: Never   Marital Status: Separated  Human resources officer Violence: Not At Risk   Fear of Current or Ex-Partner: No   Emotionally Abused: No   Physically Abused: No   Sexually Abused: No    FAMILY HISTORY: Family History  Problem Relation Age of Onset   Heart attack Father        MI   Depression Son    COPD Son    Cancer Neg Hx    Colon cancer Neg Hx      ALLERGIES:  has No Known Allergies.  MEDICATIONS:  Current Outpatient Medications  Medication Sig Dispense Refill   vitamin B-12 (CYANOCOBALAMIN) 1000 MCG tablet Take 1,000 mcg by mouth daily.     aspirin 81 MG tablet Take 1 tablet (81 mg total) by mouth daily. 90 tablet 3   calcitRIOL (ROCALTROL) 0.25 MCG capsule TAKE 1 CAPSULE (0.25 MCG TOTAL) BY MOUTH DAILY. TAKE MONDAY WEDNESDAY AND FRIDAY. 36 capsule 3   carvedilol (COREG) 3.125 MG tablet TAKE 1 TABLET BY MOUTH 2 TIMES DAILY. 180 tablet 0   Cholecalciferol (VITAMIN D) 2000 units CAPS Take 2,000 Units by mouth daily.     ezetimibe-simvastatin (VYTORIN) 10-80 MG tablet TAKE 1 TABLET BY MOUTH EVERY DAY 90 tablet 0  fenofibrate (TRICOR) 145 MG tablet TAKE 1 TABLET BY MOUTH EVERY DAY 90 tablet 0   glucose blood (ONETOUCH VERIO) test strip 1 each by Other route daily. And lancets 1/day 100 each 3   hydrochlorothiazide (HYDRODIURIL) 25 MG tablet TAKE 1 TABLET BY MOUTH EVERY DAY 90 tablet 0   latanoprost (XALATAN) 0.005 % ophthalmic solution Place 1 drop into both eyes at bedtime.     omeprazole (PRILOSEC) 20 MG capsule TAKE 1 CAPSULE BY MOUTH EVERY DAY 90 capsule 3   No current facility-administered medications for this visit.    REVIEW OF SYSTEMS:   10 Point review of Systems was done is negative except as noted above.  PHYSICAL EXAMINATION:  . Vitals:   10/02/20 1239  BP: (!) 146/66  Pulse: 83  Resp: 18  Temp: 98.7 F (37.1 C)  SpO2: 100%    Filed Weights   10/02/20 1239  Weight: 272 lb 14.4 oz (123.8 kg)    .Body mass index is 35.04 kg/m.   NAD. GENERAL:alert, in no acute distress and comfortable SKIN: no acute rashes, no significant lesions EYES: conjunctiva are pink and non-injected, sclera anicteric OROPHARYNX: MMM, no exudates, no oropharyngeal erythema or ulceration NECK: supple, no JVD LYMPH:  no palpable lymphadenopathy in the cervical, axillary or inguinal regions LUNGS: clear to auscultation b/l  with normal respiratory effort HEART: regular rate & rhythm ABDOMEN:  normoactive bowel sounds , non tender, not distended. No palpable hepatosplenomegaly.  Extremity: no pedal edema PSYCH: alert & oriented x 3 with fluent speech NEURO: no focal motor/sensory deficits  LABORATORY DATA:  I have reviewed the data as listed . CBC Latest Ref Rng & Units 10/02/2020 06/25/2020 03/05/2020  WBC 4.0 - 10.5 K/uL 4.4 5.0 4.0  Hemoglobin 13.0 - 17.0 g/dL 9.4(L) 10.5(L) 9.8(L)  Hematocrit 39.0 - 52.0 % 27.6(L) 31.1(L) 28.6(L)  Platelets 150 - 400 K/uL 220 231.0 184   .CBC    Component Value Date/Time   WBC 4.4 10/02/2020 1159   RBC 2.69 (L) 10/02/2020 1159   RBC 2.70 (L) 10/02/2020 1159   HGB 9.4 (L) 10/02/2020 1159   HCT 27.6 (L) 10/02/2020 1159   HCT 31.1 (L) 09/07/2017 1405   PLT 220 10/02/2020 1159   MCV 102.2 (H) 10/02/2020 1159   MCH 34.8 (H) 10/02/2020 1159   MCHC 34.1 10/02/2020 1159   RDW 12.9 10/02/2020 1159   LYMPHSABS 1.7 10/02/2020 1159   MONOABS 0.4 10/02/2020 1159   EOSABS 0.2 10/02/2020 1159   BASOSABS 0.0 10/02/2020 1159    . CMP Latest Ref Rng & Units 10/02/2020 06/25/2020 06/25/2020  Glucose 70 - 99 mg/dL 132(H) - 143(H)  BUN 8 - 23 mg/dL 24(H) - 24(H)  Creatinine 0.61 - 1.24 mg/dL 1.85(H) - 1.89(H)  Sodium 135 - 145 mmol/L 139 - 142  Potassium 3.5 - 5.1 mmol/L 4.6 - 4.7  Chloride 98 - 111 mmol/L 104 - 104  CO2 22 - 32 mmol/L 26 - 27  Calcium 8.9 - 10.3 mg/dL 9.8 10.2 10.1  Total Protein 6.5 - 8.1 g/dL 6.7 - 6.8  Total Bilirubin 0.3 - 1.2 mg/dL 0.4 - 0.6  Alkaline Phos 38 - 126 U/L 26(L) - 26(L)  AST 15 - 41 U/L 21 - 21  ALT 0 - 44 U/L 17 - 16     Component     Latest Ref Rng & Units 09/07/2017  IgG (Immunoglobin G), Serum     700 - 1,600 mg/dL 823  IgA  61 - 437 mg/dL 248  IgM (Immunoglobulin M), Srm     20 - 172 mg/dL 143  Total Protein ELP     6.0 - 8.5 g/dL 6.7  Albumin SerPl Elph-Mcnc     2.9 - 4.4 g/dL 4.0  Alpha 1     0.0 - 0.4 g/dL 0.2   Alpha2 Glob SerPl Elph-Mcnc     0.4 - 1.0 g/dL 0.8  B-Globulin SerPl Elph-Mcnc     0.7 - 1.3 g/dL 1.1  Gamma Glob SerPl Elph-Mcnc     0.4 - 1.8 g/dL 0.6  M Protein SerPl Elph-Mcnc     Not Observed g/dL Not Observed  Globulin, Total     2.2 - 3.9 g/dL 2.7  Albumin/Glob SerPl     0.7 - 1.7 1.5  IFE 1      Comment  Please Note (HCV):      Comment  Folate, Hemolysate     Not Estab. ng/mL 204.0  HCT     37.5 - 51.0 % 31.1 (L)  Folate, RBC     >498 ng/mL 656  Vitamin B12     180 - 914 pg/mL 6,847 (H)  TSH     0.320 - 4.118 uIU/mL 1.016  Ferritin     22 - 316 ng/mL 867 (H)  Copper     72 - 166 ug/dL 97  Parietal Cell Antibody-IgG     0.0 - 20.0 Units 10.9  Intrinsic Factor     0.0 - 1.1 AU/mL 11.8 (H)    RADIOGRAPHIC STUDIES: I have personally reviewed the radiological images as listed and agreed with the findings in the report. No results found.  ASSESSMENT & PLAN:   70 y.o. male with  1. Macrocytic Anemia - multifactorial -- pernicious anemia with anti IF antibodies + Anemia from CKD + cannot r/o an element of low grade MDS ? ? ETOH use  , Low testosterone levels Review of patient's CBCs w/diff over the last two years reveals stability on the above abnormalities  Patient appears to have pernicious anemia with anti intrinsic factor antibodies. Currently no B12 or folate deficiency deficiency.but could have other B vit deficiencies. No monoclonal paraproteinemia. Concern for ETOH use Has some CKD that could be contributing to the anemia as well. No known liver disease No overt bleeding noted.  2. B12 deficiency -multifactorial -- pernicious anemia + previous metformin use + current PPI use.  PLAN: -Discussed pt labwork today, 10/02/2020; Hgb slightly decreased, other counts stable, chemistries stable given CKD. Other labs pending. -Advised pt that replacing testosterone levels could improve Hgb as well. Discuss this w PCP if he chooses this option. -Recommended  pt switch to SL Vitamin B12 instead of the oral pill due to absorption issues. 1000-2000 mcg daily. -Re-discussed option of getting a Bm Bx to rule out MDS. Would recommend this if Hgb keeps decreasing. The pt prefers to continue to monitor. -Advised pt again to minimize alcohol intake. -Would keep Ferritin higher than normal due to CKD. Ferritin levels are adequate at this time. Would also begin EPO replacement if necessary hgb closer to 8 or less. -Continue daily Vitamin B complex  -Will see back in 6 months with labs.   3. . Patient Active Problem List   Diagnosis Date Noted   Vitamin B12 deficiency 06/25/2020   Hyperparathyroidism, secondary ([place]) 05/27/2016   CKD stage 3 secondary to diabetes ([place]) 05/21/2012   Iron deficiency anemia 12/23/2009   ECZEMA 05/15/2009   Diabetes ([place]) 12/30/2006  Dyslipidemia and hypertriglyceridemia associated with type 2 diabetes mellitus ([place]) 12/30/2006   Hypertension associated with diabetes ([place]) 12/30/2006   GERD 12/30/2006   Personal history of colonic polyps - adenoma 07/22/2004   Barrett's esophagus -less than 1 cm, does not meet current criteria for this diagnosis 01/24/2002  -Mx per PCP   FOLLOW UP: RTC with Dr Irene Limbo with labs in 6 months    The total time spent in the appt was 25 minutes and more than 50% was on counseling and direct patient cares.  All of the patient's questions were answered with apparent satisfaction. The patient knows to call the clinic with any problems, questions or concerns.   Sullivan Lone MD [place] AAHIVMS [facility] Hematology/Oncology [facility]  (Office):       [phone] (Work cell):  [phone] (Fax):           [phone]  10/02/2020 1:09 PM  I, Reinaldo Raddle, am acting as scribe for Dr. Sullivan Lone, MD.   .I have reviewed the above documentation for accuracy and completeness, and I agree with the above. Brunetta Genera MD

## 2020-10-02 ENCOUNTER — Other Ambulatory Visit: Payer: Self-pay

## 2020-10-02 ENCOUNTER — Inpatient Hospital Stay: Payer: Medicare PPO | Admitting: Hematology

## 2020-10-02 ENCOUNTER — Telehealth: Payer: Self-pay | Admitting: Hematology

## 2020-10-02 ENCOUNTER — Inpatient Hospital Stay: Payer: Medicare PPO | Attending: Hematology

## 2020-10-02 VITALS — BP 146/66 | HR 83 | Temp 98.7°F | Resp 18 | Wt 272.9 lb

## 2020-10-02 DIAGNOSIS — D631 Anemia in chronic kidney disease: Secondary | ICD-10-CM | POA: Diagnosis not present

## 2020-10-02 DIAGNOSIS — D539 Nutritional anemia, unspecified: Secondary | ICD-10-CM

## 2020-10-02 DIAGNOSIS — E538 Deficiency of other specified B group vitamins: Secondary | ICD-10-CM | POA: Diagnosis not present

## 2020-10-02 DIAGNOSIS — N189 Chronic kidney disease, unspecified: Secondary | ICD-10-CM | POA: Diagnosis not present

## 2020-10-02 DIAGNOSIS — D510 Vitamin B12 deficiency anemia due to intrinsic factor deficiency: Secondary | ICD-10-CM

## 2020-10-02 LAB — CMP (CANCER CENTER ONLY)
ALT: 17 U/L (ref 0–44)
AST: 21 U/L (ref 15–41)
Albumin: 3.5 g/dL (ref 3.5–5.0)
Alkaline Phosphatase: 26 U/L — ABNORMAL LOW (ref 38–126)
Anion gap: 9 (ref 5–15)
BUN: 24 mg/dL — ABNORMAL HIGH (ref 8–23)
CO2: 26 mmol/L (ref 22–32)
Calcium: 9.8 mg/dL (ref 8.9–10.3)
Chloride: 104 mmol/L (ref 98–111)
Creatinine: 1.85 mg/dL — ABNORMAL HIGH (ref 0.61–1.24)
GFR, Estimated: 39 mL/min — ABNORMAL LOW (ref >60)
Glucose, Bld: 132 mg/dL — ABNORMAL HIGH (ref 70–99)
Potassium: 4.6 mmol/L (ref 3.5–5.1)
Sodium: 139 mmol/L (ref 135–145)
Total Bilirubin: 0.4 mg/dL (ref 0.3–1.2)
Total Protein: 6.7 g/dL (ref 6.5–8.1)

## 2020-10-02 LAB — CBC WITH DIFFERENTIAL/PLATELET
Abs Immature Granulocytes: 0.01 10*3/uL (ref 0.00–0.07)
Basophils Absolute: 0 10*3/uL (ref 0.0–0.1)
Basophils Relative: 1 %
Eosinophils Absolute: 0.2 10*3/uL (ref 0.0–0.5)
Eosinophils Relative: 3 %
HCT: 27.6 % — ABNORMAL LOW (ref 39.0–52.0)
Hemoglobin: 9.4 g/dL — ABNORMAL LOW (ref 13.0–17.0)
Immature Granulocytes: 0 %
Lymphocytes Relative: 40 %
Lymphs Abs: 1.7 10*3/uL (ref 0.7–4.0)
MCH: 34.8 pg — ABNORMAL HIGH (ref 26.0–34.0)
MCHC: 34.1 g/dL (ref 30.0–36.0)
MCV: 102.2 fL — ABNORMAL HIGH (ref 80.0–100.0)
Monocytes Absolute: 0.4 10*3/uL (ref 0.1–1.0)
Monocytes Relative: 10 %
Neutro Abs: 2 10*3/uL (ref 1.7–7.7)
Neutrophils Relative %: 46 %
Platelets: 220 10*3/uL (ref 150–400)
RBC: 2.7 MIL/uL — ABNORMAL LOW (ref 4.22–5.81)
RDW: 12.9 % (ref 11.5–15.5)
WBC: 4.4 10*3/uL (ref 4.0–10.5)
nRBC: 0 % (ref 0.0–0.2)

## 2020-10-02 LAB — FERRITIN: Ferritin: 1112 ng/mL — ABNORMAL HIGH (ref 24–336)

## 2020-10-02 LAB — RETICULOCYTES
Immature Retic Fract: 21.3 % — ABNORMAL HIGH (ref 2.3–15.9)
RBC.: 2.69 MIL/uL — ABNORMAL LOW (ref 4.22–5.81)
Retic Count, Absolute: 37.7 10*3/uL (ref 19.0–186.0)
Retic Ct Pct: 1.4 % (ref 0.4–3.1)

## 2020-10-02 LAB — VITAMIN B12: Vitamin B-12: 918 pg/mL — ABNORMAL HIGH (ref 180–914)

## 2020-10-02 LAB — LACTATE DEHYDROGENASE: LDH: 152 U/L (ref 98–192)

## 2020-10-02 NOTE — Telephone Encounter (Signed)
Scheduled per los. Declined printout  

## 2020-10-29 ENCOUNTER — Other Ambulatory Visit: Payer: Self-pay | Admitting: Family Medicine

## 2020-10-30 ENCOUNTER — Other Ambulatory Visit: Payer: Self-pay | Admitting: Family Medicine

## 2020-11-21 ENCOUNTER — Other Ambulatory Visit: Payer: Self-pay | Admitting: Family Medicine

## 2020-11-24 ENCOUNTER — Encounter: Payer: Self-pay | Admitting: Internal Medicine

## 2020-12-17 ENCOUNTER — Encounter: Payer: Self-pay | Admitting: Family Medicine

## 2021-01-14 ENCOUNTER — Ambulatory Visit (INDEPENDENT_AMBULATORY_CARE_PROVIDER_SITE_OTHER): Payer: Medicare PPO

## 2021-01-14 DIAGNOSIS — Z0000 Encounter for general adult medical examination without abnormal findings: Secondary | ICD-10-CM | POA: Diagnosis not present

## 2021-01-14 NOTE — Progress Notes (Signed)
Virtual Visit via Telephone Note  I connected with  Mitchell Alvarado on 01/14/21 at  1:00 PM EDT by telephone and verified that I am speaking with the correct person using two identifiers.  Medicare Annual Wellness visit completed telephonically due to Covid-19 pandemic.   Persons participating in this call: This Health Coach and this patient.   Location: Patient: Home Provider: Office   I discussed the limitations, risks, security and privacy concerns of performing an evaluation and management service by telephone and the availability of in person appointments. The patient expressed understanding and agreed to proceed.  Unable to perform video visit due to video visit attempted and failed and/or patient does not have video capability.   Some vital signs may be absent or patient reported.   Willette Brace, LPN   Subjective:   Mitchell Alvarado is a 70 y.o. male who presents for Medicare Annual/Subsequent preventive examination.  Review of Systems     Cardiac Risk Factors include: advanced age (>35men, >28 women);hypertension;diabetes mellitus;dyslipidemia;male gender;obesity (BMI >30kg/m2)     Objective:    There were no vitals filed for this visit. There is no height or weight on file to calculate BMI.  Advanced Directives 01/14/2021 01/09/2020 09/05/2019 07/11/2019 12/06/2018 05/29/2017 11/29/2016  Does Patient Have a Medical Advance Directive? Yes Yes No No Yes No Yes  Type of Paramedic of [place];Living will - - Living will - [place];Living will  Does patient want to make changes to medical advance directive? - - - - No - Patient declined - -  Copy of [place] in Chart? Yes - validated most recent copy scanned in chart (See row information) No - copy requested - - - - Yes  Would patient like information on creating a medical advance directive? - - No - Patient declined - - - -     Current Medications (verified) Outpatient Encounter Medications as of 01/14/2021  Medication Sig   aspirin 81 MG tablet Take 1 tablet (81 mg total) by mouth daily.   calcitRIOL (ROCALTROL) 0.25 MCG capsule TAKE 1 CAPSULE (0.25 MCG TOTAL) BY MOUTH DAILY. TAKE MONDAY WEDNESDAY AND FRIDAY.   carvedilol (COREG) 3.125 MG tablet TAKE 1 TABLET BY MOUTH TWICE A DAY   Cholecalciferol (VITAMIN D) 2000 units CAPS Take 2,000 Units by mouth daily.   ezetimibe-simvastatin (VYTORIN) 10-80 MG tablet Take 1 tablet by mouth daily.   fenofibrate (TRICOR) 145 MG tablet TAKE 1 TABLET BY MOUTH EVERY DAY   glucose blood (ONETOUCH VERIO) test strip 1 each by Other route daily. And lancets 1/day   hydrochlorothiazide (HYDRODIURIL) 25 MG tablet TAKE 1 TABLET BY MOUTH EVERY DAY   latanoprost (XALATAN) 0.005 % ophthalmic solution Place 1 drop into both eyes at bedtime.   omeprazole (PRILOSEC) 20 MG capsule TAKE 1 CAPSULE BY MOUTH EVERY DAY   vitamin B-12 (CYANOCOBALAMIN) 1000 MCG tablet Take 1,000 mcg by mouth daily.   [DISCONTINUED] ezetimibe-simvastatin (VYTORIN) 10-80 MG tablet TAKE 1 TABLET BY MOUTH EVERY DAY (Patient not taking: Reported on 01/14/2021)   No facility-administered encounter medications on file as of 01/14/2021.    Allergies (verified) Patient has no known allergies.   History: Past Medical History:  Diagnosis Date   ANEMIA, IRON DEFICIENCY 12/23/2009   Barrett's esophagus without dysplasia 05/2012   DIABETES MELLITUS, TYPE II 12/30/2006   ECZEMA 05/15/2009   GERD 12/30/2006   HYPERLIPIDEMIA 12/30/2006   HYPERTENSION 12/30/2006   HYPERTHYROIDISM  06/03/2008   Pernicious anemia 12/30/2006   Personal history of colonic polyps - adenoma 07/22/2004   Past Surgical History:  Procedure Laterality Date   COLONOSCOPY  multiple   ESOPHAGOGASTRODUODENOSCOPY  multiple   TRANSURETHRAL RESECTION OF PROSTATE  2000   Family History  Problem Relation Age of Onset   Heart attack Father        MI    Depression Son    COPD Son    Cancer Neg Hx    Colon cancer Neg Hx    Social History   Socioeconomic History   Marital status: Legally Separated    Spouse name: Not on file   Number of children: Not on file   Years of education: Not on file   Highest education level: Not on file  Occupational History   Occupation: Retired from school system  Tobacco Use   Smoking status: Former   Smokeless tobacco: Never  Substance and Sexual Activity   Alcohol use: Yes    Alcohol/week: 7.0 standard drinks    Types: 7 Glasses of wine per week    Comment: 5-7 glasses wine weekly   Drug use: No   Sexual activity: Not on file  Other Topics Concern   Not on file  Social History Narrative   Married   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 [employer]   7 drinks/week   Former smoker   No drug use   Social Determinants of Radio broadcast assistant Strain: Low Risk    Difficulty of Paying Living Expenses: Not hard at [employer] Insecurity: No Food Insecurity   Worried About Charity fundraiser in the Last Year: Never true   Arboriculturist in the Last Year: Never true  Transportation Needs: No Transportation Needs   Lack of Transportation (Medical): No   Lack of Transportation (Non-Medical): No  Physical Activity: Insufficiently Active   Days of Exercise per Week: 3 days   Minutes of Exercise per Session: 20 min  Stress: No Stress Concern Present   Feeling of Stress : Not at all  Social Connections: Socially Isolated   Frequency of Communication with Friends and Family: Three times a week   Frequency of Social Gatherings with Friends and Family: Twice a week   Attends Religious Services: Never   Marine scientist or Organizations: No   Attends Music therapist: Never   Marital Status: Separated    Tobacco Counseling Counseling given: Not Answered   Clinical Intake:  Pre-visit preparation completed: Yes  Pain :  No/denies pain     BMI - recorded: 35.04 Nutritional Status: BMI > 30  Obese Nutritional Risks: None Diabetes: Yes CBG done?: No Did pt. bring in CBG monitor from home?: No  How often do you need to have someone help you when you read instructions, pamphlets, or other written materials from your doctor or pharmacy?: 1 - Never  Diabetic?Nutrition Risk Assessment:  Has the patient had any N/V/D within the last 2 months?  No  Does the patient have any non-healing wounds?  No  Has the patient had any unintentional weight loss or weight gain?  No   Diabetes:  Is the patient diabetic?  Yes  If diabetic, was a CBG obtained today?  No  Did the patient bring in their glucometer from home?  No  How often do you monitor your CBG's? N/a.   Financial Strains and Diabetes Management:  Are  you having any financial strains with the device, your supplies or your medication? No .  Does the patient want to be seen by Chronic Care Management for management of their diabetes?  No  Would the patient like to be referred to a Nutritionist or for Diabetic Management?  No   Diabetic Exams:  Diabetic Eye Exam: Completed 01/24/20 Diabetic Foot Exam: Overdue, Pt has been advised about the importance in completing this exam. Pt is scheduled for diabetic foot exam on next appt .   Interpreter Needed?: No  Information entered by :: Everlean Alstrom, LPN   Activities of Daily Living In your present state of health, do you have any difficulty performing the following activities: 01/14/2021  Hearing? N  Vision? N  Difficulty concentrating or making decisions? N  Walking or climbing stairs? Y  Comment has to use hand rails or it can be difficult  Dressing or bathing? N  Doing errands, shopping? N  Preparing Food and eating ? N  Using the Toilet? N  In the past six months, have you accidently leaked urine? N  Do you have problems with loss of bowel control? N  Managing your Medications? N   Managing your Finances? N  Housekeeping or managing your Housekeeping? N  Some recent data might be hidden    Patient Care Team: [name], MD as PCP - General (Family Medicine) [name], MD as Attending Physician (Ophthalmology) [name], MD (Inactive) as Attending Physician (Urology) [name], MD as Consulting Physician (Nephrology) Lyndee Hensen, PT as Physical Therapist (Physical Therapy)  Indicate any recent Medical Services you may have received from other than [facility] providers in the past year (date may be approximate).     Assessment:   This is a routine wellness examination for Mitchell Alvarado.  Hearing/Vision screen Hearing Screening - Comments:: Pt denies any hearing issues  Vision Screening - Comments:: Pt follows up with Dr Ellie Lunch for annual eye exams   Dietary issues and exercise activities discussed: Current Exercise Habits: Home exercise routine, Type of exercise: walking, Time (Minutes): 20, Frequency (Times/Week): 3, Weekly Exercise (Minutes/Week): 60   Goals Addressed             This Visit's Progress    Patient Stated       Lose 10 lbs        Depression Screen PHQ 2/9 Scores 01/14/2021 01/09/2020 06/24/2019 12/06/2018 12/06/2018 06/04/2018 05/29/2017  PHQ - 2 Score 0 0 0 0 0 0 0  PHQ- 9 Score - - 0 - - - -    Fall Risk Fall Risk  01/14/2021 01/09/2020 12/06/2018 12/06/2018 05/29/2017  Falls in the past year? 0 0 0 0 No  Number falls in past yr: 0 0 0 - -  Injury with Fall? 0 0 0 - -  Risk for fall due to : Impaired vision Impaired balance/gait;Impaired mobility;Impaired vision - - -  Follow up Falls prevention discussed Falls prevention discussed Education provided;Falls evaluation completed - -    FALL RISK PREVENTION PERTAINING TO THE HOME:  Any stairs in or around the home? Yes  If so, are there any without handrails? No  Home free of loose throw rugs in walkways, pet beds, electrical cords, etc? Yes  Adequate lighting in your  home to reduce risk of falls? Yes   ASSISTIVE DEVICES UTILIZED TO PREVENT FALLS:  Life alert? No  Use of a cane, walker or w/c? No  Grab bars in the bathroom? No  Shower chair or  bench in shower? No  Elevated toilet seat or a handicapped toilet? No   TIMED UP AND GO:  Was the test performed? No .   Cognitive Function:     6CIT Screen 01/14/2021 01/09/2020 12/06/2018  What Year? 0 points 0 points 0 points  What month? 0 points 0 points 0 points  What time? 0 points - 0 points  Count back from 20 0 points 0 points 0 points  Months in reverse 0 points 0 points 0 points  Repeat phrase 0 points 0 points 0 points  Total Score 0 - 0    Immunizations Immunization History  Administered Date(s) Administered   Fluad Quad(high Dose 65+) 12/06/2018   Influenza Whole 12/26/2008   Influenza,inj,Quad PF,6+ Mos 11/29/2016   Influenza-Unspecified 12/27/2012, 12/06/2013, 03/28/2014, 12/12/2017, 12/08/2020   PFIZER Comirnaty(Gray Top)Covid-19 Tri-Sucrose Vaccine 07/17/2020   PFIZER(Purple Top)SARS-COV-2 Vaccination 05/04/2019, 05/25/2019, 01/11/2020   Pfizer Covid-19 Vaccine Bivalent Booster 75yrs & up 12/22/2020   Pneumococcal Conjugate-13 05/26/2015   Pneumococcal Polysaccharide-23 05/15/2009, 11/28/2017   Td 01/27/1995   Tdap 01/15/2014   Zoster Recombinat (Shingrix) 04/22/2020, 06/23/2020   Zoster, Live 05/26/2015    TDAP status: Up to date  Flu Vaccine status: Up to date  Pneumococcal vaccine status: Up to date  Covid-19 vaccine status: Completed vaccines  Qualifies for Shingles Vaccine? Yes   Zostavax completed Yes   Shingrix Completed?: Yes  Screening Tests Health Maintenance  Topic Date Due   FOOT EXAM  06/23/2020   COLONOSCOPY (Pts 45-78yrs Insurance coverage will need to be confirmed)  07/26/2020   HEMOGLOBIN A1C  12/25/2020   OPHTHALMOLOGY EXAM  01/23/2021   URINE MICROALBUMIN  06/25/2021   TETANUS/TDAP  01/16/2024   Pneumonia Vaccine 14+ Years old  Completed    INFLUENZA VACCINE  Completed   COVID-19 Vaccine  Completed   Hepatitis C Screening  Completed   Zoster Vaccines- Shingrix  Completed   HPV VACCINES  Aged Out    Health Maintenance  Health Maintenance Due  Topic Date Due   FOOT EXAM  06/23/2020   COLONOSCOPY (Pts 45-48yrs Insurance coverage will need to be confirmed)  07/26/2020   HEMOGLOBIN A1C  12/25/2020    Colorectal cancer screening: Referral to GI placed Scheduled 01/29/21 . Pt aware the office will call re: appt.  Additional Screening:  Hepatitis C Screening:  Completed 05/27/16  Vision Screening: Recommended annual ophthalmology exams for early detection of glaucoma and other disorders of the eye. Is the patient up to date with their annual eye exam?  Yes  Who is the provider or what is the name of the office in which the patient attends annual eye exams? Dr [name] If pt is not established with a provider, would they like to be referred to a provider to establish care? No .   Dental Screening: Recommended annual dental exams for proper oral hygiene  Community Resource Referral / Chronic Care Management: CRR required this visit?  No   CCM required this visit?  No      Plan:     I have personally reviewed and noted the following in the patient's chart:   Medical and social history Use of alcohol, tobacco or illicit drugs  Current medications and supplements including opioid prescriptions. Patient is not currently taking opioid prescriptions. Functional ability and status Nutritional status Physical activity Advanced directives List of other physicians Hospitalizations, surgeries, and ER visits in previous 12 months Vitals Screenings to include cognitive, depression, and falls Referrals and appointments  In addition, I have reviewed and discussed with patient certain preventive protocols, quality metrics, and best practice recommendations. A written personalized care plan for preventive services as  well as general preventive health recommendations were provided to patient.     Willette Brace, LPN   17/53/[id number]   Nurse Notes: None

## 2021-01-14 NOTE — Patient Instructions (Signed)
Mitchell Alvarado , Thank you for taking time to come for your Medicare Wellness Visit. I appreciate your ongoing commitment to your health goals. Please review the following plan we discussed and let me know if I can assist you in the future.   Screening recommendations/referrals: Colonoscopy: Done 07/27/15 repeat every 5 years pt is scheduled Nov 06/2020 Recommended yearly ophthalmology/optometry visit for glaucoma screening and checkup Recommended yearly dental visit for hygiene and checkup  Vaccinations: Influenza vaccine: Done 12/08/20 repeat every year  Pneumococcal vaccine: Up to date Tdap vaccine: Done 01/15/14 repeat every 10 years  Shingles vaccine: Completed 1/26 & 06/23/20   Covid-19: Completed 2/6, 2/27, 01/11/20 & 07/17/20, 12/22/20  Advanced directives: Copies in chart   Conditions/risks identified: Lose 10 lbs   Next appointment: Follow up in one year for your annual wellness visit.   Preventive Care 17 Years and Older, Male Preventive care refers to lifestyle choices and visits with your health care provider that can promote health and wellness. What does preventive care include? A yearly physical exam. This is also called an annual well check. Dental exams once or twice a year. Routine eye exams. Ask your health care provider how often you should have your eyes checked. Personal lifestyle choices, including: Daily care of your teeth and gums. Regular physical activity. Eating a healthy diet. Avoiding tobacco and drug use. Limiting alcohol use. Practicing safe sex. Taking low doses of aspirin every day. Taking vitamin and mineral supplements as recommended by your health care provider. What happens during an annual well check? The services and screenings done by your health care provider during your annual well check will depend on your age, overall health, lifestyle risk factors, and family history of disease. Counseling  Your health care provider may ask you questions  about your: Alcohol use. Tobacco use. Drug use. Emotional well-being. Home and relationship well-being. Sexual activity. Eating habits. History of falls. Memory and ability to understand (cognition). Work and work Statistician. Screening  You may have the following tests or measurements: Height, weight, and BMI. Blood pressure. Lipid and cholesterol levels. These may be checked every 5 years, or more frequently if you are over 61 years old. Skin check. Lung cancer screening. You may have this screening every year starting at age 2 if you have a 30-pack-year history of smoking and currently smoke or have quit within the past 15 years. Fecal occult blood test (FOBT) of the stool. You may have this test every year starting at age 27. Flexible sigmoidoscopy or colonoscopy. You may have a sigmoidoscopy every 5 years or a colonoscopy every 10 years starting at age 3. Prostate cancer screening. Recommendations will vary depending on your family history and other risks. Hepatitis C blood test. Hepatitis B blood test. Sexually transmitted disease (STD) testing. Diabetes screening. This is done by checking your blood sugar (glucose) after you have not eaten for a while (fasting). You may have this done every 1-3 years. Abdominal aortic aneurysm (AAA) screening. You may need this if you are a current or former smoker. Osteoporosis. You may be screened starting at age 62 if you are at high risk. Talk with your health care provider about your test results, treatment options, and if necessary, the need for more tests. Vaccines  Your health care provider may recommend certain vaccines, such as: Influenza vaccine. This is recommended every year. Tetanus, diphtheria, and acellular pertussis (Tdap, Td) vaccine. You may need a Td booster every 10 years. Zoster vaccine. You may need this  after age 73. Pneumococcal 13-valent conjugate (PCV13) vaccine. One dose is recommended after age 38. Pneumococcal  polysaccharide (PPSV23) vaccine. One dose is recommended after age 41. Talk to your health care provider about which screenings and vaccines you need and how often you need them. This information is not intended to replace advice given to you by your health care provider. Make sure you discuss any questions you have with your health care provider. Document Released: 04/10/2015 Document Revised: 12/02/2015 Document Reviewed: 01/13/2015 Elsevier Interactive Patient Education  2017 [place] Prevention in the Home Falls can cause injuries. They can happen to people of all ages. There are many things you can do to make your home safe and to help prevent falls. What can I do on the outside of my home? Regularly fix the edges of walkways and driveways and fix any cracks. Remove anything that might make you trip as you walk through a door, such as a raised step or threshold. Trim any bushes or trees on the path to your home. Use bright outdoor lighting. Clear any walking paths of anything that might make someone trip, such as rocks or tools. Regularly check to see if handrails are loose or broken. Make sure that both sides of any steps have handrails. Any raised decks and porches should have guardrails on the edges. Have any leaves, snow, or ice cleared regularly. Use sand or salt on walking paths during winter. Clean up any spills in your garage right away. This includes oil or grease spills. What can I do in the bathroom? Use night lights. Install grab bars by the toilet and in the tub and shower. Do not use towel bars as grab bars. Use non-skid mats or decals in the tub or shower. If you need to sit down in the shower, use a plastic, non-slip stool. Keep the floor dry. Clean up any water that spills on the floor as soon as it happens. Remove soap buildup in the tub or shower regularly. Attach bath mats securely with double-sided non-slip rug tape. Do not have throw rugs and other  things on the floor that can make you trip. What can I do in the bedroom? Use night lights. Make sure that you have a light by your bed that is easy to reach. Do not use any sheets or blankets that are too big for your bed. They should not hang down onto the floor. Have a firm chair that has side arms. You can use this for support while you get dressed. Do not have throw rugs and other things on the floor that can make you trip. What can I do in the kitchen? Clean up any spills right away. Avoid walking on wet floors. Keep items that you use a lot in easy-to-reach places. If you need to reach something above you, use a strong step stool that has a grab bar. Keep electrical cords out of the way. Do not use floor polish or wax that makes floors slippery. If you must use wax, use non-skid floor wax. Do not have throw rugs and other things on the floor that can make you trip. What can I do with my stairs? Do not leave any items on the stairs. Make sure that there are handrails on both sides of the stairs and use them. Fix handrails that are broken or loose. Make sure that handrails are as long as the stairways. Check any carpeting to make sure that it is firmly attached to the  stairs. Fix any carpet that is loose or worn. Avoid having throw rugs at the top or bottom of the stairs. If you do have throw rugs, attach them to the floor with carpet tape. Make sure that you have a light switch at the top of the stairs and the bottom of the stairs. If you do not have them, ask someone to add them for you. What else can I do to help prevent falls? Wear shoes that: Do not have high heels. Have rubber bottoms. Are comfortable and fit you well. Are closed at the toe. Do not wear sandals. If you use a stepladder: Make sure that it is fully opened. Do not climb a closed stepladder. Make sure that both sides of the stepladder are locked into place. Ask someone to hold it for you, if possible. Clearly  mark and make sure that you can see: Any grab bars or handrails. First and last steps. Where the edge of each step is. Use tools that help you move around (mobility aids) if they are needed. These include: Canes. Walkers. Scooters. Crutches. Turn on the lights when you go into a dark area. Replace any light bulbs as soon as they burn out. Set up your furniture so you have a clear path. Avoid moving your furniture around. If any of your floors are uneven, fix them. If there are any pets around you, be aware of where they are. Review your medicines with your doctor. Some medicines can make you feel dizzy. This can increase your chance of falling. Ask your doctor what other things that you can do to help prevent falls. This information is not intended to replace advice given to you by your health care provider. Make sure you discuss any questions you have with your health care provider. Document Released: 01/08/2009 Document Revised: 08/20/2015 Document Reviewed: 04/18/2014 Elsevier Interactive Patient Education  2017 [employer].

## 2021-01-15 ENCOUNTER — Encounter: Payer: Self-pay | Admitting: Internal Medicine

## 2021-01-15 ENCOUNTER — Ambulatory Visit (AMBULATORY_SURGERY_CENTER): Payer: Medicare PPO | Admitting: *Deleted

## 2021-01-15 ENCOUNTER — Other Ambulatory Visit: Payer: Self-pay

## 2021-01-15 VITALS — Ht 74.0 in | Wt 270.0 lb

## 2021-01-15 DIAGNOSIS — Z86010 Personal history of colonic polyps: Secondary | ICD-10-CM

## 2021-01-15 NOTE — Progress Notes (Signed)

## 2021-01-26 ENCOUNTER — Other Ambulatory Visit: Payer: Self-pay | Admitting: Family Medicine

## 2021-01-27 DIAGNOSIS — H2513 Age-related nuclear cataract, bilateral: Secondary | ICD-10-CM | POA: Diagnosis not present

## 2021-01-27 DIAGNOSIS — H40053 Ocular hypertension, bilateral: Secondary | ICD-10-CM | POA: Diagnosis not present

## 2021-01-27 DIAGNOSIS — H40013 Open angle with borderline findings, low risk, bilateral: Secondary | ICD-10-CM | POA: Diagnosis not present

## 2021-01-27 DIAGNOSIS — H52203 Unspecified astigmatism, bilateral: Secondary | ICD-10-CM | POA: Diagnosis not present

## 2021-01-27 LAB — HM DIABETES EYE EXAM

## 2021-01-29 ENCOUNTER — Encounter: Payer: Self-pay | Admitting: Internal Medicine

## 2021-01-29 ENCOUNTER — Ambulatory Visit (AMBULATORY_SURGERY_CENTER): Payer: Medicare PPO | Admitting: Internal Medicine

## 2021-01-29 VITALS — BP 120/55 | HR 61 | Temp 96.9°F | Resp 21 | Ht 74.0 in | Wt 270.0 lb

## 2021-01-29 DIAGNOSIS — D124 Benign neoplasm of descending colon: Secondary | ICD-10-CM | POA: Diagnosis not present

## 2021-01-29 DIAGNOSIS — D125 Benign neoplasm of sigmoid colon: Secondary | ICD-10-CM

## 2021-01-29 DIAGNOSIS — Z86010 Personal history of colonic polyps: Secondary | ICD-10-CM | POA: Diagnosis not present

## 2021-01-29 DIAGNOSIS — D123 Benign neoplasm of transverse colon: Secondary | ICD-10-CM | POA: Diagnosis not present

## 2021-01-29 DIAGNOSIS — I10 Essential (primary) hypertension: Secondary | ICD-10-CM | POA: Diagnosis not present

## 2021-01-29 DIAGNOSIS — E785 Hyperlipidemia, unspecified: Secondary | ICD-10-CM | POA: Diagnosis not present

## 2021-01-29 DIAGNOSIS — D122 Benign neoplasm of ascending colon: Secondary | ICD-10-CM

## 2021-01-29 DIAGNOSIS — D120 Benign neoplasm of cecum: Secondary | ICD-10-CM | POA: Diagnosis not present

## 2021-01-29 MED ORDER — SODIUM CHLORIDE 0.9 % IV SOLN: 500.0000 mL | Freq: Once | INTRAVENOUS | Status: DC

## 2021-01-29 NOTE — Patient Instructions (Addendum)
I found and removed 8 small to medium polyps today. I will let you know pathology results and when to have another routine colonoscopy by mail and/or My Chart.  I appreciate the opportunity to care for you. Gatha Mayer, MD, FACG  YOU HAD AN ENDOSCOPIC PROCEDURE TODAY AT [facility]:   Refer to the procedure report that was given to you for any specific questions about what was found during the examination.  If the procedure report does not answer your questions, please call your gastroenterologist to clarify.  If you requested that your care partner not be given the details of your procedure findings, then the procedure report has been included in a sealed envelope for you to review at your convenience later.  **Handouts given on polyps and diverticulosis**  YOU SHOULD EXPECT: Some feelings of bloating in the abdomen. Passage of more gas than usual.  Walking can help get rid of the air that was put into your GI tract during the procedure and reduce the bloating. If you had a lower endoscopy (such as a colonoscopy or flexible sigmoidoscopy) you may notice spotting of blood in your stool or on the toilet paper. If you underwent a bowel prep for your procedure, you may not have a normal bowel movement for a few days.  Please Note:  You might notice some irritation and congestion in your nose or some drainage.  This is from the oxygen used during your procedure.  There is no need for concern and it should clear up in a day or so.  SYMPTOMS TO REPORT IMMEDIATELY:  Following lower endoscopy (colonoscopy or flexible sigmoidoscopy):  Excessive amounts of blood in the stool  Significant tenderness or worsening of abdominal pains  Swelling of the abdomen that is new, acute  Fever of 100F or higher   For urgent or emergent issues, a gastroenterologist can be reached at any hour by calling [phone]. Do not use MyChart messaging for urgent concerns.    DIET:  We do recommend  a small meal at first, but then you may proceed to your regular diet.  Drink plenty of fluids but you should avoid alcoholic beverages for 24 hours.  ACTIVITY:  You should plan to take it easy for the rest of today and you should NOT DRIVE or use heavy machinery until tomorrow (because of the sedation medicines used during the test).    FOLLOW UP: Our staff will call the number listed on your records 48-72 hours following your procedure to check on you and address any questions or concerns that you may have regarding the information given to you following your procedure. If we do not reach you, we will leave a message.  We will attempt to reach you two times.  During this call, we will ask if you have developed any symptoms of COVID 19. If you develop any symptoms (ie: fever, flu-like symptoms, shortness of breath, cough etc.) before then, please call [phone].  If you test positive for Covid 19 in the 2 weeks post procedure, please call and report this information to [place].    If any biopsies were taken you will be contacted by phone or by letter within the next 1-3 weeks.  Please call us at [phone] if you have not heard about the biopsies in 3 weeks.    SIGNATURES/CONFIDENTIALITY: You and/or your care partner have signed paperwork which will be entered into your electronic medical record.  These signatures attest to the fact  that that the information above on your After Visit Summary has been reviewed and is understood.  Full responsibility of the confidentiality of this discharge information lies with you and/or your care-partner.

## 2021-01-29 NOTE — Progress Notes (Signed)
Pt's states no medical or surgical changes since previsit or office visit.   Diabetic without meds.

## 2021-01-29 NOTE — Op Note (Signed)
[facility] Patient Name: Mitchell Alvarado Procedure Date: 01/29/2021 12:16 PM MRN: [id number] Endoscopist: Gatha Mayer , MD Age: 70 Referring MD:  Date of Birth: 1950/07/11 Gender: Male Account #: [id number] Procedure:                Colonoscopy Indications:              Surveillance: Personal history of adenomatous                            polyps on last colonoscopy 5 years ago, Last                            colonoscopy: 2017 Medicines:                Propofol per Anesthesia, Monitored Anesthesia Care Procedure:                Pre-Anesthesia Assessment:                           - Prior to the procedure, a History and Physical                            was performed, and patient medications and                            allergies were reviewed. The patient's tolerance of                            previous anesthesia was also reviewed. The risks                            and benefits of the procedure and the sedation                            options and risks were discussed with the patient.                            All questions were answered, and informed consent                            was obtained. Prior Anticoagulants: The patient has                            taken no previous anticoagulant or antiplatelet                            agents. ASA Grade Assessment: III - A patient with                            severe systemic disease. After reviewing the risks                            and benefits, the patient was deemed in  satisfactory condition to undergo the procedure.                           After obtaining informed consent, the colonoscope                            was passed under direct vision. Throughout the                            procedure, the patient's blood pressure, pulse, and                            oxygen saturations were monitored continuously. The                            Olympus CF-HQ190L  [id number]) Colonoscope was                            introduced through the anus and advanced to the the                            cecum, identified by appendiceal orifice and                            ileocecal valve. The colonoscopy was performed                            without difficulty. The patient tolerated the                            procedure well. The quality of the bowel                            preparation was good. The ileocecal valve,                            appendiceal orifice, and rectum were photographed. Scope In: 12:27:20 PM Scope Out: 12:45:33 PM Scope Withdrawal Time: 0 hours 14 minutes 33 seconds  Total Procedure Duration: 0 hours 18 minutes 13 seconds  Findings:                 The perianal and digital rectal examinations were                            normal.                           Eight sessile polyps were found in the sigmoid                            colon, descending colon, transverse colon,                            ascending colon and cecum. The polyps were 1 to 10  mm in size. These polyps were removed with a cold                            snare. Resection and retrieval were complete.                            Verification of patient identification for the                            specimen was done. Estimated blood loss was minimal.                           Multiple diverticula were found in the sigmoid                            colon and descending colon.                           The exam was otherwise without abnormality on                            direct and retroflexion views. Complications:            No immediate complications. Estimated Blood Loss:     Estimated blood loss was minimal. Impression:               - Eight 1 to 10 mm polyps in the sigmoid colon, in                            the descending colon, in the transverse colon, in                            the ascending colon and in the  cecum, removed with                            a cold snare. Resected and retrieved. 10 mm cecal                            polyp removed cold piecemeal and is in a bottle w/                            two 1 mm cecal polyps. Other polyps in bottle 2                           - Diverticulosis in the sigmoid colon and in the                            descending colon.                           - The examination was otherwise normal on direct  and retroflexion views.                           - Personal history of colonic polyps. 5 mm adenoma                            2006                           06/20/2012 1 cm cecal polyp - tubular adenoma                           07/27/2015 2 diminutive polyps - adenomas Recommendation:           - Patient has a contact number available for                            emergencies. The signs and symptoms of potential                            delayed complications were discussed with the                            patient. Return to normal activities tomorrow.                            Written discharge instructions were provided to the                            patient.                           - Resume previous diet.                           - Continue present medications.                           - Repeat colonoscopy is recommended for                            surveillance. The colonoscopy date will be                            determined after pathology results from today's                            exam become available for review. Gatha Mayer, MD 01/29/2021 12:53:15 PM This report has been signed electronically.

## 2021-01-29 NOTE — Progress Notes (Signed)
Sedate, gd SR, tolerated procedure well, VSS, report to RN 

## 2021-01-29 NOTE — Progress Notes (Signed)
[facility] History and Physical   Primary Care Physician:  [name], MD   Reason for Procedure:   Hx colon polyps  Plan:    colonoscopy     HPI: Mitchell Alvarado is a 70 y.o. male here for surveillance colonoscopy  5 mm adenoma 2006 06/20/2012 1 cm cecal polyp - tubular adenoma 07/27/2015 2 diminutive polyps - adenomas  Past Medical History:  Diagnosis Date   Allergy    ANEMIA, IRON DEFICIENCY 12/23/2009   Barrett's esophagus without dysplasia 05/2012   DIABETES MELLITUS, TYPE II 12/30/2006   ECZEMA 05/15/2009   GERD 12/30/2006   HYPERLIPIDEMIA 12/30/2006   HYPERTENSION 12/30/2006   HYPERTHYROIDISM 06/03/2008   Pernicious anemia 12/30/2006   Personal history of colonic polyps - adenoma 07/22/2004    Past Surgical History:  Procedure Laterality Date   COLONOSCOPY  multiple   COLONOSCOPY WITH PROPOFOL  07/27/2015   Dr.Yahmir Sokolov   ESOPHAGOGASTRODUODENOSCOPY  multiple   POLYPECTOMY     TRANSURETHRAL RESECTION OF PROSTATE  03/28/1998    Prior to Admission medications   Medication Sig Start Date End Date Taking? Authorizing Provider  aspirin 81 MG tablet Take 1 tablet (81 mg total) by mouth daily. 05/22/13  Yes Renato Shin, MD  calcitRIOL (ROCALTROL) 0.25 MCG capsule TAKE 1 CAPSULE (0.25 MCG TOTAL) BY MOUTH DAILY. TAKE MONDAY WEDNESDAY AND FRIDAY. 02/04/20  Yes [name], MD  carvedilol (COREG) 3.125 MG tablet TAKE 1 TABLET BY MOUTH TWICE A DAY 11/23/20  Yes [name], MD  Cholecalciferol (VITAMIN D) 2000 units CAPS Take 2,000 Units by mouth daily.   Yes [provider]  ezetimibe-simvastatin (VYTORIN) 10-80 MG tablet TAKE 1 TABLET BY MOUTH EVERY DAY 01/26/21  Yes [name], MD  fenofibrate (TRICOR) 145 MG tablet TAKE 1 TABLET BY MOUTH EVERY DAY 01/26/21  Yes [name], MD  glucose blood (ONETOUCH VERIO) test strip 1 each by Other route daily. And lancets 1/day 05/29/17  Yes Renato Shin, MD  hydrochlorothiazide (HYDRODIURIL)  25 MG tablet TAKE 1 TABLET BY MOUTH EVERY DAY 01/26/21  Yes [name], MD  latanoprost (XALATAN) 0.005 % ophthalmic solution Place 1 drop into both eyes at bedtime.   Yes [provider]  omeprazole (PRILOSEC) 20 MG capsule TAKE 1 CAPSULE BY MOUTH EVERY DAY 07/17/20  Yes [name], MD  vitamin B-12 (CYANOCOBALAMIN) 1000 MCG tablet Take 1,000 mcg by mouth daily.   Yes [provider]    Current Outpatient Medications  Medication Sig Dispense Refill   aspirin 81 MG tablet Take 1 tablet (81 mg total) by mouth daily. 90 tablet 3   calcitRIOL (ROCALTROL) 0.25 MCG capsule TAKE 1 CAPSULE (0.25 MCG TOTAL) BY MOUTH DAILY. TAKE MONDAY WEDNESDAY AND FRIDAY. 36 capsule 3   carvedilol (COREG) 3.125 MG tablet TAKE 1 TABLET BY MOUTH TWICE A DAY 180 tablet 0   Cholecalciferol (VITAMIN D) 2000 units CAPS Take 2,000 Units by mouth daily.     ezetimibe-simvastatin (VYTORIN) 10-80 MG tablet TAKE 1 TABLET BY MOUTH EVERY DAY 90 tablet 1   fenofibrate (TRICOR) 145 MG tablet TAKE 1 TABLET BY MOUTH EVERY DAY 90 tablet 0   glucose blood (ONETOUCH VERIO) test strip 1 each by Other route daily. And lancets 1/day 100 each 3   hydrochlorothiazide (HYDRODIURIL) 25 MG tablet TAKE 1 TABLET BY MOUTH EVERY DAY 90 tablet 0   latanoprost (XALATAN) 0.005 % ophthalmic solution Place 1 drop into both eyes at bedtime.  omeprazole (PRILOSEC) 20 MG capsule TAKE 1 CAPSULE BY MOUTH EVERY DAY 90 capsule 3   vitamin B-12 (CYANOCOBALAMIN) 1000 MCG tablet Take 1,000 mcg by mouth daily.     Current Facility-Administered Medications  Medication Dose Route Frequency Provider Last Rate Last Admin   0.9 %  sodium chloride infusion  500 mL Intravenous Once Gatha Mayer, MD        Allergies as of 01/29/2021   (No Known Allergies)    Family History  Problem Relation Age of Onset   Heart attack Father        MI   Depression Son    COPD Son    Cancer Neg Hx    Colon cancer Neg Hx    Esophageal cancer Neg  Hx    Stomach cancer Neg Hx     Social History   Socioeconomic History   Marital status: Legally Separated    Spouse name: Not on file   Number of children: Not on file   Years of education: Not on file   Highest education level: Not on file  Occupational History   Occupation: Retired from school system  Tobacco Use   Smoking status: Former   Smokeless tobacco: Never  Scientific laboratory technician Use: Never used  Substance and Sexual Activity   Alcohol use: Yes    Alcohol/week: 7.0 - 10.0 standard drinks    Types: 7 - 10 Glasses of wine per week    Comment: 7-10 glasses wine weekly   Drug use: No   Sexual activity: Not on file  Other Topics Concern   Not on file  Social History Narrative   Married   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 [employer]   7 drinks/week   Former smoker   No drug use   Social Determinants of Radio broadcast assistant Strain: Low Risk    Difficulty of Paying Living Expenses: Not hard at [employer] Insecurity: No Food Insecurity   Worried About Charity fundraiser in the Last Year: Never true   Arboriculturist in the Last Year: Never true  Transportation Needs: No Transportation Needs   Lack of Transportation (Medical): No   Lack of Transportation (Non-Medical): No  Physical Activity: Insufficiently Active   Days of Exercise per Week: 3 days   Minutes of Exercise per Session: 20 min  Stress: No Stress Concern Present   Feeling of Stress : Not at all  Social Connections: Socially Isolated   Frequency of Communication with Friends and Family: Three times a week   Frequency of Social Gatherings with Friends and Family: Twice a week   Attends Religious Services: Never   Marine scientist or Organizations: No   Attends Music therapist: Never   Marital Status: Separated  Intimate Partner Violence: Not At Risk   Fear of Current or Ex-Partner: No   Emotionally Abused: No   Physically  Abused: No   Sexually Abused: No    Review of Systems:  other review of systems negative except as mentioned in the HPI.  Physical Exam: Vital signs BP (!) 172/77 (BP Location: Right Arm, Patient Position: Sitting, Cuff Size: Normal)   Pulse 74   Temp (!) 96.9 F (36.1 C) (Temporal)   Ht 6\' 2"  (1.88 m)   Wt 270 lb (122.5 kg)   SpO2 100%   BMI 34.67 kg/m   General:   Alert,  Well-developed,  well-nourished, pleasant and cooperative in NAD Lungs:  Clear throughout to auscultation.   Heart:  Regular rate and rhythm; no murmurs, clicks, rubs,  or gallops. Abdomen:  Soft, nontender and nondistended. Normal bowel sounds.   Neuro/Psych:  Alert and cooperative. Normal mood and affect. A and O x 3   @Greysyn Vanderberg  Simonne Maffucci, MD, [facility] [phone] (pager) 01/29/2021 12:17 PM@

## 2021-01-29 NOTE — Progress Notes (Signed)
Called to room to assist during endoscopic procedure.  Patient ID and intended procedure confirmed with present staff. Received instructions for my participation in the procedure from the performing physician.  

## 2021-02-02 ENCOUNTER — Telehealth: Payer: Self-pay

## 2021-02-02 NOTE — Telephone Encounter (Signed)
  Follow up Call-  Call back number 01/29/2021  Post procedure Call Back phone  # [phone]  Permission to leave phone message Yes  Some recent data might be hidden     Left message

## 2021-02-02 NOTE — Telephone Encounter (Signed)
  Follow up Call-  Call back number 01/29/2021  Post procedure Call Back phone  # [phone]  Permission to leave phone message Yes  Some recent data might be hidden     Patient questions:  Do you have a fever, pain , or abdominal swelling? No. Pain Score  0 *  Have you tolerated food without any problems? Yes.    Have you been able to return to your normal activities? Yes.    Do you have any questions about your discharge instructions: Diet   No. Medications  No. Follow up visit  No.  Do you have questions or concerns about your Care? No.  Actions: * If pain score is 4 or above: No action needed, pain <4.

## 2021-02-03 ENCOUNTER — Encounter: Payer: Self-pay | Admitting: Internal Medicine

## 2021-02-03 DIAGNOSIS — Z86010 Personal history of colonic polyps: Secondary | ICD-10-CM

## 2021-02-10 ENCOUNTER — Encounter: Payer: Self-pay | Admitting: Family Medicine

## 2021-02-16 ENCOUNTER — Other Ambulatory Visit: Payer: Self-pay | Admitting: Family Medicine

## 2021-04-02 ENCOUNTER — Other Ambulatory Visit: Payer: Self-pay

## 2021-04-02 DIAGNOSIS — D539 Nutritional anemia, unspecified: Secondary | ICD-10-CM

## 2021-04-05 ENCOUNTER — Inpatient Hospital Stay: Payer: Medicare PPO

## 2021-04-05 ENCOUNTER — Other Ambulatory Visit: Payer: Self-pay

## 2021-04-05 ENCOUNTER — Inpatient Hospital Stay: Payer: Medicare PPO | Attending: Hematology | Admitting: Hematology

## 2021-04-05 VITALS — BP 148/64 | HR 81 | Temp 97.5°F | Resp 20 | Wt 271.0 lb

## 2021-04-05 DIAGNOSIS — D510 Vitamin B12 deficiency anemia due to intrinsic factor deficiency: Secondary | ICD-10-CM | POA: Diagnosis not present

## 2021-04-05 DIAGNOSIS — R7989 Other specified abnormal findings of blood chemistry: Secondary | ICD-10-CM | POA: Diagnosis not present

## 2021-04-05 DIAGNOSIS — E538 Deficiency of other specified B group vitamins: Secondary | ICD-10-CM | POA: Diagnosis not present

## 2021-04-05 DIAGNOSIS — D539 Nutritional anemia, unspecified: Secondary | ICD-10-CM

## 2021-04-05 LAB — CBC WITH DIFFERENTIAL (CANCER CENTER ONLY)
Abs Immature Granulocytes: 0.01 10*3/uL (ref 0.00–0.07)
Basophils Absolute: 0 10*3/uL (ref 0.0–0.1)
Basophils Relative: 1 %
Eosinophils Absolute: 0.1 10*3/uL (ref 0.0–0.5)
Eosinophils Relative: 4 %
HCT: 26.5 % — ABNORMAL LOW (ref 39.0–52.0)
Hemoglobin: 9.1 g/dL — ABNORMAL LOW (ref 13.0–17.0)
Immature Granulocytes: 0 %
Lymphocytes Relative: 44 %
Lymphs Abs: 1.5 10*3/uL (ref 0.7–4.0)
MCH: 35.3 pg — ABNORMAL HIGH (ref 26.0–34.0)
MCHC: 34.3 g/dL (ref 30.0–36.0)
MCV: 102.7 fL — ABNORMAL HIGH (ref 80.0–100.0)
Monocytes Absolute: 0.3 10*3/uL (ref 0.1–1.0)
Monocytes Relative: 9 %
Neutro Abs: 1.5 10*3/uL — ABNORMAL LOW (ref 1.7–7.7)
Neutrophils Relative %: 42 %
Platelet Count: 197 10*3/uL (ref 150–400)
RBC: 2.58 MIL/uL — ABNORMAL LOW (ref 4.22–5.81)
RDW: 14.3 % (ref 11.5–15.5)
WBC Count: 3.5 10*3/uL — ABNORMAL LOW (ref 4.0–10.5)
nRBC: 0 % (ref 0.0–0.2)

## 2021-04-05 LAB — CMP (CANCER CENTER ONLY)
ALT: 16 U/L (ref 0–44)
AST: 24 U/L (ref 15–41)
Albumin: 3.9 g/dL (ref 3.5–5.0)
Alkaline Phosphatase: 22 U/L — ABNORMAL LOW (ref 38–126)
Anion gap: 10 (ref 5–15)
BUN: 28 mg/dL — ABNORMAL HIGH (ref 8–23)
CO2: 23 mmol/L (ref 22–32)
Calcium: 9.2 mg/dL (ref 8.9–10.3)
Chloride: 106 mmol/L (ref 98–111)
Creatinine: 1.98 mg/dL — ABNORMAL HIGH (ref 0.61–1.24)
GFR, Estimated: 36 mL/min — ABNORMAL LOW (ref >60)
Glucose, Bld: 121 mg/dL — ABNORMAL HIGH (ref 70–99)
Potassium: 4.2 mmol/L (ref 3.5–5.1)
Sodium: 139 mmol/L (ref 135–145)
Total Bilirubin: 0.5 mg/dL (ref 0.3–1.2)
Total Protein: 6.6 g/dL (ref 6.5–8.1)

## 2021-04-05 LAB — RETIC PANEL
Immature Retic Fract: 17.1 % — ABNORMAL HIGH (ref 2.3–15.9)
RBC.: 2.61 MIL/uL — ABNORMAL LOW (ref 4.22–5.81)
Retic Count, Absolute: 34.5 10*3/uL (ref 19.0–186.0)
Retic Ct Pct: 1.3 % (ref 0.4–3.1)
Reticulocyte Hemoglobin: 38.5 pg (ref >27.9)

## 2021-04-05 LAB — VITAMIN B12: Vitamin B-12: 421 pg/mL (ref 180–914)

## 2021-04-05 LAB — LACTATE DEHYDROGENASE: LDH: 143 U/L (ref 98–192)

## 2021-04-05 LAB — FERRITIN: Ferritin: 1321 ng/mL — ABNORMAL HIGH (ref 24–336)

## 2021-04-06 ENCOUNTER — Telehealth: Payer: Self-pay | Admitting: Hematology

## 2021-04-06 NOTE — Telephone Encounter (Signed)
Left message with follow-up appointment per 1/9 los.

## 2021-04-11 NOTE — Progress Notes (Addendum)
HEMATOLOGY/ONCOLOGY CLINIC NOTE  Date of Service: 04/12/2021  Patient Care Team: [name], MD as PCP - General (Family Medicine) [name], MD as Attending Physician (Ophthalmology) [name], MD (Inactive) as Attending Physician (Urology) [name], MD as Consulting Physician (Nephrology) Lyndee Hensen, PT as Physical Therapist (Physical Therapy)  CHIEF COMPLAINTS/PURPOSE OF CONSULTATION:  Follow-up for multifactorial anemia  HISTORY OF PRESENTING ILLNESS:   Mitchell Alvarado is a wonderful 71 y.o. male who has been referred to [place] by Dr Renato Shin for evaluation and management of anemia. The pt reports that he is doing well overall.   The pt reports that he has been taking monthly Vitamin B12 injections for the last 4-5 years, and his deficiency was first realized through routine blood work. He was taken off Metformin at the time. He has taken Omeprazole for 20 years for his Barrett's esophagus. He denies any food intolerances or thyroid problems. He notes that he has had some fatigue which he attributes to getting older.   He has seen a nephrologist several years ago and was discharged after replacing his Vitamin D satisfactorily and he denies any liver problems as well.   Most recent lab results (05/29/17) of CBC  is as follows: all values are WNL except for WBC at 3.5k, RBC at 3.04, HGB at 11.3, HCT at 32.9, MCV at 108.0.  Review of CBC w/diff over the last two years reveals stability in the above abnormalities.   On review of systems, pt reports some fatigue, stable weight, and denies problems of infection, bleeding, back pains, pain along the spine, abdominal pains, changes in bowel habits, leg selling, urinary discomfort, and any other symptoms.   On PMHx the pt reports neuropathy, prostate surgery in 2000, denies Afib. On Social Hx the pt reports 10-14 glasses of wine each week  and denies any concern for excessive consumption. He denies smoking and  smoked cigarettes for one year a long time ago  On Family Hx the pt reports paternal heart attack.  Interval History:   Mitchell Alvarado returns today for his 95-monthfollow-up for continued evaluation and management of his macrocytic anemia. He notes some mild fatigue but no acute change in clinical status since his last clinic visit 6 months ago. We discussed his labs in detail. His hemoglobin levels have been downtrending and are today down to 9.1 (down from 10.5, 9 months ago), WBC count 3.5k with ANC of 1.5k and normal platelets of 197k.  He notes no evidence of overt bleeding. Continues to take his B12 replacement and his B12 levels today are 421.  Also on vitamin B complex. Ferritin 1321 Reticulocyte panel show absolute reticulocyte count of 34.5 which is suppressed LDH within normal limits at 143  MEDICAL HISTORY:  Past Medical History:  Diagnosis Date   Allergy    ANEMIA, IRON DEFICIENCY 12/23/2009   Barrett's esophagus without dysplasia 05/2012   DIABETES MELLITUS, TYPE II 12/30/2006   ECZEMA 05/15/2009   GERD 12/30/2006   HYPERLIPIDEMIA 12/30/2006   HYPERTENSION 12/30/2006   HYPERTHYROIDISM 06/03/2008   Pernicious anemia 12/30/2006   Personal history of colonic polyps - adenoma 07/22/2004    SURGICAL HISTORY: Past Surgical History:  Procedure Laterality Date   COLONOSCOPY  multiple   COLONOSCOPY WITH PROPOFOL  07/27/2015   Dr.Gessner   ESOPHAGOGASTRODUODENOSCOPY  multiple   POLYPECTOMY     TRANSURETHRAL RESECTION OF PROSTATE  03/28/1998    SOCIAL HISTORY: Social History   Socioeconomic History  Marital status: Legally Separated    Spouse name: Not on file   Number of children: Not on file   Years of education: Not on file   Highest education level: Not on file  Occupational History   Occupation: Retired from school system  Tobacco Use   Smoking status: Former   Smokeless tobacco: Never  Vaping Use   Vaping Use: Never used  Substance and  Sexual Activity   Alcohol use: Yes    Alcohol/week: 7.0 - 10.0 standard drinks    Types: 7 - 10 Glasses of wine per week    Comment: 7-10 glasses wine weekly   Drug use: No   Sexual activity: Not on file  Other Topics Concern   Not on file  Social History Narrative   Married   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 [employer]   7 drinks/week   Former smoker   No drug use   Social Determinants of Radio broadcast assistant Strain: Low Risk    Difficulty of Paying Living Expenses: Not hard at [employer] Insecurity: No Food Insecurity   Worried About Charity fundraiser in the Last Year: Never true   Arboriculturist in the Last Year: Never true  Transportation Needs: No Transportation Needs   Lack of Transportation (Medical): No   Lack of Transportation (Non-Medical): No  Physical Activity: Insufficiently Active   Days of Exercise per Week: 3 days   Minutes of Exercise per Session: 20 min  Stress: No Stress Concern Present   Feeling of Stress : Not at all  Social Connections: Socially Isolated   Frequency of Communication with Friends and Family: Three times a week   Frequency of Social Gatherings with Friends and Family: Twice a week   Attends Religious Services: Never   Marine scientist or Organizations: No   Attends Music therapist: Never   Marital Status: Separated  Human resources Mitchell Violence: Not At Risk   Fear of Current or Ex-Partner: No   Emotionally Abused: No   Physically Abused: No   Sexually Abused: No    FAMILY HISTORY: Family History  Problem Relation Age of Onset   Heart attack Father        MI   Depression Son    COPD Son    Cancer Neg Hx    Colon cancer Neg Hx    Esophageal cancer Neg Hx    Stomach cancer Neg Hx     ALLERGIES:  has No Known Allergies.  MEDICATIONS:  Current Outpatient Medications  Medication Sig Dispense Refill   aspirin 81 MG tablet Take 1 tablet (81 mg total) by  mouth daily. 90 tablet 3   calcitRIOL (ROCALTROL) 0.25 MCG capsule TAKE 1 CAPSULE (0.25 MCG TOTAL) BY MOUTH DAILY. TAKE MONDAY WEDNESDAY AND FRIDAY. 36 capsule 3   carvedilol (COREG) 3.125 MG tablet TAKE 1 TABLET BY MOUTH TWICE A DAY 180 tablet 0   Cholecalciferol (VITAMIN D) 2000 units CAPS Take 2,000 Units by mouth daily.     ezetimibe-simvastatin (VYTORIN) 10-80 MG tablet TAKE 1 TABLET BY MOUTH EVERY DAY 90 tablet 1   fenofibrate (TRICOR) 145 MG tablet TAKE 1 TABLET BY MOUTH EVERY DAY 90 tablet 0   glucose blood (ONETOUCH VERIO) test strip 1 each by Other route daily. And lancets 1/day 100 each 3   hydrochlorothiazide (HYDRODIURIL) 25 MG tablet TAKE 1 TABLET BY MOUTH EVERY DAY 90 tablet 0  latanoprost (XALATAN) 0.005 % ophthalmic solution Place 1 drop into both eyes at bedtime.     omeprazole (PRILOSEC) 20 MG capsule TAKE 1 CAPSULE BY MOUTH EVERY DAY 90 capsule 3   vitamin B-12 (CYANOCOBALAMIN) 1000 MCG tablet Take 1,000 mcg by mouth daily.     No current facility-administered medications for this visit.    REVIEW OF SYSTEMS:   .10 Point review of Systems was done is negative except as noted above.  PHYSICAL EXAMINATION:  . Vitals:   04/05/21 1403  BP: (!) 148/64  Pulse: 81  Resp: 20  Temp: (!) 97.5 F (36.4 C)  SpO2: 100%    Filed Weights   04/05/21 1403  Weight: 271 lb (122.9 kg)    .Body mass index is 34.79 kg/m.  NAD . GENERAL:alert, in no acute distress and comfortable SKIN: no acute rashes, no significant lesions EYES: conjunctiva are pink and non-injected, sclera anicteric OROPHARYNX: MMM, no exudates, no oropharyngeal erythema or ulceration NECK: supple, no JVD LYMPH:  no palpable lymphadenopathy in the cervical, axillary or inguinal regions LUNGS: clear to auscultation b/l with normal respiratory effort HEART: regular rate & rhythm ABDOMEN:  normoactive bowel sounds , non tender, not distended. Extremity: no pedal edema PSYCH: alert & oriented x 3 with  fluent speech NEURO: no focal motor/sensory deficits   LABORATORY DATA:  I have reviewed the data as listed . CBC Latest Ref Rng & Units 04/05/2021 10/02/2020 06/25/2020  WBC 4.0 - 10.5 K/uL 3.5(L) 4.4 5.0  Hemoglobin 13.0 - 17.0 g/dL 9.1(L) 9.4(L) 10.5(L)  Hematocrit 39.0 - 52.0 % 26.5(L) 27.6(L) 31.1(L)  Platelets 150 - 400 K/uL 197 220 231.0   .CBC    Component Value Date/Time   WBC 3.5 (L) 04/05/2021 1341   WBC 4.4 10/02/2020 1159   RBC 2.61 (L) 04/05/2021 1341   RBC 2.58 (L) 04/05/2021 1341   HGB 9.1 (L) 04/05/2021 1341   HCT 26.5 (L) 04/05/2021 1341   HCT 31.1 (L) 09/07/2017 1405   PLT 197 04/05/2021 1341   MCV 102.7 (H) 04/05/2021 1341   MCH 35.3 (H) 04/05/2021 1341   MCHC 34.3 04/05/2021 1341   RDW 14.3 04/05/2021 1341   LYMPHSABS 1.5 04/05/2021 1341   MONOABS 0.3 04/05/2021 1341   EOSABS 0.1 04/05/2021 1341   BASOSABS 0.0 04/05/2021 1341    . CMP Latest Ref Rng & Units 04/05/2021 10/02/2020 06/25/2020  Glucose 70 - 99 mg/dL 121(H) 132(H) -  BUN 8 - 23 mg/dL 28(H) 24(H) -  Creatinine 0.61 - 1.24 mg/dL 1.98(H) 1.85(H) -  Sodium 135 - 145 mmol/L 139 139 -  Potassium 3.5 - 5.1 mmol/L 4.2 4.6 -  Chloride 98 - 111 mmol/L 106 104 -  CO2 22 - 32 mmol/L 23 26 -  Calcium 8.9 - 10.3 mg/dL 9.2 9.8 10.2  Total Protein 6.5 - 8.1 g/dL 6.6 6.7 -  Total Bilirubin 0.3 - 1.2 mg/dL 0.5 0.4 -  Alkaline Phos 38 - 126 U/L 22(L) 26(L) -  AST 15 - 41 U/L 24 21 -  ALT 0 - 44 U/L 16 17 -   . Lab Results  Component Value Date   IRON 139 03/05/2020   TIBC 260 03/05/2020   IRONPCTSAT 54 03/05/2020   (Iron and TIBC)  Lab Results  Component Value Date   FERRITIN 1,321 (H) 04/05/2021     Component     Latest Ref Rng & Units 09/07/2017  IgG (Immunoglobin G), Serum     700 - 1,600 mg/dL  823  IgA     61 - 437 mg/dL 248  IgM (Immunoglobulin M), Srm     20 - 172 mg/dL 143  Total Protein ELP     6.0 - 8.5 g/dL 6.7  Albumin SerPl Elph-Mcnc     2.9 - 4.4 g/dL 4.0  Alpha 1     0.0 -  0.4 g/dL 0.2  Alpha2 Glob SerPl Elph-Mcnc     0.4 - 1.0 g/dL 0.8  B-Globulin SerPl Elph-Mcnc     0.7 - 1.3 g/dL 1.1  Gamma Glob SerPl Elph-Mcnc     0.4 - 1.8 g/dL 0.6  M Protein SerPl Elph-Mcnc     Not Observed g/dL Not Observed  Globulin, Total     2.2 - 3.9 g/dL 2.7  Albumin/Glob SerPl     0.7 - 1.7 1.5  IFE 1      Comment  Please Note (HCV):      Comment  Folate, Hemolysate     Not Estab. ng/mL 204.0  HCT     37.5 - 51.0 % 31.1 (L)  Folate, RBC     >498 ng/mL 656  Vitamin B12     180 - 914 pg/mL 6,847 (H)  TSH     0.320 - 4.118 uIU/mL 1.016  Ferritin     22 - 316 ng/mL 867 (H)  Copper     72 - 166 ug/dL 97  Parietal Cell Antibody-IgG     0.0 - 20.0 Units 10.9  Intrinsic Factor     0.0 - 1.1 AU/mL 11.8 (H)    RADIOGRAPHIC STUDIES: I have personally reviewed the radiological images as listed and agreed with the findings in the report. No results found.  ASSESSMENT & PLAN:   71 y.o. male with  1. Macrocytic Anemia - multifactorial -- pernicious anemia with anti IF antibodies + Anemia from CKD + cannot r/o an element of low grade MDS ? ? ETOH use  , Low testosterone levels Review of patient's CBCs w/diff over the last two years reveals stability on the above abnormalities  Patient appears to have pernicious anemia with anti intrinsic factor antibodies. Currently no B12 or folate deficiency deficiency.but could have other B vit deficiencies. No monoclonal paraproteinemia. Concern for ETOH use Has some CKD that could be contributing to the anemia as well. No known liver disease No overt bleeding noted.  2. B12 deficiency -multifactorial -- pernicious anemia + previous metformin use + current PPI use.  PLAN: -I discussed his lab results from today in detail His hemoglobin levels have been downtrending and are today down to 9.1 (down from 10.5, 9 months ago), WBC count 3.5k with ANC of 1.5k and normal platelets of 197k. He notes no evidence of overt  bleeding. Continues to take his B12 replacement and his B12 levels today are 421.  Also on vitamin B complex. Ferritin 1321 Reticulocyte panel show absolute reticulocyte count of 34.5 which is suppressed-likely from CKD. LDH within normal limits at 143-suggests against hemolysis.  -At this time the patient does not desire consideration of testosterone replacement and declined offer for referral to endocrinology. -We discussed option for bone marrow aspiration and biopsy to rule out MDS.  Patient would like to hold off on bone marrow biopsy and manage this conservatively. -We discussed that if his hemoglobin drops below 8.5 we would need to consider starting EPO for his anemia from chronic kidney disease. -Continue sublingual B12 2000 mcg daily -Continue oral vitamin B complex 1 capsule p.o. daily over-the-counter. -  Would hold iron replacement given elevated ferritin levels. -Minimize alcohol intake. -Patient will call us if he starts having worsening fatigue or dyspnea on exertion lightheadedness. -We will see him back sooner in 4 months with repeat labs.  3.  Elevated ferritin levels Patient previously had Barrett's esophagus and iron deficiency anemia.  Currently his ferritin is remained elevated likely in the context of significant previous iron replacement. -Hold off on any iron supplementation. -We will get HFE gene mutation studies with next labs to rule out any concerns with hereditary hemochromatosis.  4 Patient Active Problem List   Diagnosis Date Noted   Vitamin B12 deficiency 06/25/2020   Hyperparathyroidism, secondary ([place]) 05/27/2016   CKD stage 3 secondary to diabetes ([place]) 05/21/2012   Iron deficiency anemia 12/23/2009   ECZEMA 05/15/2009   Diabetes ([place]) 12/30/2006   Dyslipidemia and hypertriglyceridemia associated with type 2 diabetes mellitus ([place]) 12/30/2006   Hypertension associated with diabetes ([place]) 12/30/2006   GERD 12/30/2006   Personal history of colonic  polyps - adenoma 07/22/2004   Barrett's esophagus -less than 1 cm, does not meet current criteria for this diagnosis 01/24/2002  -Mx per PCP   FOLLOW UP: Return to clinic with Dr. Irene Limbo with labs in 4 months   All of the patient's questions were answered with apparent satisfaction. The patient knows to call the clinic with any problems, questions or concerns.   Sullivan Lone MD [place] AAHIVMS [facility] Hematology/Oncology Physician [facility]

## 2021-04-12 NOTE — Addendum Note (Signed)
Addended by: Sullivan Lone on: 04/12/2021 12:46 PM   Modules accepted: Orders

## 2021-04-27 ENCOUNTER — Other Ambulatory Visit: Payer: Self-pay | Admitting: Family Medicine

## 2021-05-06 IMAGING — DX DG SHOULDER 2+V*L*
3 series · 3 of 3 positions shown · non-contrast
Comparison: None.

CLINICAL DATA: Persistent left shoulder pain since fall 6 weeks
ago.

EXAM:
LEFT SHOULDER - 2+ VIEW

[shoulder (grashey view) ap]
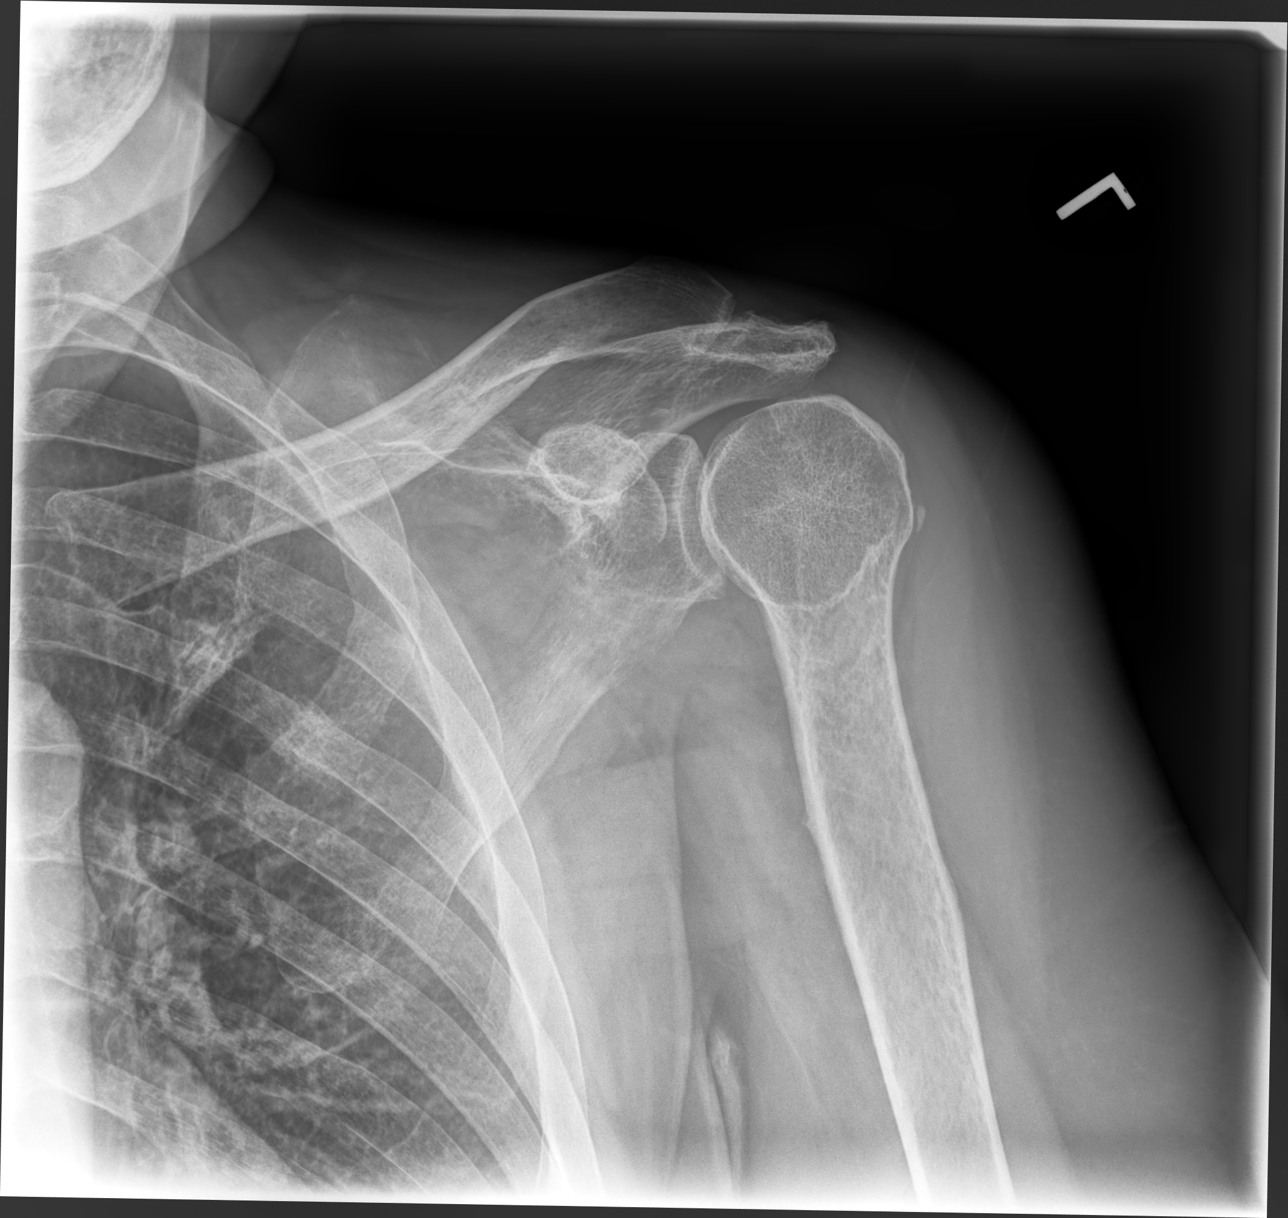

[shoulder (y view) lat]
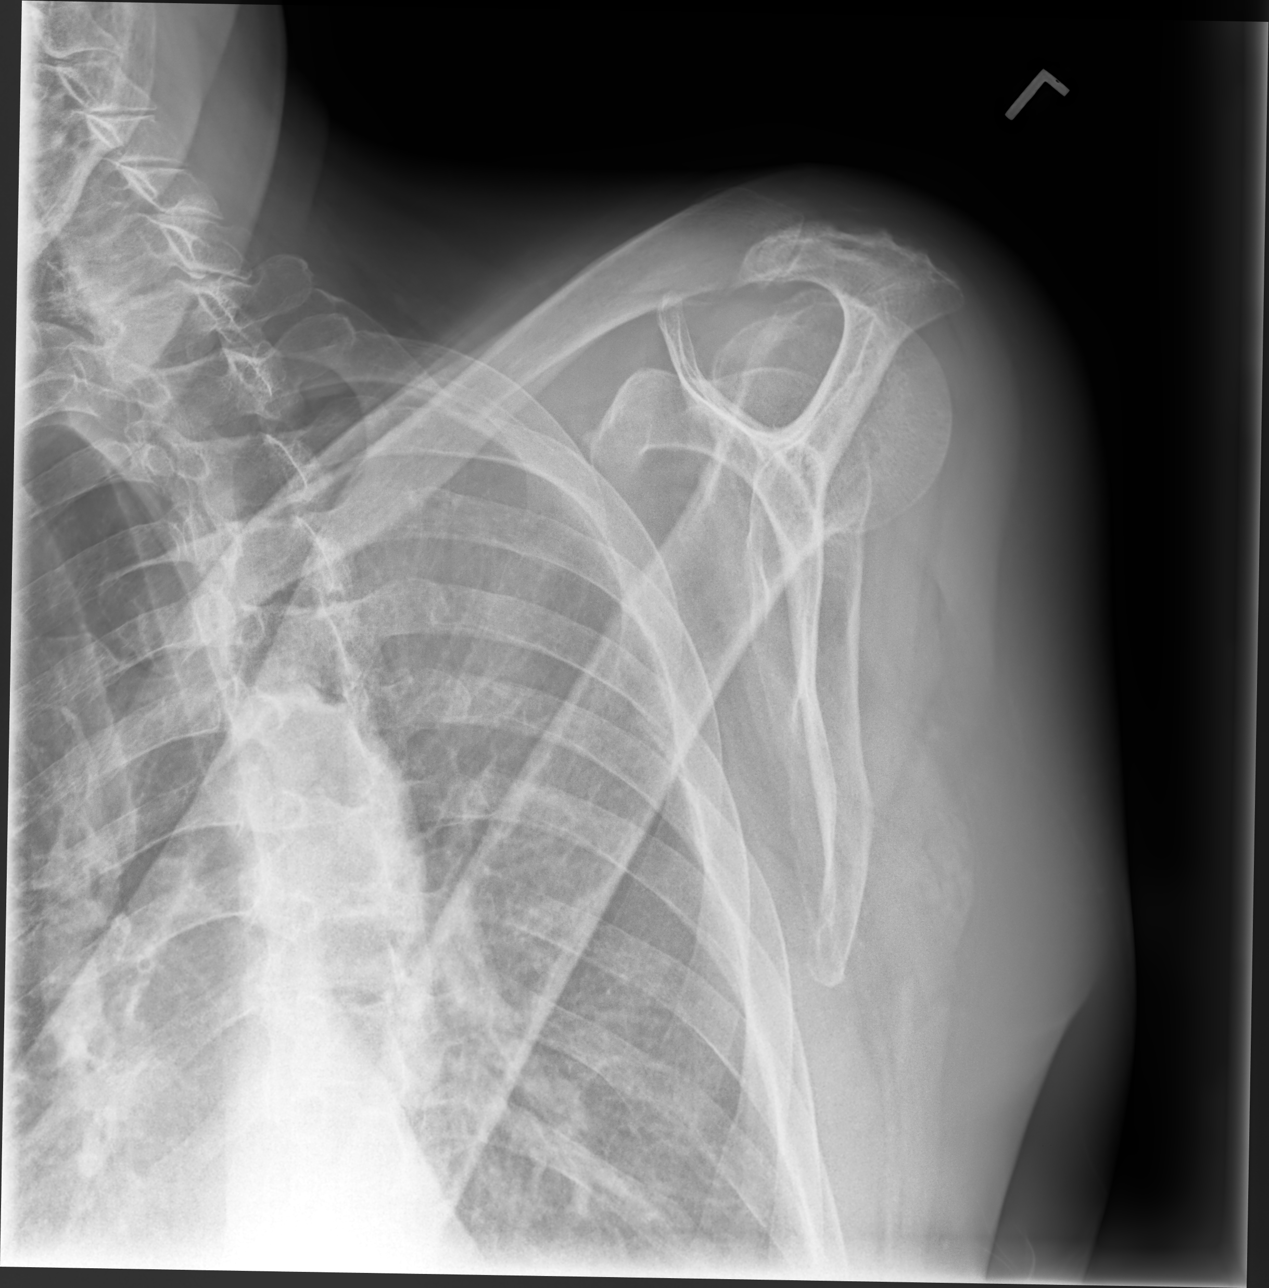

[shoulder axial 45°]
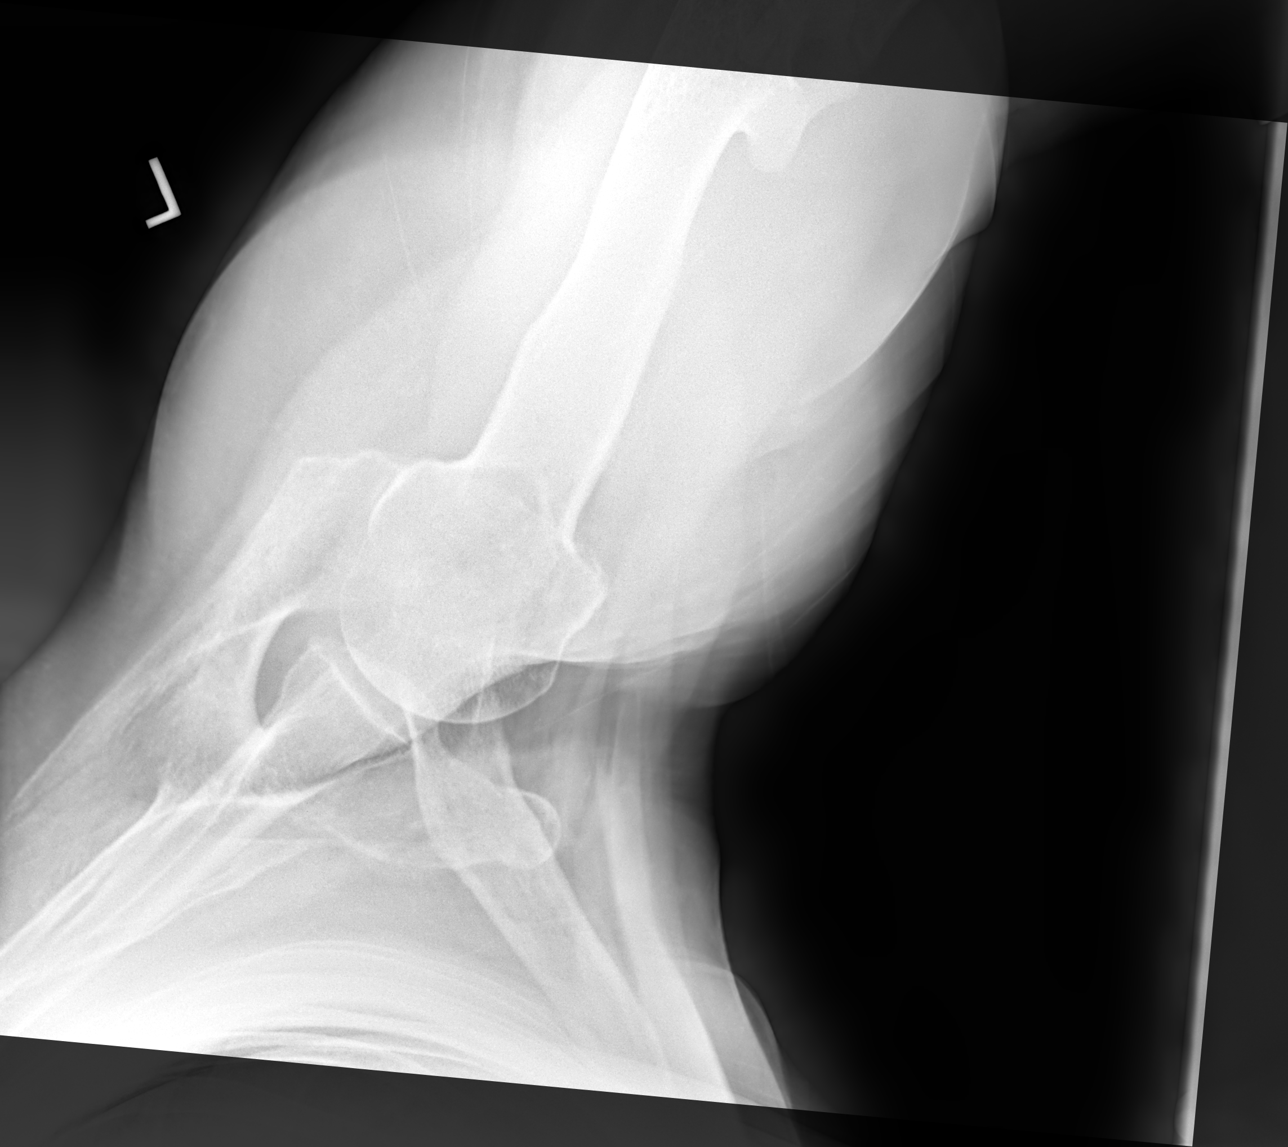

[3 of 3 positions shown; findings below may reference images not displayed]

FINDINGS: Age-indeterminate though presumably chronic fracture involving the
posterior aspect of the left fifth rib.

Tiny ossicle adjacent to the posterolateral aspect of the humeral
head, likely the sequela of previous avulsive injury, without
discrete donor site identified.

Otherwise, no acute fracture or dislocation. Mild degenerative
change of the left glenohumeral and acromioclavicular joints with
joint space loss, subchondral sclerosis and osteophytosis.
Additionally, there is a faint calcification at the expected
location of the rotator cuff insertion site as could be seen in the
setting of calcific tendinitis.

Limited visualization adjacent thorax is otherwise normal. Regional
soft tissues appear normal.
IMPRESSION: 1. Age indeterminate fracture involving the posterolateral aspect
the left fifth rib. Correlation for point tenderness at this
location is advised.
2. Faint calcifications at the expected location of the rotator cuff
insertion site as could be seen in the setting of calcific
tendinitis.
3. Mild degenerative change of the left glenohumeral and
acromioclavicular joints.

## 2021-05-18 ENCOUNTER — Other Ambulatory Visit: Payer: Self-pay | Admitting: Family Medicine

## 2021-06-28 ENCOUNTER — Encounter: Payer: Self-pay | Admitting: Family Medicine

## 2021-06-28 ENCOUNTER — Ambulatory Visit (INDEPENDENT_AMBULATORY_CARE_PROVIDER_SITE_OTHER): Payer: Medicare PPO | Admitting: Family Medicine

## 2021-06-28 VITALS — BP 134/72 | HR 73 | Temp 97.9°F | Ht 74.0 in | Wt 265.6 lb

## 2021-06-28 DIAGNOSIS — K219 Gastro-esophageal reflux disease without esophagitis: Secondary | ICD-10-CM | POA: Diagnosis not present

## 2021-06-28 DIAGNOSIS — D509 Iron deficiency anemia, unspecified: Secondary | ICD-10-CM | POA: Diagnosis not present

## 2021-06-28 DIAGNOSIS — N2581 Secondary hyperparathyroidism of renal origin: Secondary | ICD-10-CM | POA: Diagnosis not present

## 2021-06-28 DIAGNOSIS — Z0001 Encounter for general adult medical examination with abnormal findings: Secondary | ICD-10-CM | POA: Diagnosis not present

## 2021-06-28 DIAGNOSIS — E1159 Type 2 diabetes mellitus with other circulatory complications: Secondary | ICD-10-CM

## 2021-06-28 DIAGNOSIS — E538 Deficiency of other specified B group vitamins: Secondary | ICD-10-CM

## 2021-06-28 DIAGNOSIS — N183 Chronic kidney disease, stage 3 unspecified: Secondary | ICD-10-CM

## 2021-06-28 DIAGNOSIS — E785 Hyperlipidemia, unspecified: Secondary | ICD-10-CM

## 2021-06-28 DIAGNOSIS — E1122 Type 2 diabetes mellitus with diabetic chronic kidney disease: Secondary | ICD-10-CM

## 2021-06-28 DIAGNOSIS — I152 Hypertension secondary to endocrine disorders: Secondary | ICD-10-CM | POA: Diagnosis not present

## 2021-06-28 DIAGNOSIS — E1169 Type 2 diabetes mellitus with other specified complication: Secondary | ICD-10-CM | POA: Diagnosis not present

## 2021-06-28 LAB — LIPID PANEL
Cholesterol: 181 mg/dL (ref 0–200)
HDL: 97.2 mg/dL (ref >39.00)
LDL Cholesterol: 69 mg/dL (ref 0–99)
NonHDL: 83.36
Total CHOL/HDL Ratio: 2
Triglycerides: 70 mg/dL (ref 0.0–149.0)
VLDL: 14 mg/dL (ref 0.0–40.0)

## 2021-06-28 LAB — VITAMIN B12: Vitamin B-12: 708 pg/mL (ref 211–911)

## 2021-06-28 LAB — COMPREHENSIVE METABOLIC PANEL
ALT: 13 U/L (ref 0–53)
AST: 19 U/L (ref 0–37)
Albumin: 4.1 g/dL (ref 3.5–5.2)
Alkaline Phosphatase: 22 U/L — ABNORMAL LOW (ref 39–117)
BUN: 21 mg/dL (ref 6–23)
CO2: 23 mEq/L (ref 19–32)
Calcium: 9.9 mg/dL (ref 8.4–10.5)
Chloride: 103 mEq/L (ref 96–112)
Creatinine, Ser: 1.82 mg/dL — ABNORMAL HIGH (ref 0.40–1.50)
GFR: 37.01 mL/min — ABNORMAL LOW (ref >60.00)
Glucose, Bld: 121 mg/dL — ABNORMAL HIGH (ref 70–99)
Potassium: 4.9 mEq/L (ref 3.5–5.1)
Sodium: 138 mEq/L (ref 135–145)
Total Bilirubin: 0.5 mg/dL (ref 0.2–1.2)
Total Protein: 6.2 g/dL (ref 6.0–8.3)

## 2021-06-28 LAB — CBC
HCT: 28.4 % — ABNORMAL LOW (ref 39.0–52.0)
Hemoglobin: 9.7 g/dL — ABNORMAL LOW (ref 13.0–17.0)
MCHC: 34.2 g/dL (ref 30.0–36.0)
MCV: 105.3 fl — ABNORMAL HIGH (ref 78.0–100.0)
Platelets: 204 10*3/uL (ref 150.0–400.0)
RBC: 2.7 Mil/uL — ABNORMAL LOW (ref 4.22–5.81)
RDW: 13.5 % (ref 11.5–15.5)
WBC: 3.9 10*3/uL — ABNORMAL LOW (ref 4.0–10.5)

## 2021-06-28 LAB — HEMOGLOBIN A1C: Hgb A1c MFr Bld: 6.2 % (ref 4.6–6.5)

## 2021-06-28 LAB — TSH: TSH: 1.33 u[IU]/mL (ref 0.35–5.50)

## 2021-06-28 NOTE — Assessment & Plan Note (Addendum)
Check calcium and PTH levels today. HE is on calcitriol 0.82mg daily.  ?

## 2021-06-28 NOTE — Patient Instructions (Signed)
It was very nice to see you today! ? ?We will check blood work today.  Please continue work on diet and exercise. ? ?I will see you back in 1 year.  Please come in to see me sooner if needed. ? ?Take care, ?Dr Jerline Pain ? ?PLEASE NOTE: ? ?If you had any lab tests please let us know if you have not heard back within a few days. You may see your results on mychart before we have a chance to review them but we will give you a call once they are reviewed by [place]. If we ordered any referrals today, please let us know if you have not heard from their office within the next week.  ? ?Please try these tips to maintain a healthy lifestyle: ? ?Eat at least 3 REAL meals and 1-2 snacks per day.  Aim for no more than 5 hours between eating.  If you eat breakfast, please do so within one hour of getting up.  ? ?Each meal should contain half fruits/vegetables, one quarter protein, and one quarter carbs (no bigger than a computer mouse) ? ?Cut down on sweet beverages. This includes juice, soda, and sweet tea.  ? ?Drink at least 1 glass of water with each meal and aim for at least 8 glasses per day ? ?Exercise at least 150 minutes every week.   ? ?Preventive Care 66 Years and Older, Male ?Preventive care refers to lifestyle choices and visits with your health care provider that can promote health and wellness. Preventive care visits are also called wellness exams. ?What can I expect for my preventive care visit? ?Counseling ?During your preventive care visit, your health care provider may ask about your: ?Medical history, including: ?Past medical problems. ?Family medical history. ?History of falls. ?Current health, including: ?Emotional well-being. ?Home life and relationship well-being. ?Sexual activity. ?Memory and ability to understand (cognition). ?Lifestyle, including: ?Alcohol, nicotine or tobacco, and drug use. ?Access to firearms. ?Diet, exercise, and sleep habits. ?Work and work Statistician. ?Sunscreen use. ?Safety issues such  as seatbelt and bike helmet use. ?Physical exam ?Your health care provider will check your: ?Height and weight. These may be used to calculate your BMI (body mass index). BMI is a measurement that tells if you are at a healthy weight. ?Waist circumference. This measures the distance around your waistline. This measurement also tells if you are at a healthy weight and may help predict your risk of certain diseases, such as type 2 diabetes and high blood pressure. ?Heart rate and blood pressure. ?Body temperature. ?Skin for abnormal spots. ?What immunizations do I need? ?Vaccines are usually given at various ages, according to a schedule. Your health care provider will recommend vaccines for you based on your age, medical history, and lifestyle or other factors, such as travel or where you work. ?What tests do I need? ?Screening ?Your health care provider may recommend screening tests for certain conditions. This may include: ?Lipid and cholesterol levels. ?Diabetes screening. This is done by checking your blood sugar (glucose) after you have not eaten for a while (fasting). ?Hepatitis C test. ?Hepatitis B test. ?HIV (human immunodeficiency virus) test. ?STI (sexually transmitted infection) testing, if you are at risk. ?Lung cancer screening. ?Colorectal cancer screening. ?Prostate cancer screening. ?Abdominal aortic aneurysm (AAA) screening. You may need this if you are a current or former smoker. ?Talk with your health care provider about your test results, treatment options, and if necessary, the need for more tests. ?Follow these instructions at home: ?Eating and  drinking ? ?Eat a diet that includes fresh fruits and vegetables, whole grains, lean protein, and low-fat dairy products. Limit your intake of foods with high amounts of sugar, saturated fats, and salt. ?Take vitamin and mineral supplements as recommended by your health care provider. ?Do not drink alcohol if your health care provider tells you not to  drink. ?If you drink alcohol: ?Limit how much you have to 0-2 drinks a day. ?Know how much alcohol is in your drink. In the [place] one drink equals one 12 oz bottle of beer (355 mL), one 5 oz glass of wine (148 mL), or one 1? oz glass of hard liquor (44 mL). ?Lifestyle ?Brush your teeth every morning and night with fluoride toothpaste. Floss one time each day. ?Exercise for at least 30 minutes 5 or more days each week. ?Do not use any products that contain nicotine or tobacco. These products include cigarettes, chewing tobacco, and vaping devices, such as e-cigarettes. If you need help quitting, ask your health care provider. ?Do not use drugs. ?If you are sexually active, practice safe sex. Use a condom or other form of protection to prevent STIs. ?Take aspirin only as told by your health care provider. Make sure that you understand how much to take and what form to take. Work with your health care provider to find out whether it is safe and beneficial for you to take aspirin daily. ?Ask your health care provider if you need to take a cholesterol-lowering medicine (statin). ?Find healthy ways to manage stress, such as: ?Meditation, yoga, or listening to music. ?Journaling. ?Talking to a trusted person. ?Spending time with friends and family. ?Safety ?Always wear your seat belt while driving or riding in a vehicle. ?Do not drive: ?If you have been drinking alcohol. Do not ride with someone who has been drinking. ?When you are tired or distracted. ?While texting. ?If you have been using any mind-altering substances or drugs. ?Wear a helmet and other protective equipment during sports activities. ?If you have firearms in your house, make sure you follow all gun safety procedures. ?Minimize exposure to UV radiation to reduce your risk of skin cancer. ?What's next? ?Visit your health care provider once a year for an annual wellness visit. ?Ask your health care provider how often you should have your eyes and teeth  checked. ?Stay up to date on all vaccines. ?This information is not intended to replace advice given to you by your health care provider. Make sure you discuss any questions you have with your health care provider. ?Document Revised: 09/09/2020 Document Reviewed: 09/09/2020 ?Elsevier Patient Education ? [place]. ? ?

## 2021-06-28 NOTE — Assessment & Plan Note (Signed)
Continue management per heme-onc.  Check CBC today. ?

## 2021-06-28 NOTE — Assessment & Plan Note (Signed)
Check lipids.  Continue Vytorin and fenofibrate. ?

## 2021-06-28 NOTE — Progress Notes (Signed)
? ?Chief Complaint:  ?Mitchell Alvarado is a 71 y.o. male who presents today for his annual comprehensive physical exam.   ? ?Assessment/Plan:  ?Chronic Problems Addressed Today: ?GERD ?On omeprazole 20 mg once daily.  Tolerating well. ? ?Hypertension associated with diabetes ([place]) ?At goal on Coreg 3.125 mg twice daily and HCTZ 25 mg daily. ? ?Iron deficiency anemia ?Continue management per heme-onc.  Check CBC today. ? ?Dyslipidemia and hypertriglyceridemia associated with type 2 diabetes mellitus ([place]) ?Check lipids.  Continue Vytorin and fenofibrate. ? ?Diabetes ([place]) ?Check A1c.  He has been doing well off medications. ? ?CKD stage 3 secondary to diabetes [facility]) ?Check c-Met. ? ?Vitamin B12 deficiency ?Check B12. ? ?Hyperparathyroidism, secondary ([place]) ?Check calcium and PTH levels today. HE is on calcitriol 0.84mg daily.  ? ?Preventative Healthcare: ?Check labs.  Up-to-date on vaccines and colon cancer screening.  ? ?Patient Counseling(The following topics were reviewed and/or handout was given): ? -Nutrition: Stressed importance of moderation in sodium/caffeine intake, saturated fat and cholesterol, caloric balance, sufficient intake of fresh fruits, vegetables, and fiber. ? -Stressed the importance of regular exercise.  ? -Substance Abuse: Discussed cessation/primary prevention of tobacco, alcohol, or other drug use; driving or other dangerous activities under the influence; availability of treatment for abuse.  ? -Injury prevention: Discussed safety belts, safety helmets, smoke detector, smoking near bedding or upholstery.  ? -Sexuality: Discussed sexually transmitted diseases, partner selection, use of condoms, avoidance of unintended pregnancy and contraceptive alternatives.  ? -Dental health: Discussed importance of regular tooth brushing, flossing, and dental visits. ? -Health maintenance and immunizations reviewed. Please refer to Health maintenance section. ? ?Return to care in 1 year for next  preventative visit.  ? ?  ?Subjective:  ?HPI: ? ?He has no acute complaints today.  See A/P for status of chronic conditions. ? ?Lifestyle ?Diet: Balanced. Plenty of fruits and vegetables.  ?Exercise: Likes to walk.  ? ? ?  06/28/2021  ?  1:21 PM  ?Depression screen PHQ 2/9  ?Decreased Interest 0  ?Down, Depressed, Hopeless 0  ?PHQ - 2 Score 0  ? ? ?Health Maintenance Due  ?Topic Date Due  ? HEMOGLOBIN A1C  12/25/2020  ? URINE MICROALBUMIN  06/25/2021  ?  ? ?ROS: Per HPI, otherwise a complete review of systems was negative.  ? ?PMH: ? ?The following were reviewed and entered/updated in epic: ?Past Medical History:  ?Diagnosis Date  ? Allergy   ? ANEMIA, IRON DEFICIENCY 12/23/2009  ? Barrett's esophagus without dysplasia 05/2012  ? DIABETES MELLITUS, TYPE II 12/30/2006  ? ECZEMA 05/15/2009  ? GERD 12/30/2006  ? HYPERLIPIDEMIA 12/30/2006  ? HYPERTENSION 12/30/2006  ? HYPERTHYROIDISM 06/03/2008  ? Pernicious anemia 12/30/2006  ? Personal history of colonic polyps - adenoma 07/22/2004  ? ?Patient Active Problem List  ? Diagnosis Date Noted  ? Vitamin B12 deficiency 06/25/2020  ? Hyperparathyroidism, secondary ([place] 05/27/2016  ? CKD stage 3 secondary to diabetes ([place] 05/21/2012  ? Iron deficiency anemia 12/23/2009  ? ECZEMA 05/15/2009  ? Diabetes ([place] 12/30/2006  ? Dyslipidemia and hypertriglyceridemia associated with type 2 diabetes mellitus ([place] 12/30/2006  ? Hy[place] with diabetes ([place] 12/30/2006  ? GERD 12/30/2006  ? Personal history of colonic polyps - adenoma 07/22/2004  ? Barrett's esophagus -less than 1 cm, does not meet current criteria for this diagnosis 01/24/2002  ? ?Past Surgical History:  ?Procedure Laterality Date  ? COLONOSCOPY  multiple  ? COLONOSCOPY WITH PROPOFOL  07/27/2015  ? Dr.Gessner  ? ESOPHAGOGASTRODUODENOSCOPY  multiple  ?  POLYPECTOMY    ? TRANSURETHRAL RESECTION OF PROSTATE  03/28/1998  ? ? ?Family History  ?Problem Relation Age of Onset  ? Heart attack Father   ?     MI  ?  Depression Son   ? COPD Son   ? Cancer Neg Hx   ? Colon cancer Neg Hx   ? Esophageal cancer Neg Hx   ? Stomach cancer Neg Hx   ? ? ?Medications- reviewed and updated ?Current Outpatient Medications  ?Medication Sig Dispense Refill  ? aspirin 81 MG tablet Take 1 tablet (81 mg total) by mouth daily. 90 tablet 3  ? calcitRIOL (ROCALTROL) 0.25 MCG capsule TAKE 1 CAPSULE (0.25 MCG TOTAL) BY MOUTH DAILY. TAKE MONDAY WEDNESDAY AND FRIDAY. 36 capsule 3  ? carvedilol (COREG) 3.125 MG tablet TAKE 1 TABLET BY MOUTH TWICE A DAY 180 tablet 0  ? Cholecalciferol (VITAMIN D) 2000 units CAPS Take 2,000 Units by mouth daily.    ? ezetimibe-simvastatin (VYTORIN) 10-80 MG tablet TAKE 1 TABLET BY MOUTH EVERY DAY 90 tablet 1  ? fenofibrate (TRICOR) 145 MG tablet TAKE 1 TABLET BY MOUTH EVERY DAY 90 tablet 0  ? glucose blood (ONETOUCH VERIO) test strip 1 each by Other route daily. And lancets 1/day 100 each 3  ? hydrochlorothiazide (HYDRODIURIL) 25 MG tablet TAKE 1 TABLET BY MOUTH EVERY DAY 90 tablet 0  ? latanoprost (XALATAN) 0.005 % ophthalmic solution Place 1 drop into both eyes at bedtime.    ? omeprazole (PRILOSEC) 20 MG capsule TAKE 1 CAPSULE BY MOUTH EVERY DAY 90 capsule 3  ? vitamin B-12 (CYANOCOBALAMIN) 1000 MCG tablet Take 1,000 mcg by mouth daily.    ? ?No current facility-administered medications for this visit.  ? ? ?Allergies-reviewed and updated ?No Known Allergies ? ?Social History  ? ?Socioeconomic History  ? Marital status: Legally Separated  ?  Spouse name: Not on file  ? Number of children: Not on file  ? Years of education: Not on file  ? Highest education level: Not on file  ?Occupational History  ? Occupation: Retired from school system  ?Tobacco Use  ? Smoking status: Former  ? Smokeless tobacco: Never  ?Vaping Use  ? Vaping Use: Never used  ?Substance and Sexual Activity  ? Alcohol use: Yes  ?  Alcohol/week: 7.0 - 10.0 standard drinks  ?  Types: 7 - 10 Glasses of wine per week  ?  Comment: 7-10 glasses wine weekly   ? Drug use: No  ? Sexual activity: Not on file  ?Other Topics Concern  ? Not on file  ?Social History Narrative  ? Married  ? 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 7 drinks/week  ? Former smoker  ? No drug use  ? ?Social Determinants of Health  ? ?Financial Resource Strain: Low Risk   ? Difficulty of Paying Living Expenses: Not hard at all  ?Food Insecurity: No Food Insecurity  ? Worried About Charity fundraiser in the Last Year: Never true  ? Ran Out of Food in the Last Year: Never true  ?Transportation Needs: No Transportation Needs  ? Lack of Transportation (Medical): No  ? Lack of Transportation (Non-Medical): No  ?Physical Activity: Insufficiently Active  ? Days of Exercise per Week: 3 days  ? Minutes of Exercise per Session: 20 min  ?Stress: No Stress Concern Present  ? Feeling of Stress : Not at all  ?Social Connections: Socially Isolated  ? Frequency  of Communication with Friends and Family: Three times a week  ? Frequency of Social Gatherings with Friends and Family: Twice a week  ? Attends Religious Services: Never  ? Active Member of Clubs or Organizations: No  ? Attends Archivist Meetings: Never  ? Marital Status: Separated  ? ?   ?  ?Objective:  ?Physical Exam: ?BP 134/72 (BP Location: Left Arm)   Pulse 73   Temp 97.9 ?F (36.6 ?C) (Temporal)   Ht 6' 2"  (1.88 m)   Wt 265 lb 9.6 oz (120.5 kg)   SpO2 99%   BMI 34.10 kg/m?   ?Body mass index is 34.1 kg/m?. ?Wt Readings from Last 3 Encounters:  ?06/28/21 265 lb 9.6 oz (120.5 kg)  ?04/05/21 271 lb (122.9 kg)  ?01/29/21 270 lb (122.5 kg)  ? ?Gen: NAD, resting comfortably ?HEENT: TMs normal bilaterally. OP clear. No thyromegaly noted.  ?CV: RRR with no murmurs appreciated ?Pulm: NWOB, CTAB with no crackles, wheezes, or rhonchi ?GI: Normal bowel sounds present. Soft, Nontender, Nondistended. ?MSK: no edema, cyanosis, or clubbing noted ?Skin: warm, dry ?Neuro: CN2-12 grossly intact.  Strength 5/5 in upper and lower extremities. Reflexes symmetric and intact bilaterally.  ?Psych: Normal affect and thought content ?   ? ?Algis Greenhouse. Jerline Pain, MD ?06/28/2021 1:44 PM  ?

## 2021-06-28 NOTE — Assessment & Plan Note (Signed)
At goal on Coreg 3.125 mg twice daily and HCTZ 25 mg daily. ?

## 2021-06-28 NOTE — Assessment & Plan Note (Signed)
Check B12 

## 2021-06-28 NOTE — Assessment & Plan Note (Signed)
On omeprazole 20 mg once daily.  Tolerating well. ?

## 2021-06-28 NOTE — Assessment & Plan Note (Signed)
Check c-Met. 

## 2021-06-28 NOTE — Assessment & Plan Note (Signed)
Check A1c.  He has been doing well off medications. ?

## 2021-06-29 LAB — PTH, INTACT AND CALCIUM
Calcium: 9.7 mg/dL (ref 8.6–10.3)
PTH: 28 pg/mL (ref 16–77)

## 2021-06-29 LAB — MICROALBUMIN / CREATININE URINE RATIO
Creatinine,U: 102.9 mg/dL
Microalb Creat Ratio: 2.3 mg/g (ref 0.0–30.0)
Microalb, Ur: 2.4 mg/dL — ABNORMAL HIGH (ref 0.0–1.9)

## 2021-07-01 NOTE — Progress Notes (Signed)
Please inform patient of the following: ? ?His labs are all stable. Do not need to make any changes to his treatment plan at this time. Would like for him to keep up the good work and we can recheck in a year or so. ? ?Algis Greenhouse. Jerline Pain, MD ?07/01/2021 10:11 AM  ?

## 2021-07-22 ENCOUNTER — Other Ambulatory Visit: Payer: Self-pay | Admitting: Family Medicine

## 2021-07-26 ENCOUNTER — Other Ambulatory Visit: Payer: Self-pay | Admitting: Family Medicine

## 2021-07-26 ENCOUNTER — Telehealth: Payer: Self-pay | Admitting: Hematology

## 2021-07-26 NOTE — Telephone Encounter (Signed)
Rescheduled upcoming appointment due to provider's template change. Patient is aware of changes. ?

## 2021-08-04 ENCOUNTER — Inpatient Hospital Stay: Payer: Medicare PPO | Admitting: Hematology

## 2021-08-04 ENCOUNTER — Inpatient Hospital Stay: Payer: Medicare PPO

## 2021-08-16 ENCOUNTER — Other Ambulatory Visit: Payer: Self-pay | Admitting: Family Medicine

## 2021-08-20 ENCOUNTER — Inpatient Hospital Stay: Payer: Medicare PPO | Attending: Hematology

## 2021-08-20 ENCOUNTER — Other Ambulatory Visit: Payer: Self-pay

## 2021-08-20 ENCOUNTER — Inpatient Hospital Stay: Payer: Medicare PPO | Admitting: Hematology

## 2021-08-20 VITALS — BP 141/68 | HR 87 | Temp 98.0°F | Resp 18 | Ht 74.0 in | Wt 268.7 lb

## 2021-08-20 DIAGNOSIS — D539 Nutritional anemia, unspecified: Secondary | ICD-10-CM | POA: Insufficient documentation

## 2021-08-20 DIAGNOSIS — R7989 Other specified abnormal findings of blood chemistry: Secondary | ICD-10-CM | POA: Insufficient documentation

## 2021-08-20 DIAGNOSIS — Z79899 Other long term (current) drug therapy: Secondary | ICD-10-CM | POA: Diagnosis not present

## 2021-08-20 DIAGNOSIS — E119 Type 2 diabetes mellitus without complications: Secondary | ICD-10-CM | POA: Diagnosis not present

## 2021-08-20 DIAGNOSIS — E538 Deficiency of other specified B group vitamins: Secondary | ICD-10-CM | POA: Diagnosis not present

## 2021-08-20 DIAGNOSIS — E785 Hyperlipidemia, unspecified: Secondary | ICD-10-CM | POA: Insufficient documentation

## 2021-08-20 DIAGNOSIS — Z87891 Personal history of nicotine dependence: Secondary | ICD-10-CM | POA: Diagnosis not present

## 2021-08-20 DIAGNOSIS — D510 Vitamin B12 deficiency anemia due to intrinsic factor deficiency: Secondary | ICD-10-CM

## 2021-08-20 LAB — CMP (CANCER CENTER ONLY)
ALT: 14 U/L (ref 0–44)
AST: 18 U/L (ref 15–41)
Albumin: 4.1 g/dL (ref 3.5–5.0)
Alkaline Phosphatase: 22 U/L — ABNORMAL LOW (ref 38–126)
Anion gap: 9 (ref 5–15)
BUN: 26 mg/dL — ABNORMAL HIGH (ref 8–23)
CO2: 26 mmol/L (ref 22–32)
Calcium: 10.2 mg/dL (ref 8.9–10.3)
Chloride: 104 mmol/L (ref 98–111)
Creatinine: 2.33 mg/dL — ABNORMAL HIGH (ref 0.61–1.24)
GFR, Estimated: 29 mL/min — ABNORMAL LOW (ref >60)
Glucose, Bld: 153 mg/dL — ABNORMAL HIGH (ref 70–99)
Potassium: 4.6 mmol/L (ref 3.5–5.1)
Sodium: 139 mmol/L (ref 135–145)
Total Bilirubin: 0.5 mg/dL (ref 0.3–1.2)
Total Protein: 6.9 g/dL (ref 6.5–8.1)

## 2021-08-20 LAB — CBC WITH DIFFERENTIAL/PLATELET
Abs Immature Granulocytes: 0.01 10*3/uL (ref 0.00–0.07)
Basophils Absolute: 0 10*3/uL (ref 0.0–0.1)
Basophils Relative: 1 %
Eosinophils Absolute: 0.2 10*3/uL (ref 0.0–0.5)
Eosinophils Relative: 4 %
HCT: 28.2 % — ABNORMAL LOW (ref 39.0–52.0)
Hemoglobin: 9.6 g/dL — ABNORMAL LOW (ref 13.0–17.0)
Immature Granulocytes: 0 %
Lymphocytes Relative: 39 %
Lymphs Abs: 2 10*3/uL (ref 0.7–4.0)
MCH: 34.8 pg — ABNORMAL HIGH (ref 26.0–34.0)
MCHC: 34 g/dL (ref 30.0–36.0)
MCV: 102.2 fL — ABNORMAL HIGH (ref 80.0–100.0)
Monocytes Absolute: 0.5 10*3/uL (ref 0.1–1.0)
Monocytes Relative: 9 %
Neutro Abs: 2.5 10*3/uL (ref 1.7–7.7)
Neutrophils Relative %: 47 %
Platelets: 216 10*3/uL (ref 150–400)
RBC: 2.76 MIL/uL — ABNORMAL LOW (ref 4.22–5.81)
RDW: 12.4 % (ref 11.5–15.5)
WBC: 5.2 10*3/uL (ref 4.0–10.5)
nRBC: 0 % (ref 0.0–0.2)

## 2021-08-20 LAB — IRON AND IRON BINDING CAPACITY (CC-WL,HP ONLY)
Iron: 116 ug/dL (ref 45–182)
Saturation Ratios: 38 % (ref 17.9–39.5)
TIBC: 302 ug/dL (ref 250–450)
UIBC: 186 ug/dL (ref 117–376)

## 2021-08-20 LAB — RETICULOCYTES
Immature Retic Fract: 15.3 % (ref 2.3–15.9)
RBC.: 2.76 MIL/uL — ABNORMAL LOW (ref 4.22–5.81)
Retic Count, Absolute: 42.5 10*3/uL (ref 19.0–186.0)
Retic Ct Pct: 1.5 % (ref 0.4–3.1)

## 2021-08-20 LAB — FERRITIN: Ferritin: 1083 ng/mL — ABNORMAL HIGH (ref 24–336)

## 2021-08-20 LAB — VITAMIN B12: Vitamin B-12: 577 pg/mL (ref 180–914)

## 2021-08-25 LAB — VITAMIN B1: Vitamin B1 (Thiamine): 101.4 nmol/L (ref 66.5–200.0)

## 2021-08-26 LAB — HEMOCHROMATOSIS DNA-PCR(C282Y,H63D)

## 2021-08-26 NOTE — Progress Notes (Signed)
HEMATOLOGY/ONCOLOGY CLINIC NOTE  Date of Service: 08/20/2021   Patient Care Team: [name], MD as PCP - General (Family Medicine) [name], MD as Attending Physician (Ophthalmology) [name], MD (Inactive) as Attending Physician (Urology) [name], MD as Consulting Physician (Nephrology) Lyndee Hensen, PT as Physical Therapist (Physical Therapy)  CHIEF COMPLAINTS/PURPOSE OF CONSULTATION:  Follow-up for multifactorial anemia  HISTORY OF PRESENTING ILLNESS:   Mitchell Alvarado is a wonderful 71 y.o. male who has been referred to [place] by Dr Renato Shin for evaluation and management of anemia. The pt reports that he is doing well overall.   The pt reports that he has been taking monthly Vitamin B12 injections for the last 4-5 years, and his deficiency was first realized through routine blood work. He was taken off Metformin at the time. He has taken Omeprazole for 20 years for his Barrett's esophagus. He denies any food intolerances or thyroid problems. He notes that he has had some fatigue which he attributes to getting older.   He has seen a nephrologist several years ago and was discharged after replacing his Vitamin D satisfactorily and he denies any liver problems as well.   Most recent lab results (05/29/17) of CBC  is as follows: all values are WNL except for WBC at 3.5k, RBC at 3.04, HGB at 11.3, HCT at 32.9, MCV at 108.0.  Review of CBC w/diff over the last two years reveals stability in the above abnormalities.   On review of systems, pt reports some fatigue, stable weight, and denies problems of infection, bleeding, back pains, pain along the spine, abdominal pains, changes in bowel habits, leg selling, urinary discomfort, and any other symptoms.   On PMHx the pt reports neuropathy, prostate surgery in 2000, denies Afib. On Social Hx the pt reports 10-14 glasses of wine each week  and denies any concern for excessive consumption. He denies smoking and  smoked cigarettes for one year a long time ago  On Family Hx the pt reports paternal heart attack.  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  He notes no issues with abnormal bleeding or bruising.  No new bone pains.  No new lumps or bumps fevers or chills. Declined significant alcohol intake. No other significant new symptoms. Labs done today were discussed in detail with him.  MEDICAL HISTORY:  Past Medical History:  Diagnosis Date   Allergy    ANEMIA, IRON DEFICIENCY 12/23/2009   Barrett's esophagus without dysplasia 05/2012   DIABETES MELLITUS, TYPE II 12/30/2006   ECZEMA 05/15/2009   GERD 12/30/2006   HYPERLIPIDEMIA 12/30/2006   HYPERTENSION 12/30/2006   HYPERTHYROIDISM 06/03/2008   Pernicious anemia 12/30/2006   Personal history of colonic polyps - adenoma 07/22/2004    SURGICAL HISTORY: Past Surgical History:  Procedure Laterality Date   COLONOSCOPY  multiple   COLONOSCOPY WITH PROPOFOL  07/27/2015   Dr.Gessner   ESOPHAGOGASTRODUODENOSCOPY  multiple   POLYPECTOMY     TRANSURETHRAL RESECTION OF PROSTATE  03/28/1998    SOCIAL HISTORY: Social History   Socioeconomic History   Marital status: Legally Separated    Spouse name: Not on file   Number of children: Not on file   Years of education: Not on file   Highest education level: Not on file  Occupational History   Occupation: Retired from school system  Tobacco Use   Smoking status: Former   Smokeless tobacco: Never  Media planner  Vaping Use: Never used  Substance and Sexual Activity   Alcohol use: Yes    Alcohol/week: 7.0 - 10.0 standard drinks    Types: 7 - 10 Glasses of wine per week    Comment: 7-10 glasses wine weekly   Drug use: No   Sexual activity: Not on file  Other Topics Concern   Not on file  Social History Narrative   Married   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  [employer]   7 drinks/week   Former smoker   No drug use   Social Determinants of Radio broadcast assistant Strain: Low Risk    Difficulty of Paying Living Expenses: Not hard at [employer] Insecurity: No Food Insecurity   Worried About Charity fundraiser in the Last Year: Never true   Arboriculturist in the Last Year: Never true  Transportation Needs: No Transportation Needs   Lack of Transportation (Medical): No   Lack of Transportation (Non-Medical): No  Physical Activity: Insufficiently Active   Days of Exercise per Week: 3 days   Minutes of Exercise per Session: 20 min  Stress: No Stress Concern Present   Feeling of Stress : Not at all  Social Connections: Socially Isolated   Frequency of Communication with Friends and Family: Three times a week   Frequency of Social Gatherings with Friends and Family: Twice a week   Attends Religious Services: Never   Marine scientist or Organizations: No   Attends Music therapist: Never   Marital Status: Separated  Human resources officer Violence: Not At Risk   Fear of Current or Ex-Partner: No   Emotionally Abused: No   Physically Abused: No   Sexually Abused: No    FAMILY HISTORY: Family History  Problem Relation Age of Onset   Heart attack Father        MI   Depression Son    COPD Son    Cancer Neg Hx    Colon cancer Neg Hx    Esophageal cancer Neg Hx    Stomach cancer Neg Hx     ALLERGIES:  has No Known Allergies.  MEDICATIONS:  Current Outpatient Medications  Medication Sig Dispense Refill   aspirin 81 MG tablet Take 1 tablet (81 mg total) by mouth daily. 90 tablet 3   calcitRIOL (ROCALTROL) 0.25 MCG capsule TAKE 1 CAPSULE (0.25 MCG TOTAL) BY MOUTH DAILY. TAKE MONDAY WEDNESDAY AND FRIDAY. 36 capsule 3   carvedilol (COREG) 3.125 MG tablet TAKE 1 TABLET BY MOUTH TWICE A DAY 180 tablet 0   Cholecalciferol (VITAMIN D) 2000 units CAPS Take 2,000 Units by mouth daily.     ezetimibe-simvastatin  (VYTORIN) 10-80 MG tablet TAKE 1 TABLET BY MOUTH EVERY DAY 90 tablet 1   fenofibrate (TRICOR) 145 MG tablet TAKE 1 TABLET BY MOUTH EVERY DAY 90 tablet 0   glucose blood (ONETOUCH VERIO) test strip 1 each by Other route daily. And lancets 1/day 100 each 3   hydrochlorothiazide (HYDRODIURIL) 25 MG tablet TAKE 1 TABLET BY MOUTH EVERY DAY 90 tablet 0   latanoprost (XALATAN) 0.005 % ophthalmic solution Place 1 drop into both eyes at bedtime.     omeprazole (PRILOSEC) 20 MG capsule TAKE 1 CAPSULE BY MOUTH EVERY DAY 90 capsule 3   vitamin B-12 (CYANOCOBALAMIN) 1000 MCG tablet Take 1,000 mcg by mouth daily.     No current facility-administered medications for this visit.    REVIEW OF  SYSTEMS:   10 Point review of Systems was done is negative except as noted above.  PHYSICAL EXAMINATION:  . Vitals:   08/20/21 1127  BP: (!) 141/68  Pulse: 87  Resp: 18  Temp: 98 F (36.7 C)  SpO2: 100%    Filed Weights   08/20/21 1127  Weight: 268 lb 11.2 oz (121.9 kg)    .Body mass index is 34.5 kg/m.  NAD GENERAL:alert, in no acute distress and comfortable SKIN: no acute rashes, no significant lesions EYES: conjunctiva are pink and non-injected, sclera anicteric OROPHARYNX: MMM, no exudates, no oropharyngeal erythema or ulceration NECK: supple, no JVD LYMPH:  no palpable lymphadenopathy in the cervical, axillary or inguinal regions LUNGS: clear to auscultation b/l with normal respiratory effort HEART: regular rate & rhythm ABDOMEN:  normoactive bowel sounds , non tender, not distended. Extremity: no pedal edema PSYCH: alert & oriented x 3 with fluent speech NEURO: no focal motor/sensory deficits   LABORATORY DATA:  I have reviewed the data as listed .    Latest Ref Rng & Units 08/20/2021   10:50 AM 06/28/2021    2:03 PM 04/05/2021    1:41 PM  CBC  WBC 4.0 - 10.5 K/uL 5.2   3.9   3.5    Hemoglobin 13.0 - 17.0 g/dL 9.6   9.7   9.1    Hematocrit 39.0 - 52.0 % 28.2   28.4   26.5     Platelets 150 - 400 K/uL 216   204.0   197     .CBC    Component Value Date/Time   WBC 5.2 08/20/2021 1050   RBC 2.76 (L) 08/20/2021 1050   RBC 2.76 (L) 08/20/2021 1048   HGB 9.6 (L) 08/20/2021 1050   HGB 9.1 (L) 04/05/2021 1341   HCT 28.2 (L) 08/20/2021 1050   HCT 31.1 (L) 09/07/2017 1405   PLT 216 08/20/2021 1050   PLT 197 04/05/2021 1341   MCV 102.2 (H) 08/20/2021 1050   MCH 34.8 (H) 08/20/2021 1050   MCHC 34.0 08/20/2021 1050   RDW 12.4 08/20/2021 1050   LYMPHSABS 2.0 08/20/2021 1050   MONOABS 0.5 08/20/2021 1050   EOSABS 0.2 08/20/2021 1050   BASOSABS 0.0 08/20/2021 1050    .    Latest Ref Rng & Units 08/20/2021   10:50 AM 06/28/2021    2:03 PM 04/05/2021    1:41 PM  CMP  Glucose 70 - 99 mg/dL 153   121   121    BUN 8 - 23 mg/dL '26   21   28    '$ Creatinine 0.61 - 1.24 mg/dL 2.33   1.82   1.98    Sodium 135 - 145 mmol/L 139   138   139    Potassium 3.5 - 5.1 mmol/L 4.6   4.9   4.2    Chloride 98 - 111 mmol/L 104   103   106    CO2 22 - 32 mmol/L '26   23   23    '$ Calcium 8.9 - 10.3 mg/dL 10.2   9.9     9.7   9.2    Total Protein 6.5 - 8.1 g/dL 6.9   6.2   6.6    Total Bilirubin 0.3 - 1.2 mg/dL 0.5   0.5   0.5    Alkaline Phos 38 - 126 U/L '22   22   22    '$ AST 15 - 41 U/L 18   19   24  ALT 0 - 44 U/L '14   13   16     '$ . Lab Results  Component Value Date   IRON 116 08/20/2021   TIBC 302 08/20/2021   IRONPCTSAT 38 08/20/2021   (Iron and TIBC)  Lab Results  Component Value Date   FERRITIN 1,083 (H) 08/20/2021     Component     Latest Ref Rng & Units 09/07/2017  IgG (Immunoglobin G), Serum     700 - 1,600 mg/dL 823  IgA     61 - 437 mg/dL 248  IgM (Immunoglobulin M), Srm     20 - 172 mg/dL 143  Total Protein ELP     6.0 - 8.5 g/dL 6.7  Albumin SerPl Elph-Mcnc     2.9 - 4.4 g/dL 4.0  Alpha 1     0.0 - 0.4 g/dL 0.2  Alpha2 Glob SerPl Elph-Mcnc     0.4 - 1.0 g/dL 0.8  B-Globulin SerPl Elph-Mcnc     0.7 - 1.3 g/dL 1.1  Gamma Glob SerPl Elph-Mcnc      0.4 - 1.8 g/dL 0.6  M Protein SerPl Elph-Mcnc     Not Observed g/dL Not Observed  Globulin, Total     2.2 - 3.9 g/dL 2.7  Albumin/Glob SerPl     0.7 - 1.7 1.5  IFE 1      Comment  Please Note (HCV):      Comment  Folate, Hemolysate     Not Estab. ng/mL 204.0  HCT     37.5 - 51.0 % 31.1 (L)  Folate, RBC     >498 ng/mL 656  Vitamin B12     180 - 914 pg/mL 6,847 (H)  TSH     0.320 - 4.118 uIU/mL 1.016  Ferritin     22 - 316 ng/mL 867 (H)  Copper     72 - 166 ug/dL 97  Parietal Cell Antibody-IgG     0.0 - 20.0 Units 10.9  Intrinsic Factor     0.0 - 1.1 AU/mL 11.8 (H)    RADIOGRAPHIC STUDIES: I have personally reviewed the radiological images as listed and agreed with the findings in the report. No results found.  ASSESSMENT & PLAN:   71 y.o. male with  1. Macrocytic Anemia - multifactorial -- pernicious anemia with anti IF antibodies + Anemia from CKD + cannot r/o an element of low grade MDS ? ? ETOH use  , Low testosterone levels Review of patient's CBCs w/diff over the last two years reveals stability on the above abnormalities  Patient appears to have pernicious anemia with anti intrinsic factor antibodies. Currently no B12 or folate deficiency deficiency.but could have other B vit deficiencies. No monoclonal paraproteinemia. Concern for ETOH use Has some CKD that could be contributing to the anemia as well. No known liver disease No overt bleeding noted.  2. B12 deficiency -multifactorial -- pernicious anemia + previous metformin use + current PPI use.  PLAN:  Lab results done today were discussed with the patient in detail CBC shows stable hemoglobin of 9.6 with normal WBC count of 5.2k and normal platelets of 216k CMP shows increase in his creatinine which is up to 2.33 up from 1.8.  Encouraged to drink more water and continue monitoring this with his primary care physician. B12 levels currently adequate at 577. Ferritin remains elevated at 1083.   Hemochromatosis gene testing negative  We have previously discussed consideration of bone marrow aspiration and biopsy to rule out other etiologies  and also referral to endocrinology for possible consideration of testosterone replacement.  We also discussed that if his hemoglobin remains below 9 that would be a consideration for initiation of EPO.  Continue sublingual B12 2000 mcg daily Continue oral vitamin B complex 1 capsule p.o. daily over-the-counter -Minimize alcohol intake.  3.  Elevated ferritin levels Patient previously had Barrett's esophagus and iron deficiency anemia.  Currently his ferritin is remained elevated likely in the context of significant previous iron replacement. -Hold off on any iron supplementation. HFE gene mutation study is negative for any genetic findings suggestive of hemochromatosis.  4 Patient Active Problem List   Diagnosis Date Noted   Vitamin B12 deficiency 06/25/2020   Hyperparathyroidism, secondary ([place]) 05/27/2016   CKD stage 3 secondary to diabetes ([place]) 05/21/2012   Iron deficiency anemia 12/23/2009   ECZEMA 05/15/2009   Diabetes ([place]) 12/30/2006   Dyslipidemia and hypertriglyceridemia associated with type 2 diabetes mellitus ([place]) 12/30/2006   Hypertension associated with diabetes ([place]) 12/30/2006   GERD 12/30/2006   Personal history of colonic polyps - adenoma 07/22/2004   Barrett's esophagus -less than 1 cm, does not meet current criteria for this diagnosis 01/24/2002  -Mx per PCP   FOLLOW UP: Return to clinic with Dr. Irene Limbo with labs in 6 months    The total time spent in the appointment today was 20 minutes*.  All of the patient's questions were answered with apparent satisfaction. The patient knows to call the clinic with any problems, questions or concerns.   Sullivan Lone MD MS AAHIVMS [facility] Hematology/Oncology Physician [facility]  .*Total Encounter Time as defined by the Centers for Medicare and Medicaid  Services includes, in addition to the face-to-face time of a patient visit (documented in the note above) non-face-to-face time: obtaining and reviewing outside history, ordering and reviewing medications, tests or procedures, care coordination (communications with other health care professionals or caregivers) and documentation in the medical record.

## 2021-10-25 ENCOUNTER — Other Ambulatory Visit: Payer: Self-pay | Admitting: Family Medicine

## 2021-11-03 ENCOUNTER — Other Ambulatory Visit: Payer: Self-pay | Admitting: Family Medicine

## 2021-12-20 ENCOUNTER — Encounter: Payer: Self-pay | Admitting: *Deleted

## 2022-01-06 ENCOUNTER — Encounter: Payer: Self-pay | Admitting: Family Medicine

## 2022-01-27 ENCOUNTER — Ambulatory Visit (INDEPENDENT_AMBULATORY_CARE_PROVIDER_SITE_OTHER): Payer: Medicare PPO

## 2022-01-27 VITALS — Wt 260.0 lb

## 2022-01-27 DIAGNOSIS — Z0000 Encounter for general adult medical examination without abnormal findings: Secondary | ICD-10-CM

## 2022-01-27 NOTE — Progress Notes (Addendum)
I connected with  [place] on 01/27/22 by a audio enabled telemedicine application and verified that I am speaking with the correct person using two identifiers.  Patient Location: Home  Provider Location: Office/Clinic  I discussed the limitations of evaluation and management by telemedicine. The patient expressed understanding and agreed to proceed.   Subjective:   Mitchell Alvarado is a 71 y.o. male who presents for Medicare Annual/Subsequent preventive examination.  Review of Systems     Cardiac Risk Factors include: advanced age (>34mn, >>70women);hypertension;diabetes mellitus;dyslipidemia;male gender;obesity (BMI >30kg/m2)     Objective:    Today's Vitals   01/27/22 1344  Weight: 260 lb (117.9 kg)   Body mass index is 33.38 kg/m.     01/27/2022    1:50 PM 01/14/2021    1:08 PM 01/09/2020   12:53 PM 09/05/2019    2:12 PM 07/11/2019   10:46 AM 12/06/2018    2:35 PM 05/29/2017    1:53 PM  Advanced Directives  Does Patient Have a Medical Advance Directive? Yes Yes Yes No No Yes No  Type of [place]amed[place]of [place] will Healthcare Power of [place] will   Living will   Does patient want to make changes to medical advance directive? No - Patient declined     No - Patient declined   Copy of [place] Chart? Yes - validated most recent copy scanned in chart (See row information) Yes - validated most recent copy scanned in chart (See row information) No - copy requested      Would patient like information on creating a medical advance directive?    No - Patient declined       Current Medications (verified) Outpatient Encounter Medications as of 01/27/2022  Medication Sig   aspirin 81 MG tablet Take 1 tablet (81 mg total) by mouth daily.   calcitRIOL (ROCALTROL) 0.25 MCG capsule TAKE 1 CAPSULE (0.25 MCG TOTAL) BY MOUTH DAILY. TAKE MONDAY WEDNESDAY AND FRIDAY.   carvedilol (COREG) 3.125  MG tablet TAKE 1 TABLET BY MOUTH TWICE A DAY   Cholecalciferol (VITAMIN D) 2000 units CAPS Take 2,000 Units by mouth daily.   ezetimibe-simvastatin (VYTORIN) 10-80 MG tablet Take 1 tablet by mouth daily.   fenofibrate (TRICOR) 145 MG tablet TAKE 1 TABLET BY MOUTH EVERY DAY   glucose blood (ONETOUCH VERIO) test strip 1 each by Other route daily. And lancets 1/day   hydrochlorothiazide (HYDRODIURIL) 25 MG tablet TAKE 1 TABLET BY MOUTH EVERY DAY   latanoprost (XALATAN) 0.005 % ophthalmic solution Place 1 drop into both eyes at bedtime.   omeprazole (PRILOSEC) 20 MG capsule TAKE 1 CAPSULE BY MOUTH EVERY DAY   SPIKEVAX 50 MCG/0.5ML SUSY Inject into the muscle.   vitamin B-12 (CYANOCOBALAMIN) 1000 MCG tablet Take 1,000 mcg by mouth daily.   [DISCONTINUED] ezetimibe (ZETIA) 10 MG tablet Take 10 mg by mouth daily.   [DISCONTINUED] ezetimibe-simvastatin (VYTORIN) 10-80 MG tablet TAKE 1 TABLET BY MOUTH EVERY DAY   No facility-administered encounter medications on file as of 01/27/2022.    Allergies (verified) Patient has no known allergies.   History: Past Medical History:  Diagnosis Date   Allergy    ANEMIA, IRON DEFICIENCY 12/23/2009   Barrett's esophagus without dysplasia 05/2012   DIABETES MELLITUS, TYPE II 12/30/2006   ECZEMA 05/15/2009   GERD 12/30/2006   HYPERLIPIDEMIA 12/30/2006   HYPERTENSION 12/30/2006   HYPERTHYROIDISM 06/03/2008   Pernicious anemia 12/30/2006   Personal  history of colonic polyps - adenoma 07/22/2004   Past Surgical History:  Procedure Laterality Date   COLONOSCOPY  multiple   COLONOSCOPY WITH PROPOFOL  07/27/2015   Dr.Gessner   ESOPHAGOGASTRODUODENOSCOPY  multiple   POLYPECTOMY     TRANSURETHRAL RESECTION OF PROSTATE  03/28/1998   Family History  Problem Relation Age of Onset   Heart attack Father        MI   Depression Son    COPD Son    Cancer Neg Hx    Colon cancer Neg Hx    Esophageal cancer Neg Hx    Stomach cancer Neg Hx    Social History    Socioeconomic History   Marital status: Legally Separated    Spouse name: Not on file   Number of children: Not on file   Years of education: Not on file   Highest education level: Not on file  Occupational History   Occupation: Retired from school system  Tobacco Use   Smoking status: Former   Smokeless tobacco: Never  Scientific laboratory technician Use: Never used  Substance and Sexual Activity   Alcohol use: Yes    Alcohol/week: 7.0 - 10.0 standard drinks of alcohol    Types: 7 - 10 Glasses of wine per week    Comment: 7-10 glasses wine weekly   Drug use: No   Sexual activity: Not on file  Other Topics Concern   Not on file  Social History Narrative   Married   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 [employer]   7 drinks/week   Former smoker   No drug use   Social Determinants of Radio broadcast assistant Strain: Low Risk  (01/27/2022)   Overall Financial Resource Strain (CARDIA)    Difficulty of Paying Living Expenses: Not hard at all  Food Insecurity: No Food Insecurity (01/27/2022)   Hunger Vital Sign    Worried About Running Out of Food in the Last Year: Never true    [place] in the Last Year: Never true  Transportation Needs: No Transportation Needs (01/27/2022)   PRAPARE - Hydrologist (Medical): No    Lack of Transportation (Non-Medical): No  Physical Activity: Insufficiently Active (01/27/2022)   Exercise Vital Sign    Days of Exercise per Week: 4 days    Minutes of Exercise per Session: 20 min  Stress: No Stress Concern Present (01/27/2022)   [place]    Feeling of Stress : Not at all  Social Connections: Moderately Integrated (01/27/2022)   Social Connection and Isolation Panel [NHANES]    Frequency of Communication with Friends and Family: Once a week    Frequency of Social Gatherings with Friends and Family: More than  three times a week    Attends Religious Services: 1 to 4 times per year    Active Member of [employer] or Organizations: Yes    Attends Archivist Meetings: 1 to 4 times per year    Marital Status: Separated    Tobacco Counseling Counseling given: Not Answered   Clinical Intake:  Pre-visit preparation completed: Yes  Pain : No/denies pain     BMI - recorded: 33.38 Nutritional Status: BMI > 30  Obese Nutritional Risks: None Diabetes: Yes CBG done?: No Did pt. bring in CBG monitor from home?: No  How often do you need to have someone help you when  you read instructions, pamphlets, or other written materials from your doctor or pharmacy?: 1 - Never  Diabetic?Nutrition Risk Assessment:  Has the patient had any N/V/D within the last 2 months?  No  Does the patient have any non-healing wounds?  No  Has the patient had any unintentional weight loss or weight gain?  No   Diabetes:  Is the patient diabetic?  Yes  If diabetic, was a CBG obtained today?  No  Did the patient bring in their glucometer from home?  No  How often do you monitor your CBG's? N/A.   Financial Strains and Diabetes Management:  Are you having any financial strains with the device, your supplies or your medication? No .  Does the patient want to be seen by Chronic Care Management for management of their diabetes?  No  Would the patient like to be referred to a Nutritionist or for Diabetic Management?  No   Diabetic Exams:  Diabetic Eye Exam: Overdue for diabetic eye exam. Pt has been advised about the importance in completing this exam. Patient advised to call and schedule an eye exam. Diabetic Foot Exam: Overdue, Pt has been advised about the importance in completing this exam. Pt is scheduled for diabetic foot exam on next appt .   Interpreter Needed?: No  Information entered by :: Charlott Rakes, LPN   Activities of Daily Living    01/27/2022    1:52 PM 01/24/2022   12:04 AM  In your  present state of health, do you have any difficulty performing the following activities:  Hearing? 0 0  Vision? 0 0  Difficulty concentrating or making decisions? 0 0  Walking or climbing stairs? 1 1  Comment at times   Dressing or bathing? 0 0  Doing errands, shopping? 0 0  Preparing Food and eating ? N N  Using the Toilet? N N  In the past six months, have you accidently leaked urine? N N  Do you have problems with loss of bowel control? N N  Managing your Medications? N N  Managing your Finances? N N  Housekeeping or managing your Housekeeping? N N    Patient Care Team: [name], MD as PCP - General (Family Medicine) [name], MD as Attending Physician (Ophthalmology) [name], MD (Inactive) as Attending Physician (Urology) [name], MD as Consulting Physician (Nephrology) Lyndee Hensen, PT as Physical Therapist (Physical Therapy)  Indicate any recent Medical Services you may have received from other than [facility] providers in the past year (date may be approximate).     Assessment:   This is a routine wellness examination for Mitchell Alvarado.  Hearing/Vision screen Hearing Screening - Comments:: Pt denies any  hearing issues  Vision Screening - Comments:: Pt follows up with Dr [name] for annul eye exams   Dietary issues and exercise activities discussed: Current Exercise Habits: Home exercise routine, Type of exercise: walking, Time (Minutes): 20, Frequency (Times/Week): 4, Weekly Exercise (Minutes/Week): 80   Goals Addressed             This Visit's Progress    Patient Stated       Continue to lose weight        Depression Screen    01/27/2022    1:49 PM 06/28/2021    1:21 PM 01/14/2021    1:07 PM 01/09/2020   12:52 PM 06/24/2019    2:41 PM 12/06/2018    2:35 PM 12/06/2018    1:50 PM  PHQ 2/9 Scores  PHQ - 2 Score 0 0 0 0 0 0 0  PHQ- 9 Score     0      Fall Risk    01/27/2022    1:51 PM 01/24/2022   12:04 AM 06/28/2021    1:21  PM 01/14/2021    1:09 PM 01/09/2020   12:55 PM  Fall Risk   Falls in the past year? 1 1 0 0 0  Number falls in past yr: 1 1 0 0 0  Injury with Fall? 1 1 0 0 0  Comment cut on right hand healed      Risk for fall due to : Impaired vision  No Fall Risks Impaired vision Impaired balance/gait;Impaired mobility;Impaired vision  Follow up Falls prevention discussed  Falls evaluation completed Falls prevention discussed Falls prevention discussed    FALL RISK PREVENTION PERTAINING TO THE HOME:  Any stairs in or around the home? Yes  If so, are there any without handrails? No  Home free of loose throw rugs in walkways, pet beds, electrical cords, etc? Yes  Adequate lighting in your home to reduce risk of falls? Yes   ASSISTIVE DEVICES UTILIZED TO PREVENT FALLS:  Life alert? No  Use of a cane, walker or w/c? Yes  Grab bars in the bathroom? No  Shower chair or bench in shower? No  Elevated toilet seat or a handicapped toilet? No   TIMED UP AND GO:  Was the test performed? No .  Cognitive Function:        01/27/2022    1:53 PM 01/14/2021    1:11 PM 01/09/2020   12:56 PM 12/06/2018    2:36 PM  6CIT Screen  What Year? 0 points 0 points 0 points 0 points  What month? 0 points 0 points 0 points 0 points  What time? 0 points 0 points  0 points  Count back from 20 0 points 0 points 0 points 0 points  Months in reverse 0 points 0 points 0 points 0 points  Repeat phrase 0 points 0 points 0 points 0 points  Total Score 0 points 0 points  0 points    Immunizations Immunization History  Administered Date(s) Administered   Fluad Quad(high Dose 65+) 12/06/2018   Influenza Whole 12/26/2008   Influenza,inj,Quad PF,6+ Mos 11/29/2016   Influenza-Unspecified 12/27/2012, 12/06/2013, 03/28/2014, 12/12/2017, 12/08/2020, 12/08/2021   PFIZER Comirnaty(Gray Top)Covid-19 Tri-Sucrose Vaccine 07/17/2020, 01/06/2022   PFIZER(Purple Top)SARS-COV-2 Vaccination 05/04/2019, 05/25/2019, 01/11/2020,  07/17/2020   Pfizer Covid-19 Vaccine Bivalent Booster 58yr & up 12/22/2020, 11/05/2021   Pneumococcal Conjugate-13 05/26/2015   Pneumococcal Polysaccharide-23 05/15/2009, 11/28/2017   Respiratory Syncytial Virus Vaccine,Recomb Aduvanted(Arexvy) 01/06/2022   Td 01/27/1995   Tdap 01/15/2014   Zoster Recombinat (Shingrix) 04/22/2020, 06/23/2020   Zoster, Live 05/26/2015    TDAP status: Up to date  Flu Vaccine status: Up to date  Pneumococcal vaccine status: Up to date  Covid-19 vaccine status: Completed vaccines  Qualifies for Shingles Vaccine? Yes   Zostavax completed Yes   Shingrix Completed?: Yes  Screening Tests Health Maintenance  Topic Date Due   FOOT EXAM  06/23/2020   HEMOGLOBIN A1C  12/28/2021   OPHTHALMOLOGY EXAM  01/27/2022   Diabetic kidney evaluation - Urine ACR  06/29/2022   Diabetic kidney evaluation - GFR measurement  08/21/2022   Medicare Annual Wellness (AWV)  01/28/2023   TETANUS/TDAP  01/16/2024   COLONOSCOPY (Pts 45-422yrInsurance coverage will need to be confirmed)  01/30/2024   Pneumonia Vaccine 6554Years old  Completed   INFLUENZA VACCINE  Completed   COVID-19 Vaccine  Completed   Hepatitis C Screening  Completed   Zoster Vaccines- Shingrix  Completed   HPV VACCINES  Aged Out    Health Maintenance  Health Maintenance Due  Topic Date Due   FOOT EXAM  06/23/2020   HEMOGLOBIN A1C  12/28/2021   OPHTHALMOLOGY EXAM  01/27/2022    Colorectal cancer screening: Type of screening: Colonoscopy. Completed 01/29/21. Repeat every 3 years   Additional Screening:  Hepatitis C Screening:  Completed 05/27/16  Vision Screening: Recommended annual ophthalmology exams for early detection of glaucoma and other disorders of the eye. Is the patient up to date with their annual eye exam?  Yes  Who is the provider or what is the name of the office in which the patient attends annual eye exams? Dr Ellie Lunch  If pt is not established with a provider, would they like  to be referred to a provider to establish care? No .   Dental Screening: Recommended annual dental exams for proper oral hygiene  Community Resource Referral / Chronic Care Management: CRR required this visit?  No   CCM required this visit?  No      Plan:     I have personally reviewed and noted the following in the patient's chart:   Medical and social history Use of alcohol, tobacco or illicit drugs  Current medications and supplements including opioid prescriptions. Patient is not currently taking opioid prescriptions. Functional ability and status Nutritional status Physical activity Advanced directives List of other physicians Hospitalizations, surgeries, and ER visits in previous 12 months Vitals Screenings to include cognitive, depression, and falls Referrals and appointments  In addition, I have reviewed and discussed with patient certain preventive protocols, quality metrics, and best practice recommendations. A written personalized care plan for preventive services as well as general preventive health recommendations were provided to patient.     Willette Brace, LPN   28/09/6809   Nurse Notes: none

## 2022-01-27 NOTE — Patient Instructions (Signed)
Mitchell Alvarado , Thank you for taking time to come for your Medicare Wellness Visit. I appreciate your ongoing commitment to your health goals. Please review the following plan we discussed and let me know if I can assist you in the future.   These are the goals we discussed:  Goals      patient     Lose 15lbs     Patient Stated     Lose 10 lbs      Patient Stated     Continue to lose weight         This is a list of the screening recommended for you and due dates:  Health Maintenance  Topic Date Due   Complete foot exam   06/23/2020   Hemoglobin A1C  12/28/2021   Eye exam for diabetics  01/27/2022   Yearly kidney health urinalysis for diabetes  06/29/2022   Yearly kidney function blood test for diabetes  08/21/2022   Medicare Annual Wellness Visit  01/28/2023   Tetanus Vaccine  01/16/2024   Colon Cancer Screening  01/30/2024   Pneumonia Vaccine  Completed   Flu Shot  Completed   COVID-19 Vaccine  Completed   Hepatitis C Screening: USPSTF Recommendation to screen - Ages 18-79 yo.  Completed   Zoster (Shingles) Vaccine  Completed   HPV Vaccine  Aged Out    Advanced directives: copies in chart   Conditions/risks identified: continue to lose more weight   Next appointment: Follow up in one year for your annual wellness visit.   Preventive Care 25 Years and Older, Male  Preventive care refers to lifestyle choices and visits with your health care provider that can promote health and wellness. What does preventive care include? A yearly physical exam. This is also called an annual well check. Dental exams once or twice a year. Routine eye exams. Ask your health care provider how often you should have your eyes checked. Personal lifestyle choices, including: Daily care of your teeth and gums. Regular physical activity. Eating a healthy diet. Avoiding tobacco and drug use. Limiting alcohol use. Practicing safe sex. Taking low doses of aspirin every day. Taking vitamin  and mineral supplements as recommended by your health care provider. What happens during an annual well check? The services and screenings done by your health care provider during your annual well check will depend on your age, overall health, lifestyle risk factors, and family history of disease. Counseling  Your health care provider may ask you questions about your: Alcohol use. Tobacco use. Drug use. Emotional well-being. Home and relationship well-being. Sexual activity. Eating habits. History of falls. Memory and ability to understand (cognition). Work and work Statistician. Screening  You may have the following tests or measurements: Height, weight, and BMI. Blood pressure. Lipid and cholesterol levels. These may be checked every 5 years, or more frequently if you are over 60 years old. Skin check. Lung cancer screening. You may have this screening every year starting at age 59 if you have a 30-pack-year history of smoking and currently smoke or have quit within the past 15 years. Fecal occult blood test (FOBT) of the stool. You may have this test every year starting at age 28. Flexible sigmoidoscopy or colonoscopy. You may have a sigmoidoscopy every 5 years or a colonoscopy every 10 years starting at age 44. Prostate cancer screening. Recommendations will vary depending on your family history and other risks. Hepatitis C blood test. Hepatitis B blood test. Sexually transmitted disease (STD) testing. Diabetes  screening. This is done by checking your blood sugar (glucose) after you have not eaten for a while (fasting). You may have this done every 1-3 years. Abdominal aortic aneurysm (AAA) screening. You may need this if you are a current or former smoker. Osteoporosis. You may be screened starting at age 64 if you are at high risk. Talk with your health care provider about your test results, treatment options, and if necessary, the need for more tests. Vaccines  Your health care  provider may recommend certain vaccines, such as: Influenza vaccine. This is recommended every year. Tetanus, diphtheria, and acellular pertussis (Tdap, Td) vaccine. You may need a Td booster every 10 years. Zoster vaccine. You may need this after age 7. Pneumococcal 13-valent conjugate (PCV13) vaccine. One dose is recommended after age 33. Pneumococcal polysaccharide (PPSV23) vaccine. One dose is recommended after age 15. Talk to your health care provider about which screenings and vaccines you need and how often you need them. This information is not intended to replace advice given to you by your health care provider. Make sure you discuss any questions you have with your health care provider. Document Released: 04/10/2015 Document Revised: 12/02/2015 Document Reviewed: 01/13/2015 Elsevier Interactive Patient Education  2017 [employer] Prevention in the Home Falls can cause injuries. They can happen to people of all ages. There are many things you can do to make your home safe and to help prevent falls. What can I do on the outside of my home? Regularly fix the edges of walkways and driveways and fix any cracks. Remove anything that might make you trip as you walk through a door, such as a raised step or threshold. Trim any bushes or trees on the path to your home. Use bright outdoor lighting. Clear any walking paths of anything that might make someone trip, such as rocks or tools. Regularly check to see if handrails are loose or broken. Make sure that both sides of any steps have handrails. Any raised decks and porches should have guardrails on the edges. Have any leaves, snow, or ice cleared regularly. Use sand or salt on walking paths during winter. Clean up any spills in your garage right away. This includes oil or grease spills. What can I do in the bathroom? Use night lights. Install grab bars by the toilet and in the tub and shower. Do not use towel bars as grab  bars. Use non-skid mats or decals in the tub or shower. If you need to sit down in the shower, use a plastic, non-slip stool. Keep the floor dry. Clean up any water that spills on the floor as soon as it happens. Remove soap buildup in the tub or shower regularly. Attach bath mats securely with double-sided non-slip rug tape. Do not have throw rugs and other things on the floor that can make you trip. What can I do in the bedroom? Use night lights. Make sure that you have a light by your bed that is easy to reach. Do not use any sheets or blankets that are too big for your bed. They should not hang down onto the floor. Have a firm chair that has side arms. You can use this for support while you get dressed. Do not have throw rugs and other things on the floor that can make you trip. What can I do in the kitchen? Clean up any spills right away. Avoid walking on wet floors. Keep items that you use a lot in easy-to-reach places.  If you need to reach something above you, use a strong step stool that has a grab bar. Keep electrical cords out of the way. Do not use floor polish or wax that makes floors slippery. If you must use wax, use non-skid floor wax. Do not have throw rugs and other things on the floor that can make you trip. What can I do with my stairs? Do not leave any items on the stairs. Make sure that there are handrails on both sides of the stairs and use them. Fix handrails that are broken or loose. Make sure that handrails are as long as the stairways. Check any carpeting to make sure that it is firmly attached to the stairs. Fix any carpet that is loose or worn. Avoid having throw rugs at the top or bottom of the stairs. If you do have throw rugs, attach them to the floor with carpet tape. Make sure that you have a light switch at the top of the stairs and the bottom of the stairs. If you do not have them, ask someone to add them for you. What else can I do to help prevent  falls? Wear shoes that: Do not have high heels. Have rubber bottoms. Are comfortable and fit you well. Are closed at the toe. Do not wear sandals. If you use a stepladder: Make sure that it is fully opened. Do not climb a closed stepladder. Make sure that both sides of the stepladder are locked into place. Ask someone to hold it for you, if possible. Clearly mark and make sure that you can see: Any grab bars or handrails. First and last steps. Where the edge of each step is. Use tools that help you move around (mobility aids) if they are needed. These include: Canes. Walkers. Scooters. Crutches. Turn on the lights when you go into a dark area. Replace any light bulbs as soon as they burn out. Set up your furniture so you have a clear path. Avoid moving your furniture around. If any of your floors are uneven, fix them. If there are any pets around you, be aware of where they are. Review your medicines with your doctor. Some medicines can make you feel dizzy. This can increase your chance of falling. Ask your doctor what other things that you can do to help prevent falls. This information is not intended to replace advice given to you by your health care provider. Make sure you discuss any questions you have with your health care provider. Document Released: 01/08/2009 Document Revised: 08/20/2015 Document Reviewed: 04/18/2014 Elsevier Interactive Patient Education  2017 [employer].

## 2022-01-30 ENCOUNTER — Other Ambulatory Visit: Payer: Self-pay | Admitting: Family Medicine

## 2022-02-08 DIAGNOSIS — H2513 Age-related nuclear cataract, bilateral: Secondary | ICD-10-CM | POA: Diagnosis not present

## 2022-02-08 DIAGNOSIS — H40023 Open angle with borderline findings, high risk, bilateral: Secondary | ICD-10-CM | POA: Diagnosis not present

## 2022-02-08 DIAGNOSIS — H40053 Ocular hypertension, bilateral: Secondary | ICD-10-CM | POA: Diagnosis not present

## 2022-02-08 DIAGNOSIS — E113293 Type 2 diabetes mellitus with mild nonproliferative diabetic retinopathy without macular edema, bilateral: Secondary | ICD-10-CM | POA: Diagnosis not present

## 2022-02-08 DIAGNOSIS — H52203 Unspecified astigmatism, bilateral: Secondary | ICD-10-CM | POA: Diagnosis not present

## 2022-02-08 LAB — HM DIABETES EYE EXAM

## 2022-02-11 ENCOUNTER — Encounter: Payer: Self-pay | Admitting: Family Medicine

## 2022-03-04 ENCOUNTER — Other Ambulatory Visit: Payer: Self-pay

## 2022-03-04 DIAGNOSIS — D539 Nutritional anemia, unspecified: Secondary | ICD-10-CM

## 2022-03-07 ENCOUNTER — Inpatient Hospital Stay: Payer: Medicare PPO | Attending: Hematology

## 2022-03-07 ENCOUNTER — Inpatient Hospital Stay (HOSPITAL_BASED_OUTPATIENT_CLINIC_OR_DEPARTMENT_OTHER): Payer: Medicare PPO | Admitting: Hematology

## 2022-03-07 VITALS — BP 148/68 | HR 90 | Temp 97.6°F | Resp 16 | Wt 260.2 lb

## 2022-03-07 DIAGNOSIS — R790 Abnormal level of blood mineral: Secondary | ICD-10-CM | POA: Diagnosis not present

## 2022-03-07 DIAGNOSIS — Z87891 Personal history of nicotine dependence: Secondary | ICD-10-CM | POA: Insufficient documentation

## 2022-03-07 DIAGNOSIS — D539 Nutritional anemia, unspecified: Secondary | ICD-10-CM | POA: Insufficient documentation

## 2022-03-07 DIAGNOSIS — E538 Deficiency of other specified B group vitamins: Secondary | ICD-10-CM | POA: Diagnosis not present

## 2022-03-07 DIAGNOSIS — D510 Vitamin B12 deficiency anemia due to intrinsic factor deficiency: Secondary | ICD-10-CM | POA: Diagnosis not present

## 2022-03-07 DIAGNOSIS — N189 Chronic kidney disease, unspecified: Secondary | ICD-10-CM | POA: Diagnosis not present

## 2022-03-07 DIAGNOSIS — Z79899 Other long term (current) drug therapy: Secondary | ICD-10-CM | POA: Diagnosis not present

## 2022-03-07 LAB — CMP (CANCER CENTER ONLY)
ALT: 13 U/L (ref 0–44)
AST: 18 U/L (ref 15–41)
Albumin: 3.8 g/dL (ref 3.5–5.0)
Alkaline Phosphatase: 20 U/L — ABNORMAL LOW (ref 38–126)
Anion gap: 7 (ref 5–15)
BUN: 24 mg/dL — ABNORMAL HIGH (ref 8–23)
CO2: 29 mmol/L (ref 22–32)
Calcium: 10 mg/dL (ref 8.9–10.3)
Chloride: 103 mmol/L (ref 98–111)
Creatinine: 1.73 mg/dL — ABNORMAL HIGH (ref 0.61–1.24)
GFR, Estimated: 42 mL/min — ABNORMAL LOW (ref >60)
Glucose, Bld: 138 mg/dL — ABNORMAL HIGH (ref 70–99)
Potassium: 4.5 mmol/L (ref 3.5–5.1)
Sodium: 139 mmol/L (ref 135–145)
Total Bilirubin: 0.4 mg/dL (ref 0.3–1.2)
Total Protein: 6.3 g/dL — ABNORMAL LOW (ref 6.5–8.1)

## 2022-03-07 LAB — CBC WITH DIFFERENTIAL (CANCER CENTER ONLY)
Abs Immature Granulocytes: 0.01 10*3/uL (ref 0.00–0.07)
Basophils Absolute: 0 10*3/uL (ref 0.0–0.1)
Basophils Relative: 1 %
Eosinophils Absolute: 0.1 10*3/uL (ref 0.0–0.5)
Eosinophils Relative: 3 %
HCT: 26.3 % — ABNORMAL LOW (ref 39.0–52.0)
Hemoglobin: 9 g/dL — ABNORMAL LOW (ref 13.0–17.0)
Immature Granulocytes: 0 %
Lymphocytes Relative: 38 %
Lymphs Abs: 1.5 10*3/uL (ref 0.7–4.0)
MCH: 34.4 pg — ABNORMAL HIGH (ref 26.0–34.0)
MCHC: 34.2 g/dL (ref 30.0–36.0)
MCV: 100.4 fL — ABNORMAL HIGH (ref 80.0–100.0)
Monocytes Absolute: 0.4 10*3/uL (ref 0.1–1.0)
Monocytes Relative: 9 %
Neutro Abs: 2 10*3/uL (ref 1.7–7.7)
Neutrophils Relative %: 49 %
Platelet Count: 223 10*3/uL (ref 150–400)
RBC: 2.62 MIL/uL — ABNORMAL LOW (ref 4.22–5.81)
RDW: 12.9 % (ref 11.5–15.5)
WBC Count: 4 10*3/uL (ref 4.0–10.5)
nRBC: 0 % (ref 0.0–0.2)

## 2022-03-07 LAB — IRON AND IRON BINDING CAPACITY (CC-WL,HP ONLY)
Iron: 101 ug/dL (ref 45–182)
Saturation Ratios: 68 % — ABNORMAL HIGH (ref 17.9–39.5)
TIBC: 148 ug/dL — ABNORMAL LOW (ref 250–450)
UIBC: 47 ug/dL — ABNORMAL LOW (ref 117–376)

## 2022-03-07 LAB — FERRITIN: Ferritin: 848 ng/mL — ABNORMAL HIGH (ref 24–336)

## 2022-03-07 LAB — VITAMIN B12: Vitamin B-12: 984 pg/mL — ABNORMAL HIGH (ref 180–914)

## 2022-03-07 LAB — RETICULOCYTES
Immature Retic Fract: 14.1 % (ref 2.3–15.9)
RBC.: 2.62 MIL/uL — ABNORMAL LOW (ref 4.22–5.81)
Retic Count, Absolute: 28.6 10*3/uL (ref 19.0–186.0)
Retic Ct Pct: 1.1 % (ref 0.4–3.1)

## 2022-03-07 NOTE — Progress Notes (Signed)
HEMATOLOGY/ONCOLOGY CLINIC NOTE  Date of Service: 03/07/22    Patient Care Team: [name], MD as PCP - General (Family Medicine) [name], MD as Attending Physician (Ophthalmology) [name], MD (Inactive) as Attending Physician (Urology) [name], MD as Consulting Physician (Nephrology) Lyndee Hensen, PT as Physical Therapist (Physical Therapy)  CHIEF COMPLAINTS/PURPOSE OF CONSULTATION:  Follow-up for multifactorial anemia  HISTORY OF PRESENTING ILLNESS:   Mitchell Alvarado is a wonderful 71 y.o. male who has been referred to [place] by Dr Renato Shin for evaluation and management of anemia. The pt reports that he is doing well overall.   The pt reports that he has been taking monthly Vitamin B12 injections for the last 4-5 years, and his deficiency was first realized through routine blood work. He was taken off Metformin at the time. He has taken Omeprazole for 20 years for his Barrett's esophagus. He denies any food intolerances or thyroid problems. He notes that he has had some fatigue which he attributes to getting older.   He has seen a nephrologist several years ago and was discharged after replacing his Vitamin D satisfactorily and he denies any liver problems as well.   Most recent lab results (05/29/17) of CBC  is as follows: all values are WNL except for WBC at 3.5k, RBC at 3.04, HGB at 11.3, HCT at 32.9, MCV at 108.0.  Review of CBC w/diff over the last two years reveals stability in the above abnormalities.   On review of systems, pt reports some fatigue, stable weight, and denies problems of infection, bleeding, back pains, pain along the spine, abdominal pains, changes in bowel habits, leg selling, urinary discomfort, and any other symptoms.   On PMHx the pt reports neuropathy, prostate surgery in 2000, denies Afib. On Social Hx the pt reports 10-14 glasses of wine each week  and denies any concern for excessive consumption. He denies smoking and  smoked cigarettes for one year a long time ago  On Family Hx the pt reports paternal heart attack.  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    Patient was last seen by me on 08/20/2021 and was doing well overall.   Patient reports he has been doing well without any new medical concerns since our last visit. He denies fever, chills, night sweats, back pain, abdominal pain, abnormal bleeding, sudden new bone pain, or leg swelling. He notes he has lost weight since our last visit due to being physically active and eating less. He does reports of occasional back stiffness.   He reports he had prostate surgery around 2002-2003 for benign enlarged prostate. His testosterone levels were checked around 2 years ago and they were slightly low and has not been checked since.   He reports that his nephrologist released him since our last visit since everything has been stable. He does not have a urologist.   Patient is complaint with his medication. He is regularly taking vitamin-D and B-12 supplements.   MEDICAL HISTORY:  Past Medical History:  Diagnosis Date   Allergy    ANEMIA, IRON DEFICIENCY 12/23/2009   Barrett's esophagus without dysplasia 05/2012   DIABETES MELLITUS, TYPE II 12/30/2006   ECZEMA 05/15/2009   GERD 12/30/2006   HYPERLIPIDEMIA 12/30/2006   HYPERTENSION 12/30/2006   HYPERTHYROIDISM 06/03/2008   Pernicious anemia 12/30/2006   Personal history of colonic polyps - adenoma 07/22/2004    SURGICAL HISTORY: Past Surgical History:  Procedure Laterality Date   COLONOSCOPY  multiple   COLONOSCOPY WITH PROPOFOL  07/27/2015   Dr.Gessner   ESOPHAGOGASTRODUODENOSCOPY  multiple   POLYPECTOMY     TRANSURETHRAL RESECTION OF PROSTATE  03/28/1998    SOCIAL HISTORY: Social History   Socioeconomic History   Marital status: Legally Separated    Spouse name: Not on file   Number of children:  Not on file   Years of education: Not on file   Highest education level: Not on file  Occupational History   Occupation: Retired from school system  Tobacco Use   Smoking status: Former   Smokeless tobacco: Never  Vaping Use   Vaping Use: Never used  Substance and Sexual Activity   Alcohol use: Yes    Alcohol/week: 7.0 - 10.0 standard drinks of alcohol    Types: 7 - 10 Glasses of wine per week    Comment: 7-10 glasses wine weekly   Drug use: No   Sexual activity: Not on file  Other Topics Concern   Not on file  Social History Narrative   Married   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 [employer]   7 drinks/week   Former smoker   No drug use   Social Determinants of Radio broadcast assistant Strain: Low Risk  (01/27/2022)   Overall Financial Resource Strain (CARDIA)    Difficulty of Paying Living Expenses: Not hard at all  Food Insecurity: No Food Insecurity (01/27/2022)   Hunger Vital Sign    Worried About Running Out of Food in the Last Year: Never true    [place] in the Last Year: Never true  Transportation Needs: No Transportation Needs (01/27/2022)   PRAPARE - Hydrologist (Medical): No    Lack of Transportation (Non-Medical): No  Physical Activity: Insufficiently Active (01/27/2022)   Exercise Vital Sign    Days of Exercise per Week: 4 days    Minutes of Exercise per Session: 20 min  Stress: No Stress Concern Present (01/27/2022)   [place]    Feeling of Stress : Not at all  Social Connections: Moderately Integrated (01/27/2022)   Social Connection and Isolation Panel [NHANES]    Frequency of Communication with Friends and Family: Once a week    Frequency of Social Gatherings with Friends and Family: More than three times a week    Attends Religious Services: 1 to 4 times per year    Active Member of [employer] or  Organizations: Yes    Attends Archivist Meetings: 1 to 4 times per year    Marital Status: Separated  Intimate Partner Violence: Not At Risk (01/27/2022)   Humiliation, Afraid, Rape, and Kick questionnaire    Fear of Current or Ex-Partner: No    Emotionally Abused: No    Physically Abused: No    Sexually Abused: No    FAMILY HISTORY: Family History  Problem Relation Age of Onset   Heart attack Father        MI   Depression Son    COPD Son    Cancer Neg Hx    Colon cancer Neg Hx    Esophageal cancer Neg Hx    Stomach cancer Neg Hx     ALLERGIES:  has No Known Allergies.  MEDICATIONS:  Current Outpatient Medications  Medication Sig Dispense Refill   aspirin 81 MG tablet Take 1 tablet (81 mg  total) by mouth daily. 90 tablet 3   calcitRIOL (ROCALTROL) 0.25 MCG capsule TAKE 1 CAPSULE (0.25 MCG TOTAL) BY MOUTH DAILY. TAKE MONDAY WEDNESDAY AND FRIDAY. 36 capsule 3   carvedilol (COREG) 3.125 MG tablet TAKE 1 TABLET BY MOUTH TWICE A DAY 180 tablet 0   Cholecalciferol (VITAMIN D) 2000 units CAPS Take 2,000 Units by mouth daily.     ezetimibe-simvastatin (VYTORIN) 10-80 MG tablet TAKE 1 TABLET BY MOUTH EVERY DAY 90 tablet 1   fenofibrate (TRICOR) 145 MG tablet TAKE 1 TABLET BY MOUTH EVERY DAY 90 tablet 2   glucose blood (ONETOUCH VERIO) test strip 1 each by Other route daily. And lancets 1/day 100 each 3   hydrochlorothiazide (HYDRODIURIL) 25 MG tablet TAKE 1 TABLET BY MOUTH EVERY DAY 90 tablet 2   latanoprost (XALATAN) 0.005 % ophthalmic solution Place 1 drop into both eyes at bedtime.     omeprazole (PRILOSEC) 20 MG capsule TAKE 1 CAPSULE BY MOUTH EVERY DAY 90 capsule 3   SPIKEVAX 50 MCG/0.5ML SUSY Inject into the muscle.     vitamin B-12 (CYANOCOBALAMIN) 1000 MCG tablet Take 1,000 mcg by mouth daily.     No current facility-administered medications for this visit.    REVIEW OF SYSTEMS:   10 Point review of Systems was done is negative except as noted above.  PHYSICAL  EXAMINATION:  . Vitals:   03/07/22 1359  BP: (!) 148/68  Pulse: 90  Resp: 16  Temp: 97.6 F (36.4 C)  SpO2: 100%     Filed Weights   03/07/22 1359  Weight: 260 lb 3.2 oz (118 kg)     .Body mass index is 33.41 kg/m.  NAD GENERAL:alert, in no acute distress and comfortable SKIN: no acute rashes, no significant lesions EYES: conjunctiva are pink and non-injected, sclera anicteric OROPHARYNX: MMM, no exudates, no oropharyngeal erythema or ulceration NECK: supple, no JVD LYMPH:  no palpable lymphadenopathy in the cervical, axillary or inguinal regions LUNGS: clear to auscultation b/l with normal respiratory effort HEART: regular rate & rhythm ABDOMEN:  normoactive bowel sounds , non tender, not distended. Extremity: no pedal edema PSYCH: alert & oriented x 3 with fluent speech NEURO: no focal motor/sensory deficits   LABORATORY DATA:  I have reviewed the data as listed .    Latest Ref Rng & Units 03/07/2022    1:41 PM 08/20/2021   10:50 AM 06/28/2021    2:03 PM  CBC  WBC 4.0 - 10.5 K/uL 4.0  5.2  3.9   Hemoglobin 13.0 - 17.0 g/dL 9.0  9.6  9.7   Hematocrit 39.0 - 52.0 % 26.3  28.2  28.4   Platelets 150 - 400 K/uL 223  216  204.0    .CBC    Component Value Date/Time   WBC 5.2 08/20/2021 1050   RBC 2.76 (L) 08/20/2021 1050   RBC 2.76 (L) 08/20/2021 1048   HGB 9.6 (L) 08/20/2021 1050   HGB 9.1 (L) 04/05/2021 1341   HCT 28.2 (L) 08/20/2021 1050   HCT 31.1 (L) 09/07/2017 1405   PLT 216 08/20/2021 1050   PLT 197 04/05/2021 1341   MCV 102.2 (H) 08/20/2021 1050   MCH 34.8 (H) 08/20/2021 1050   MCHC 34.0 08/20/2021 1050   RDW 12.4 08/20/2021 1050   LYMPHSABS 2.0 08/20/2021 1050   MONOABS 0.5 08/20/2021 1050   EOSABS 0.2 08/20/2021 1050   BASOSABS 0.0 08/20/2021 1050    .    Latest Ref Rng & Units 03/07/2022  1:41 PM 08/20/2021   10:50 AM 06/28/2021    2:03 PM  CMP  Glucose 70 - 99 mg/dL 138  153  121   BUN 8 - 23 mg/dL '24  26  21   '$ Creatinine 0.61 -  1.24 mg/dL 1.73  2.33  1.82   Sodium 135 - 145 mmol/L 139  139  138   Potassium 3.5 - 5.1 mmol/L 4.5  4.6  4.9   Chloride 98 - 111 mmol/L 103  104  103   CO2 22 - 32 mmol/L '29  26  23   '$ Calcium 8.9 - 10.3 mg/dL 10.0  10.2  9.9    9.7   Total Protein 6.5 - 8.1 g/dL 6.3  6.9  6.2   Total Bilirubin 0.3 - 1.2 mg/dL 0.4  0.5  0.5   Alkaline Phos 38 - 126 U/L '20  22  22   '$ AST 15 - 41 U/L '18  18  19   '$ ALT 0 - 44 U/L '13  14  13    '$ . Lab Results  Component Value Date   IRON 101 03/07/2022   TIBC 148 (L) 03/07/2022   IRONPCTSAT 68 (H) 03/07/2022   (Iron and TIBC)  Lab Results  Component Value Date   FERRITIN 848 (H) 03/07/2022     Component     Latest Ref Rng & Units 09/07/2017  IgG (Immunoglobin G), Serum     700 - 1,600 mg/dL 823  IgA     61 - 437 mg/dL 248  IgM (Immunoglobulin M), Srm     20 - 172 mg/dL 143  Total Protein ELP     6.0 - 8.5 g/dL 6.7  Albumin SerPl Elph-Mcnc     2.9 - 4.4 g/dL 4.0  Alpha 1     0.0 - 0.4 g/dL 0.2  Alpha2 Glob SerPl Elph-Mcnc     0.4 - 1.0 g/dL 0.8  B-Globulin SerPl Elph-Mcnc     0.7 - 1.3 g/dL 1.1  Gamma Glob SerPl Elph-Mcnc     0.4 - 1.8 g/dL 0.6  M Protein SerPl Elph-Mcnc     Not Observed g/dL Not Observed  Globulin, Total     2.2 - 3.9 g/dL 2.7  Albumin/Glob SerPl     0.7 - 1.7 1.5  IFE 1      Comment  Please Note (HCV):      Comment  Folate, Hemolysate     Not Estab. ng/mL 204.0  HCT     37.5 - 51.0 % 31.1 (L)  Folate, RBC     >498 ng/mL 656  Vitamin B12     180 - 914 pg/mL 6,847 (H)  TSH     0.320 - 4.118 uIU/mL 1.016  Ferritin     22 - 316 ng/mL 867 (H)  Copper     72 - 166 ug/dL 97  Parietal Cell Antibody-IgG     0.0 - 20.0 Units 10.9  Intrinsic Factor     0.0 - 1.1 AU/mL 11.8 (H)    RADIOGRAPHIC STUDIES: I have personally reviewed the radiological images as listed and agreed with the findings in the report. No results found.  ASSESSMENT & PLAN:   71 y.o. male with  1. Macrocytic Anemia -  multifactorial -- pernicious anemia with anti IF antibodies + Anemia from CKD + cannot r/o an element of low grade MDS ? ? ETOH use  , Low testosterone levels Review of patient's CBCs w/diff over the last two years reveals  stability on the above abnormalities  Patient appears to have pernicious anemia with anti intrinsic factor antibodies. Currently no B12 or folate deficiency deficiency.but could have other B vit deficiencies. No monoclonal paraproteinemia. Concern for ETOH use Has some CKD that could be contributing to the anemia as well. No known liver disease No overt bleeding noted.  2. B12 deficiency -multifactorial -- pernicious anemia + previous metformin use + current PPI use.  3.  Elevated ferritin levels Patient previously had Barrett's esophagus and iron deficiency anemia.  Currently his ferritin is remained elevated likely in the context of significant previous iron replacement. -Hold off on any iron supplementation. HFE gene mutation study is negative for any genetic findings suggestive of hemochromatosis.  PLAN: -Discussed lab results from today with the patient. CBC shows WBC of 4.0 K, hemoglobin of 9.0 K, and platelets of 223. CMP shows blood glucose of 138, BUN of 24, and creatinine of 1.73.  -discussed the option to get testosterone gel and how it can help him with feeling less tired.  -Patient will talk to his PCP Dr. Jerline Pain about testosterone replacement and if needed referral to endocrinology. Testosterone replacement might help increase his hgb 1-2 gm -We have previously discussed consideration of bone marrow aspiration and biopsy to rule out other etiologies and also referral to endocrinology for possible consideration of testosterone replacement. -Discussed the need of EPO if hemoglobin is below 9. Despite addressing testosterone replacement. -continue SL B12 1000 mcg daily and B complex 1 capo po daily  FOLLOW UP: RTC with Dr Irene Limbo with labs in 6 months  The total  time spent in the appointment was 20 minutes* .  All of the patient's questions were answered with apparent satisfaction. The patient knows to call the clinic with any problems, questions or concerns.   Sullivan Lone MD MS AAHIVMS [facility] Hematology/Oncology Physician [facility]  .*Total Encounter Time as defined by the Centers for Medicare and Medicaid Services includes, in addition to the face-to-face time of a patient visit (documented in the note above) non-face-to-face time: obtaining and reviewing outside history, ordering and reviewing medications, tests or procedures, care coordination (communications with other health care professionals or caregivers) and documentation in the medical record.   I, Cleda Mccreedy, am acting as a Education administrator for Sullivan Lone, MD.  .I have reviewed the above documentation for accuracy and completeness, and I agree with the above. Brunetta Genera MD

## 2022-04-22 ENCOUNTER — Other Ambulatory Visit: Payer: Self-pay | Admitting: Family Medicine

## 2022-05-02 ENCOUNTER — Other Ambulatory Visit: Payer: Self-pay | Admitting: Family Medicine

## 2022-06-26 ENCOUNTER — Other Ambulatory Visit: Payer: Self-pay | Admitting: Family Medicine

## 2022-07-07 ENCOUNTER — Ambulatory Visit (INDEPENDENT_AMBULATORY_CARE_PROVIDER_SITE_OTHER): Payer: Medicare PPO | Admitting: Family Medicine

## 2022-07-07 ENCOUNTER — Encounter: Payer: Self-pay | Admitting: Family Medicine

## 2022-07-07 VITALS — BP 122/78 | HR 78 | Temp 97.7°F | Ht 74.0 in | Wt 255.8 lb

## 2022-07-07 DIAGNOSIS — N183 Chronic kidney disease, stage 3 unspecified: Secondary | ICD-10-CM

## 2022-07-07 DIAGNOSIS — R7989 Other specified abnormal findings of blood chemistry: Secondary | ICD-10-CM | POA: Diagnosis not present

## 2022-07-07 DIAGNOSIS — E1159 Type 2 diabetes mellitus with other circulatory complications: Secondary | ICD-10-CM | POA: Diagnosis not present

## 2022-07-07 DIAGNOSIS — E1122 Type 2 diabetes mellitus with diabetic chronic kidney disease: Secondary | ICD-10-CM

## 2022-07-07 DIAGNOSIS — E538 Deficiency of other specified B group vitamins: Secondary | ICD-10-CM

## 2022-07-07 DIAGNOSIS — N2581 Secondary hyperparathyroidism of renal origin: Secondary | ICD-10-CM | POA: Diagnosis not present

## 2022-07-07 DIAGNOSIS — E785 Hyperlipidemia, unspecified: Secondary | ICD-10-CM

## 2022-07-07 DIAGNOSIS — Z0001 Encounter for general adult medical examination with abnormal findings: Secondary | ICD-10-CM

## 2022-07-07 DIAGNOSIS — E1169 Type 2 diabetes mellitus with other specified complication: Secondary | ICD-10-CM | POA: Diagnosis not present

## 2022-07-07 DIAGNOSIS — I152 Hypertension secondary to endocrine disorders: Secondary | ICD-10-CM

## 2022-07-07 LAB — LIPID PANEL
Cholesterol: 178 mg/dL (ref 0–200)
HDL: 88.8 mg/dL (ref >39.00)
LDL Cholesterol: 75 mg/dL (ref 0–99)
NonHDL: 89.43
Total CHOL/HDL Ratio: 2
Triglycerides: 74 mg/dL (ref 0.0–149.0)
VLDL: 14.8 mg/dL (ref 0.0–40.0)

## 2022-07-07 LAB — CBC
HCT: 28.6 % — ABNORMAL LOW (ref 39.0–52.0)
Hemoglobin: 9.8 g/dL — ABNORMAL LOW (ref 13.0–17.0)
MCHC: 34.2 g/dL (ref 30.0–36.0)
MCV: 103.6 fl — ABNORMAL HIGH (ref 78.0–100.0)
Platelets: 224 10*3/uL (ref 150.0–400.0)
RBC: 2.76 Mil/uL — ABNORMAL LOW (ref 4.22–5.81)
RDW: 13.7 % (ref 11.5–15.5)
WBC: 4.2 10*3/uL (ref 4.0–10.5)

## 2022-07-07 LAB — COMPREHENSIVE METABOLIC PANEL
ALT: 14 U/L (ref 0–53)
AST: 20 U/L (ref 0–37)
Albumin: 4 g/dL (ref 3.5–5.2)
Alkaline Phosphatase: 24 U/L — ABNORMAL LOW (ref 39–117)
BUN: 25 mg/dL — ABNORMAL HIGH (ref 6–23)
CO2: 27 mEq/L (ref 19–32)
Calcium: 9.9 mg/dL (ref 8.4–10.5)
Chloride: 103 mEq/L (ref 96–112)
Creatinine, Ser: 1.87 mg/dL — ABNORMAL HIGH (ref 0.40–1.50)
GFR: 35.57 mL/min — ABNORMAL LOW (ref >60.00)
Glucose, Bld: 131 mg/dL — ABNORMAL HIGH (ref 70–99)
Potassium: 5.2 mEq/L — ABNORMAL HIGH (ref 3.5–5.1)
Sodium: 139 mEq/L (ref 135–145)
Total Bilirubin: 0.5 mg/dL (ref 0.2–1.2)
Total Protein: 6.3 g/dL (ref 6.0–8.3)

## 2022-07-07 LAB — TSH: TSH: 2.1 u[IU]/mL (ref 0.35–5.50)

## 2022-07-07 LAB — MICROALBUMIN / CREATININE URINE RATIO
Creatinine,U: 78.1 mg/dL
Microalb Creat Ratio: 5 mg/g (ref 0.0–30.0)
Microalb, Ur: 3.9 mg/dL — ABNORMAL HIGH (ref 0.0–1.9)

## 2022-07-07 LAB — TESTOSTERONE: Testosterone: 139.32 ng/dL — ABNORMAL LOW (ref 300.00–890.00)

## 2022-07-07 LAB — HEMOGLOBIN A1C: Hgb A1c MFr Bld: 6.2 % (ref 4.6–6.5)

## 2022-07-07 LAB — VITAMIN B12: Vitamin B-12: 827 pg/mL (ref 211–911)

## 2022-07-07 NOTE — Assessment & Plan Note (Addendum)
Initially elevated but at goal on recheck.  Continue Coreg 3.125 mg twice daily and HCTZ 25 mg daily.  He will monitor at home and let us know if persistently elevated.

## 2022-07-07 NOTE — Assessment & Plan Note (Signed)
Check lipids. On Vytorin and fenofibrate.  Tolerating well.

## 2022-07-07 NOTE — Assessment & Plan Note (Signed)
Check B12 

## 2022-07-07 NOTE — Progress Notes (Signed)
Chief Complaint:  Mitchell Alvarado is a 72 y.o. male who presents today for his annual comprehensive physical exam.    Assessment/Plan:  Chronic Problems Addressed Today: Low testosterone He is not sure if he wishes to pursue replacement at this time however he does like to get lab checked today.  Depending on results may consider referral to urology or endocrinology.  Hyperparathyroidism, secondary (HCC) Check calcium and PTH.  On calcitriol 0.25 mcg daily.  Diabetes (HCC) Check A1c.Not currently on any medications.  Dyslipidemia and hypertriglyceridemia associated with type 2 diabetes mellitus (HCC) Check lipids. On Vytorin and fenofibrate.  Tolerating well.  CKD stage 3 secondary to diabetes [facility]) Check labs.  Avoid nephrotoxic meds.  Vitamin B12 deficiency Check B12.  Hypertension associated with diabetes (HCC) Initially elevated but at goal on recheck.  Continue Coreg 3.125 mg twice daily and HCTZ 25 mg daily.  He will monitor at home and let us know if persistently elevated.  Preventative Healthcare: Check labs.  Due for colonoscopy next year.  Up-to-date on vaccines.  Patient Counseling(The following topics were reviewed and/or handout was given):  -Nutrition: Stressed importance of moderation in sodium/caffeine intake, saturated fat and cholesterol, caloric balance, sufficient intake of fresh fruits, vegetables, and fiber.  -Stressed the importance of regular exercise.   -Substance Abuse: Discussed cessation/primary prevention of tobacco, alcohol, or other drug use; driving or other dangerous activities under the influence; availability of treatment for abuse.   -Injury prevention: Discussed safety belts, safety helmets, smoke detector, smoking near bedding or upholstery.   -Sexuality: Discussed sexually transmitted diseases, partner selection, use of condoms, avoidance of unintended pregnancy and contraceptive alternatives.   -Dental health: Discussed importance of  regular tooth brushing, flossing, and dental visits.  -Health maintenance and immunizations reviewed. Please refer to Health maintenance section.  Return to care in 1 year for next preventative visit.     Subjective:  HPI:  He has no acute complaints today. He is doing well.  He has seen hematology since her last visit and they are monitoring his microcytic anemia.  They have told him to consider getting testosterone replacement to help with his energy levels and boost hemoglobin.  He is considering this though is unsure.  Lifestyle Diet: Balanced. Plenty of fruits and vegetables.  Exercise: None specific but tries to walk when he can.      07/07/2022   10:56 AM  Depression screen PHQ 2/9  Decreased Interest 0  Down, Depressed, Hopeless 0  PHQ - 2 Score 0    Health Maintenance Due  Topic Date Due   FOOT EXAM  06/23/2020   HEMOGLOBIN A1C  12/28/2021   Diabetic kidney evaluation - Urine ACR  06/29/2022     ROS: Per HPI, otherwise a complete review of systems was negative.   PMH:  The following were reviewed and entered/updated in epic: Past Medical History:  Diagnosis Date   Allergy    ANEMIA, IRON DEFICIENCY 12/23/2009   Barrett's esophagus without dysplasia 05/2012   DIABETES MELLITUS, TYPE II 12/30/2006   ECZEMA 05/15/2009   GERD 12/30/2006   HYPERLIPIDEMIA 12/30/2006   HYPERTENSION 12/30/2006   HYPERTHYROIDISM 06/03/2008   Pernicious anemia 12/30/2006   Personal history of colonic polyps - adenoma 07/22/2004   Patient Active Problem List   Diagnosis Date Noted   Low testosterone 07/07/2022   Vitamin B12 deficiency 06/25/2020   Hyperparathyroidism, secondary 05/27/2016   CKD stage 3 secondary to diabetes 05/21/2012   Iron deficiency anemia 12/23/2009  ECZEMA 05/15/2009   Diabetes 12/30/2006   Dyslipidemia and hypertriglyceridemia associated with type 2 diabetes mellitus (HCC) 12/30/2006   Hypertension associated with diabetes (HCC) 12/30/2006   GERD  12/30/2006   Personal history of colonic polyps - adenoma 07/22/2004   Barrett's esophagus -less than 1 cm, does not meet current criteria for this diagnosis 01/24/2002   Past Surgical History:  Procedure Laterality Date   COLONOSCOPY  multiple   COLONOSCOPY WITH PROPOFOL  07/27/2015   Dr.Gessner   ESOPHAGOGASTRODUODENOSCOPY  multiple   POLYPECTOMY     TRANSURETHRAL RESECTION OF PROSTATE  03/28/1998    Family History  Problem Relation Age of Onset   Heart attack Father        MI   Depression Son    COPD Son    Cancer Neg Hx    Colon cancer Neg Hx    Esophageal cancer Neg Hx    Stomach cancer Neg Hx     Medications- reviewed and updated Current Outpatient Medications  Medication Sig Dispense Refill   aspirin 81 MG tablet Take 1 tablet (81 mg total) by mouth daily. 90 tablet 3   calcitRIOL (ROCALTROL) 0.25 MCG capsule TAKE 1 CAPSULE (0.25 MCG TOTAL) BY MOUTH DAILY. TAKE MONDAY WEDNESDAY AND FRIDAY. 36 capsule 0   carvedilol (COREG) 3.125 MG tablet TAKE 1 TABLET (3.125 MG TOTAL) BY MOUTH 2 (TWO) TIMES DAILY. PLEASE CALL [phone] TO SCHEDULE YOUR PHYSICAL IN APRIL FOR FURTHER REFILLS 180 tablet 1   Cholecalciferol (VITAMIN D) 2000 units CAPS Take 2,000 Units by mouth daily.     ezetimibe-simvastatin (VYTORIN) 10-80 MG tablet TAKE 1 TABLET BY MOUTH EVERY DAY 90 tablet 1   fenofibrate (TRICOR) 145 MG tablet TAKE 1 TABLET BY MOUTH EVERY DAY 90 tablet 2   glucose blood (ONETOUCH VERIO) test strip 1 each by Other route daily. And lancets 1/day 100 each 3   hydrochlorothiazide (HYDRODIURIL) 25 MG tablet TAKE 1 TABLET BY MOUTH EVERY DAY 90 tablet 2   latanoprost (XALATAN) 0.005 % ophthalmic solution Place 1 drop into both eyes at bedtime.     omeprazole (PRILOSEC) 20 MG capsule TAKE 1 CAPSULE BY MOUTH EVERY DAY 90 capsule 3   vitamin B-12 (CYANOCOBALAMIN) 1000 MCG tablet Take 1,000 mcg by mouth daily.     SPIKEVAX 50 MCG/0.5ML SUSY Inject into the muscle. (Patient not taking:  Reported on 07/07/2022)     No current facility-administered medications for this visit.    Allergies-reviewed and updated No Known Allergies  Social History   Socioeconomic History   Marital status: Legally Separated    Spouse name: Not on file   Number of children: Not on file   Years of education: Not on file   Highest education level: Not on file  Occupational History   Occupation: Retired from school system  Tobacco Use   Smoking status: Former   Smokeless tobacco: Never  Vaping Use   Vaping Use: Never used  Substance and Sexual Activity   Alcohol use: Yes    Alcohol/week: 7.0 - 10.0 standard drinks of alcohol    Types: 7 - 10 Glasses of wine per week    Comment: 7-10 glasses wine weekly   Drug use: No   Sexual activity: Not on file  Other Topics Concern   Not on file  Social History Narrative   Married   Retired Merchandiser, [place]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, hospitalsecurity chief [employer]   7 drinks/week   Former smoker   No  drug use   Social Determinants of Health   Financial Resource Strain: Low Risk  (01/27/2022)   Overall Financial Resource Strain (CARDIA)    Difficulty of Paying Living Expenses: Not hard at all  Food Insecurity: No Food Insecurity (01/27/2022)   Hunger Vital Sign    Worried About Running Out of Food in the Last Year: Never true    Ran Out of Food in the Last Year: Never true  Transportation Needs: No Transportation Needs (01/27/2022)   PRAPARE - Administrator, Civil Service (Medical): No    Lack of Transportation (Non-Medical): No  Physical Activity: Insufficiently Active (01/27/2022)   Exercise Vital Sign    Days of Exercise per Week: 4 days    Minutes of Exercise per Session: 20 min  Stress: No Stress Concern Present (01/27/2022)   [employer] of Occupational Health - Occupational Stress Questionnaire    Feeling of Stress : Not at all  Social Connections: Moderately Integrated (01/27/2022)   Social Connection and  Isolation Panel [NHANES]    Frequency of Communication with Friends and Family: Once a week    Frequency of Social Gatherings with Friends and Family: More than three times a week    Attends Religious Services: 1 to 4 times per year    Active Member of [employer] or Organizations: Yes    Attends Banker Meetings: 1 to 4 times per year    Marital Status: Separated        Objective:  Physical Exam: BP 122/78   Pulse 78   Temp 97.7 F (36.5 C) (Temporal)   Ht 6\' 2"  (1.88 m)   Wt 255 lb 12.8 oz (116 kg)   SpO2 99%   BMI 32.84 kg/m   Body mass index is 32.84 kg/m. Wt Readings from Last 3 Encounters:  07/07/22 255 lb 12.8 oz (116 kg)  03/07/22 260 lb 3.2 oz (118 kg)  01/27/22 260 lb (117.9 kg)   Gen: NAD, resting comfortably HEENT: TMs normal bilaterally. OP clear. No thyromegaly noted.  CV: RRR with no murmurs appreciated Pulm: NWOB, CTAB with no crackles, wheezes, or rhonchi GI: Normal bowel sounds present. Soft, Nontender, Nondistended. MSK: no edema, cyanosis, or clubbing noted Skin: warm, dry Neuro: CN2-12 grossly intact. Strength 5/5 in upper and lower extremities. Reflexes symmetric and intact bilaterally.  Psych: Normal affect and thought content     Shelise Maron M. Jimmey Ralph, MD 07/07/2022 11:31 AM

## 2022-07-07 NOTE — Assessment & Plan Note (Signed)
Check labs.  Avoid nephrotoxic meds. 

## 2022-07-07 NOTE — Assessment & Plan Note (Signed)
Check A1c.  Not currently on any medications. 

## 2022-07-07 NOTE — Assessment & Plan Note (Signed)
Check calcium and PTH.  On calcitriol 0.25 mcg daily.

## 2022-07-07 NOTE — Assessment & Plan Note (Signed)
He is not sure if he wishes to pursue replacement at this time however he does like to get lab checked today.  Depending on results may consider referral to urology or endocrinology.

## 2022-07-07 NOTE — Patient Instructions (Signed)
It was very nice to see you today!  We will check blood work today.   Please keep an eye on your blood pressure and let me know if persistently elevated.  We will see you back in year for your next physical.  Come back sooner if needed.  Take care, Dr Jimmey Ralph  PLEASE NOTE:  If you had any lab tests, please let us know if you have not heard back within a few days. You may see your results on mychart before we have a chance to review them but we will give you a call once they are reviewed by [place].   If we ordered any referrals today, please let us know if you have not heard from their office within the next week.   If you had any urgent prescriptions sent in today, please check with the pharmacy within an hour of our visit to make sure the prescription was transmitted appropriately.   Please try these tips to maintain a healthy lifestyle:  Eat at least 3 REAL meals and 1-2 snacks per day.  Aim for no more than 5 hours between eating.  If you eat breakfast, please do so within one hour of getting up.   Each meal should contain half fruits/vegetables, one quarter protein, and one quarter carbs (no bigger than a computer mouse)  Cut down on sweet beverages. This includes juice, soda, and sweet tea.   Drink at least 1 glass of water with each meal and aim for at least 8 glasses per day  Exercise at least 150 minutes every week.    Preventive Care 59 Years and Older, Male Preventive care refers to lifestyle choices and visits with your health care provider that can promote health and wellness. Preventive care visits are also called wellness exams. What can I expect for my preventive care visit? Counseling During your preventive care visit, your health care provider may ask about your: Medical history, including: Past medical problems. Family medical history. History of falls. Current health, including: Emotional well-being. Home life and relationship well-being. Sexual  activity. Memory and ability to understand (cognition). Lifestyle, including: Alcohol, nicotine or tobacco, and drug use. Access to firearms. Diet, exercise, and sleep habits. Work and work Astronomer. Sunscreen use. Safety issues such as seatbelt and bike helmet use. Physical exam Your health care provider will check your: Height and weight. These may be used to calculate your BMI (body mass index). BMI is a measurement that tells if you are at a healthy weight. Waist circumference. This measures the distance around your waistline. This measurement also tells if you are at a healthy weight and may help predict your risk of certain diseases, such as type 2 diabetes and high blood pressure. Heart rate and blood pressure. Body temperature. Skin for abnormal spots. What immunizations do I need?  Vaccines are usually given at various ages, according to a schedule. Your health care provider will recommend vaccines for you based on your age, medical history, and lifestyle or other factors, such as travel or where you work. What tests do I need? Screening Your health care provider may recommend screening tests for certain conditions. This may include: Lipid and cholesterol levels. Diabetes screening. This is done by checking your blood sugar (glucose) after you have not eaten for a while (fasting). Hepatitis C test. Hepatitis B test. HIV (human immunodeficiency virus) test. STI (sexually transmitted infection) testing, if you are at risk. Lung cancer screening. Colorectal cancer screening. Prostate cancer screening. Abdominal aortic aneurysm (  AAA) screening. You may need this if you are a current or former smoker. Talk with your health care provider about your test results, treatment options, and if necessary, the need for more tests. Follow these instructions at home: Eating and drinking  Eat a diet that includes fresh fruits and vegetables, whole grains, lean protein, and low-fat  dairy products. Limit your intake of foods with high amounts of sugar, saturated fats, and salt. Take vitamin and mineral supplements as recommended by your health care provider. Do not drink alcohol if your health care provider tells you not to drink. If you drink alcohol: Limit how much you have to 0-2 drinks a day. Know how much alcohol is in your drink. In the [place] one drink equals one 12 oz bottle of beer (355 mL), one 5 oz glass of wine (148 mL), or one 1 oz glass of hard liquor (44 mL). Lifestyle Brush your teeth every morning and night with fluoride toothpaste. Floss one time each day. Exercise for at least 30 minutes 5 or more days each week. Do not use any products that contain nicotine or tobacco. These products include cigarettes, chewing tobacco, and vaping devices, such as e-cigarettes. If you need help quitting, ask your health care provider. Do not use drugs. If you are sexually active, practice safe sex. Use a condom or other form of protection to prevent STIs. Take aspirin only as told by your health care provider. Make sure that you understand how much to take and what form to take. Work with your health care provider to find out whether it is safe and beneficial for you to take aspirin daily. Ask your health care provider if you need to take a cholesterol-lowering medicine (statin). Find healthy ways to manage stress, such as: Meditation, yoga, or listening to music. Journaling. Talking to a trusted person. Spending time with friends and family. Safety Always wear your seat belt while driving or riding in a vehicle. Do not drive: If you have been drinking alcohol. Do not ride with someone who has been drinking. When you are tired or distracted. While texting. If you have been using any mind-altering substances or drugs. Wear a helmet and other protective equipment during sports activities. If you have firearms in your house, make sure you follow all gun safety  procedures. Minimize exposure to UV radiation to reduce your risk of skin cancer. What's next? Visit your health care provider once a year for an annual wellness visit. Ask your health care provider how often you should have your eyes and teeth checked. Stay up to date on all vaccines. This information is not intended to replace advice given to you by your health care provider. Make sure you discuss any questions you have with your health care provider. Document Revised: 09/09/2020 Document Reviewed: 09/09/2020 Elsevier Patient Education  2023 [employer].

## 2022-07-08 LAB — PTH, INTACT AND CALCIUM
Calcium: 9.9 mg/dL (ref 8.6–10.3)
PTH: 49 pg/mL (ref 16–77)

## 2022-07-10 ENCOUNTER — Other Ambulatory Visit: Payer: Self-pay | Admitting: Family Medicine

## 2022-07-11 NOTE — Progress Notes (Signed)
Please inform patient of the following:  His testosterone is still low.  Recommend referral to urology if he wishes to discuss replacement.  His blood counts are low but stable compared to previous values.  We can recheck this in 6 to 12 months.  His kidney function is also borderline but stable compared to previous.  We can also recheck this in 6 to 12 months.  He should make sure that he is getting plenty of fluids.  Blood sugar borderline elevated but also stable.  He should work on diet and exercise and we can recheck this again in 6 months.   All of his other labs are stable and we can recheck in a year.

## 2022-07-22 ENCOUNTER — Other Ambulatory Visit: Payer: Self-pay | Admitting: Family Medicine

## 2022-08-09 DIAGNOSIS — H40053 Ocular hypertension, bilateral: Secondary | ICD-10-CM | POA: Diagnosis not present

## 2022-08-09 DIAGNOSIS — H40023 Open angle with borderline findings, high risk, bilateral: Secondary | ICD-10-CM | POA: Diagnosis not present

## 2022-08-09 DIAGNOSIS — E119 Type 2 diabetes mellitus without complications: Secondary | ICD-10-CM | POA: Diagnosis not present

## 2022-09-12 ENCOUNTER — Other Ambulatory Visit: Payer: Self-pay

## 2022-09-12 DIAGNOSIS — D539 Nutritional anemia, unspecified: Secondary | ICD-10-CM

## 2022-09-13 ENCOUNTER — Inpatient Hospital Stay: Payer: Medicare PPO | Attending: Hematology

## 2022-09-13 ENCOUNTER — Inpatient Hospital Stay (HOSPITAL_BASED_OUTPATIENT_CLINIC_OR_DEPARTMENT_OTHER): Payer: Medicare PPO | Admitting: Hematology

## 2022-09-13 ENCOUNTER — Other Ambulatory Visit: Payer: Self-pay

## 2022-09-13 VITALS — BP 149/55 | HR 77 | Temp 97.8°F | Resp 18 | Wt 257.6 lb

## 2022-09-13 DIAGNOSIS — D649 Anemia, unspecified: Secondary | ICD-10-CM | POA: Diagnosis not present

## 2022-09-13 DIAGNOSIS — K2270 Barrett's esophagus without dysplasia: Secondary | ICD-10-CM | POA: Insufficient documentation

## 2022-09-13 DIAGNOSIS — D539 Nutritional anemia, unspecified: Secondary | ICD-10-CM

## 2022-09-13 DIAGNOSIS — Z79899 Other long term (current) drug therapy: Secondary | ICD-10-CM | POA: Insufficient documentation

## 2022-09-13 DIAGNOSIS — Z87891 Personal history of nicotine dependence: Secondary | ICD-10-CM | POA: Diagnosis not present

## 2022-09-13 DIAGNOSIS — D510 Vitamin B12 deficiency anemia due to intrinsic factor deficiency: Secondary | ICD-10-CM

## 2022-09-13 DIAGNOSIS — E538 Deficiency of other specified B group vitamins: Secondary | ICD-10-CM | POA: Insufficient documentation

## 2022-09-13 LAB — CMP (CANCER CENTER ONLY)
ALT: 11 U/L (ref 0–44)
AST: 14 U/L — ABNORMAL LOW (ref 15–41)
Albumin: 3.8 g/dL (ref 3.5–5.0)
Alkaline Phosphatase: 19 U/L — ABNORMAL LOW (ref 38–126)
Anion gap: 8 (ref 5–15)
BUN: 26 mg/dL — ABNORMAL HIGH (ref 8–23)
CO2: 24 mmol/L (ref 22–32)
Calcium: 9.9 mg/dL (ref 8.9–10.3)
Chloride: 105 mmol/L (ref 98–111)
Creatinine: 2.13 mg/dL — ABNORMAL HIGH (ref 0.61–1.24)
GFR, Estimated: 32 mL/min — ABNORMAL LOW (ref >60)
Glucose, Bld: 145 mg/dL — ABNORMAL HIGH (ref 70–99)
Potassium: 4.7 mmol/L (ref 3.5–5.1)
Sodium: 137 mmol/L (ref 135–145)
Total Bilirubin: 0.5 mg/dL (ref 0.3–1.2)
Total Protein: 6.2 g/dL — ABNORMAL LOW (ref 6.5–8.1)

## 2022-09-13 LAB — CBC WITH DIFFERENTIAL (CANCER CENTER ONLY)
Abs Immature Granulocytes: 0.01 10*3/uL (ref 0.00–0.07)
Basophils Absolute: 0 10*3/uL (ref 0.0–0.1)
Basophils Relative: 1 %
Eosinophils Absolute: 0.1 10*3/uL (ref 0.0–0.5)
Eosinophils Relative: 3 %
HCT: 26.4 % — ABNORMAL LOW (ref 39.0–52.0)
Hemoglobin: 9.1 g/dL — ABNORMAL LOW (ref 13.0–17.0)
Immature Granulocytes: 0 %
Lymphocytes Relative: 35 %
Lymphs Abs: 1.9 10*3/uL (ref 0.7–4.0)
MCH: 34.7 pg — ABNORMAL HIGH (ref 26.0–34.0)
MCHC: 34.5 g/dL (ref 30.0–36.0)
MCV: 100.8 fL — ABNORMAL HIGH (ref 80.0–100.0)
Monocytes Absolute: 0.5 10*3/uL (ref 0.1–1.0)
Monocytes Relative: 9 %
Neutro Abs: 2.8 10*3/uL (ref 1.7–7.7)
Neutrophils Relative %: 52 %
Platelet Count: 215 10*3/uL (ref 150–400)
RBC: 2.62 MIL/uL — ABNORMAL LOW (ref 4.22–5.81)
RDW: 12.7 % (ref 11.5–15.5)
WBC Count: 5.4 10*3/uL (ref 4.0–10.5)
nRBC: 0 % (ref 0.0–0.2)

## 2022-09-13 LAB — IRON AND IRON BINDING CAPACITY (CC-WL,HP ONLY)
Iron: 100 ug/dL (ref 45–182)
Saturation Ratios: 33 % (ref 17.9–39.5)
TIBC: 304 ug/dL (ref 250–450)
UIBC: 204 ug/dL (ref 117–376)

## 2022-09-13 LAB — FERRITIN: Ferritin: 1267 ng/mL — ABNORMAL HIGH (ref 24–336)

## 2022-09-13 LAB — VITAMIN B12: Vitamin B-12: 1121 pg/mL — ABNORMAL HIGH (ref 180–914)

## 2022-09-19 NOTE — Progress Notes (Signed)
HEMATOLOGY/ONCOLOGY CLINIC NOTE  Date of Service:09/13/2022    Patient Care Team: Ardith Dark, MD as PCP - General (Family Medicine) Maris Berger, MD as Attending Physician (Ophthalmology) Barron Alvine, MD (Inactive) as Attending Physician (Urology) Elvis Coil, MD as Consulting Physician (Nephrology) Sedalia Muta, PT as Physical Therapist (Physical Therapy)  CHIEF COMPLAINTS/PURPOSE OF CONSULTATION:  Follow-up for multifactorial anemia  HISTORY OF PRESENTING ILLNESS:   Mitchell Alvarado is a wonderful 72 y.o. male who has been referred to [place] by Dr Romero Belling for evaluation and management of anemia. The pt reports that he is doing well overall.   The pt reports that he has been taking monthly Vitamin B12 injections for the last 4-5 years, and his deficiency was first realized through routine blood work. He was taken off Metformin at the time. He has taken Omeprazole for 20 years for his Barrett's esophagus. He denies any food intolerances or thyroid problems. He notes that he has had some fatigue which he attributes to getting older.   He has seen a nephrologist several years ago and was discharged after replacing his Vitamin D satisfactorily and he denies any liver problems as well.   Most recent lab results (05/29/17) of CBC  is as follows: all values are WNL except for WBC at 3.5k, RBC at 3.04, HGB at 11.3, HCT at 32.9, MCV at 108.0.  Review of CBC w/diff over the last two years reveals stability in the above abnormalities.   On review of systems, pt reports some fatigue, stable weight, and denies problems of infection, bleeding, back pains, pain along the spine, abdominal pains, changes in bowel habits, leg selling, urinary discomfort, and any other symptoms.   On PMHx the pt reports neuropathy, prostate surgery in 2000, denies Afib. On Social Hx the pt reports 10-14 glasses of wine each week  and denies any concern for excessive consumption. He denies smoking and  smoked cigarettes for one year a long time ago  On Family Hx the pt reports paternal heart attack.  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 He notes of chronic fatigue but no acute new changes.  No dizziness no lightheadedness no chest pain or shortness of breath. He had a repeat lab with his primary care physician with concern for low testosterone levels and he was given a referral to urology for consideration of testosterone replacement but wanted to hold off at this time. We discussed the possible need for ESA's if he were to get more anemic especially in the context of his chronic kidney disease. He denies any obvious bleeding or bruising.  MEDICAL HISTORY:  Past Medical History:  Diagnosis Date   Allergy    ANEMIA, IRON DEFICIENCY 12/23/2009   Barrett's esophagus without dysplasia 05/2012   DIABETES MELLITUS, TYPE II 12/30/2006   ECZEMA 05/15/2009   GERD 12/30/2006   HYPERLIPIDEMIA 12/30/2006   HYPERTENSION 12/30/2006   HYPERTHYROIDISM 06/03/2008   Pernicious anemia 12/30/2006   Personal history of colonic polyps - adenoma 07/22/2004    SURGICAL HISTORY: Past Surgical History:  Procedure Laterality Date   COLONOSCOPY  multiple   COLONOSCOPY WITH PROPOFOL  07/27/2015   Dr.Gessner   ESOPHAGOGASTRODUODENOSCOPY  multiple   POLYPECTOMY     TRANSURETHRAL RESECTION OF PROSTATE  03/28/1998    SOCIAL HISTORY: Social History   Socioeconomic History   Marital status: Legally Separated    Spouse name: Not on file   Number  of children: Not on file   Years of education: Not on file   Highest education level: Not on file  Occupational History   Occupation: Retired from school system  Tobacco Use   Smoking status: Former   Smokeless tobacco: Never  Vaping Use   Vaping Use: Never used  Substance and Sexual Activity   Alcohol use: Yes    Alcohol/week: 7.0 - 10.0 standard drinks of alcohol     Types: 7 - 10 Glasses of wine per week    Comment: 7-10 glasses wine weekly   Drug use: No   Sexual activity: Not on file  Other Topics Concern   Not on file  Social History Narrative   Married   Retired Merchandiser, 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, hospital [employer]   7 drinks/week   Former smoker   No drug use   Social Determinants of Corporate investment banker Strain: Low Risk  (01/27/2022)   Overall Financial Resource Strain (CARDIA)    Difficulty of Paying Living Expenses: Not hard at all  Food Insecurity: No Food Insecurity (01/27/2022)   Hunger Vital Sign    Worried About Running Out of Food in the Last Year: Never true    Ran Out of Food in the Last Year: Never true  Transportation Needs: No Transportation Needs (01/27/2022)   PRAPARE - Administrator, Civil Service (Medical): No    Lack of Transportation (Non-Medical): No  Physical Activity: Insufficiently Active (01/27/2022)   Exercise Vital Sign    Days of Exercise per Week: 4 days    Minutes of Exercise per Session: 20 min  Stress: No Stress Concern Present (01/27/2022)   [employer] of Occupational Health - Occupational Stress Questionnaire    Feeling of Stress : Not at all  Social Connections: Moderately Integrated (01/27/2022)   Social Connection and Isolation Panel [NHANES]    Frequency of Communication with Friends and Family: Once a week    Frequency of Social Gatherings with Friends and Family: More than three times a week    Attends Religious Services: 1 to 4 times per year    Active Member of [employer] or Organizations: Yes    Attends Banker Meetings: 1 to 4 times per year    Marital Status: Separated  Intimate Partner Violence: Not At Risk (01/27/2022)   Humiliation, Afraid, Rape, and Kick questionnaire    Fear of Current or Ex-Partner: No    Emotionally Abused: No    Physically Abused: No    Sexually Abused: No    FAMILY HISTORY: Family History  Problem  Relation Age of Onset   Heart attack Father        MI   Depression Son    COPD Son    Cancer Neg Hx    Colon cancer Neg Hx    Esophageal cancer Neg Hx    Stomach cancer Neg Hx     ALLERGIES:  has No Known Allergies.  MEDICATIONS:  Current Outpatient Medications  Medication Sig Dispense Refill   aspirin 81 MG tablet Take 1 tablet (81 mg total) by mouth daily. 90 tablet 3   calcitRIOL (ROCALTROL) 0.25 MCG capsule TAKE 1 CAPSULE (0.25 MCG TOTAL) BY MOUTH DAILY. TAKE MONDAY WEDNESDAY AND FRIDAY. 36 capsule 3   carvedilol (COREG) 3.125 MG tablet TAKE 1 TABLET BY MOUTH TWICE A DAY 180 tablet 0   Cholecalciferol (VITAMIN D) 2000 units CAPS Take 2,000 Units by mouth daily.  ezetimibe-simvastatin (VYTORIN) 10-80 MG tablet TAKE 1 TABLET BY MOUTH EVERY DAY 90 tablet 1   fenofibrate (TRICOR) 145 MG tablet TAKE 1 TABLET BY MOUTH EVERY DAY 90 tablet 2   glucose blood (ONETOUCH VERIO) test strip 1 each by Other route daily. And lancets 1/day 100 each 3   hydrochlorothiazide (HYDRODIURIL) 25 MG tablet TAKE 1 TABLET BY MOUTH EVERY DAY 90 tablet 2   latanoprost (XALATAN) 0.005 % ophthalmic solution Place 1 drop into both eyes at bedtime.     omeprazole (PRILOSEC) 20 MG capsule TAKE 1 CAPSULE BY MOUTH EVERY DAY 90 capsule 3   SPIKEVAX 50 MCG/0.5ML SUSY Inject into the muscle.     vitamin B-12 (CYANOCOBALAMIN) 1000 MCG tablet Take 1,000 mcg by mouth daily.     No current facility-administered medications for this visit.    REVIEW OF SYSTEMS:   10 Point review of Systems was done is negative except as noted above.  PHYSICAL EXAMINATION:  . Vitals:   03/07/22 1359  BP: (!) 148/68  Pulse: 90  Resp: 16  Temp: 97.6 F (36.4 C)  SpO2: 100%     Filed Weights   03/07/22 1359  Weight: 260 lb 3.2 oz (118 kg)     .Body mass index is 33.41 kg/m.  Marland Kitchen GENERAL:alert, in no acute distress and comfortable SKIN: no acute rashes, no significant lesions EYES: conjunctiva are pink and  non-injected, sclera anicteric OROPHARYNX: MMM, no exudates, no oropharyngeal erythema or ulceration NECK: supple, no JVD LYMPH:  no palpable lymphadenopathy in the cervical, axillary or inguinal regions LUNGS: clear to auscultation b/l with normal respiratory effort HEART: regular rate & rhythm ABDOMEN:  normoactive bowel sounds , non tender, not distended. Extremity: no pedal edema PSYCH: alert & oriented x 3 with fluent speech NEURO: no focal motor/sensory deficits     LABORATORY DATA:  I have reviewed the data as listed .    Latest Ref Rng & Units 09/13/2022    1:06 PM 07/07/2022   11:37 AM 03/07/2022    1:41 PM  CBC  WBC 4.0 - 10.5 K/uL 5.4  4.2  4.0   Hemoglobin 13.0 - 17.0 g/dL 9.1  9.8  9.0   Hematocrit 39.0 - 52.0 % 26.4  28.6  26.3   Platelets 150 - 400 K/uL 215  224.0  223    .CBC    Component Value Date/Time   WBC 5.4 09/13/2022 1306   WBC 4.2 07/07/2022 1137   RBC 2.62 (L) 09/13/2022 1306   HGB 9.1 (L) 09/13/2022 1306   HCT 26.4 (L) 09/13/2022 1306   HCT 31.1 (L) 09/07/2017 1405   PLT 215 09/13/2022 1306   MCV 100.8 (H) 09/13/2022 1306   MCH 34.7 (H) 09/13/2022 1306   MCHC 34.5 09/13/2022 1306   RDW 12.7 09/13/2022 1306   LYMPHSABS 1.9 09/13/2022 1306   MONOABS 0.5 09/13/2022 1306   EOSABS 0.1 09/13/2022 1306   BASOSABS 0.0 09/13/2022 1306    .    Latest Ref Rng & Units 09/13/2022    1:06 PM 07/07/2022   11:37 AM 03/07/2022    1:41 PM  CMP  Glucose 70 - 99 mg/dL 347  425  956   BUN 8 - 23 mg/dL 26  25  24    Creatinine 0.61 - 1.24 mg/dL 3.87  5.64  3.32   Sodium 135 - 145 mmol/L 137  139  139   Potassium 3.5 - 5.1 mmol/L 4.7  5.2 No hemolysis seen  4.5  Chloride 98 - 111 mmol/L 105  103  103   CO2 22 - 32 mmol/L 24  27  29    Calcium 8.9 - 10.3 mg/dL 9.9  9.9    9.9  81.1   Total Protein 6.5 - 8.1 g/dL 6.2  6.3  6.3   Total Bilirubin 0.3 - 1.2 mg/dL 0.5  0.5  0.4   Alkaline Phos 38 - 126 U/L 19  24  20    AST 15 - 41 U/L 14  20  18    ALT 0 -  44 U/L 11  14  13     . Lab Results  Component Value Date   IRON 100 09/13/2022   TIBC 304 09/13/2022   IRONPCTSAT 33 09/13/2022   (Iron and TIBC)  Lab Results  Component Value Date   FERRITIN 1,267 (H) 09/13/2022     Component     Latest Ref Rng & Units 09/07/2017  IgG (Immunoglobin G), Serum     700 - 1,600 mg/dL 914  IgA     61 - 782 mg/dL 956  IgM (Immunoglobulin M), Srm     20 - 172 mg/dL 213  Total Protein ELP     6.0 - 8.5 g/dL 6.7  Albumin SerPl Elph-Mcnc     2.9 - 4.4 g/dL 4.0  Alpha 1     0.0 - 0.4 g/dL 0.2  Alpha2 Glob SerPl Elph-Mcnc     0.4 - 1.0 g/dL 0.8  B-Globulin SerPl Elph-Mcnc     0.7 - 1.3 g/dL 1.1  Gamma Glob SerPl Elph-Mcnc     0.4 - 1.8 g/dL 0.6  M Protein SerPl Elph-Mcnc     Not Observed g/dL Not Observed  Globulin, Total     2.2 - 3.9 g/dL 2.7  Albumin/Glob SerPl     0.7 - 1.7 1.5  IFE 1      Comment  Please Note (HCV):      Comment  Folate, Hemolysate     Not Estab. ng/mL 204.0  HCT     37.5 - 51.0 % 31.1 (L)  Folate, RBC     >498 ng/mL 656  Vitamin B12     180 - 914 pg/mL 6,847 (H)  TSH     0.320 - 4.118 uIU/mL 1.016  Ferritin     22 - 316 ng/mL 867 (H)  Copper     72 - 166 ug/dL 97  Parietal Cell Antibody-IgG     0.0 - 20.0 Units 10.9  Intrinsic Factor     0.0 - 1.1 AU/mL 11.8 (H)    RADIOGRAPHIC STUDIES: I have personally reviewed the radiological images as listed and agreed with the findings in the report. No results found.  ASSESSMENT & PLAN:   72 y.o. male with  1. Macrocytic Anemia - multifactorial -- pernicious anemia with anti IF antibodies + Anemia from CKD + cannot r/o an element of low grade MDS ? ? ETOH use  , Low testosterone levels Review of patient's CBCs w/diff over the last two years reveals stability on the above abnormalities  Patient appears to have pernicious anemia with anti intrinsic factor antibodies. Currently no B12 or folate deficiency deficiency.but could have other B vit  deficiencies. No monoclonal paraproteinemia. Concern for ETOH use Has some CKD that could be contributing to the anemia as well. No known liver disease No overt bleeding noted.  2. B12 deficiency -multifactorial -- pernicious anemia + previous metformin use + current PPI use.  3.  Elevated ferritin  levels Patient previously had Barrett's esophagus and iron deficiency anemia.  Currently his ferritin is remained elevated likely in the context of significant previous iron replacement. -Hold off on any iron supplementation. HFE gene mutation study is negative for any genetic findings suggestive of hemochromatosis.  PLAN: -Labs done a were discussed in detail with the patient CBC stable at 9.1 normal WBC count and platelets Iron levels adequate B12 normally replaced. No indication for IV iron replacement at this time. Patient has been referred to urology by his primary care physician for consideration of testosterone replacement. Discussed potential need for ESA's if hemoglobin drops below 9. Follow-up in 6 months to continue to monitor  FOLLOW UP: RTC with Dr Candise Che with labs in 6 months   The total time spent in the appointment was 20 minutes*.  All of the patient's questions were answered with apparent satisfaction. The patient knows to call the clinic with any problems, questions or concerns.   Wyvonnia Lora MD MS AAHIVMS [facility] Hematology/Oncology Physician [facility]  .*Total Encounter Time as defined by the Centers for Medicare and Medicaid Services includes, in addition to the face-to-face time of a patient visit (documented in the note above) non-face-to-face time: obtaining and reviewing outside history, ordering and reviewing medications, tests or procedures, care coordination (communications with other health care professionals or caregivers) and documentation in the medical record.

## 2022-10-03 ENCOUNTER — Other Ambulatory Visit: Payer: Self-pay | Admitting: Family Medicine

## 2022-10-24 ENCOUNTER — Other Ambulatory Visit: Payer: Self-pay | Admitting: Family Medicine

## 2022-12-25 ENCOUNTER — Other Ambulatory Visit: Payer: Self-pay | Admitting: Family Medicine

## 2023-01-21 ENCOUNTER — Other Ambulatory Visit: Payer: Self-pay | Admitting: Family Medicine

## 2023-01-25 ENCOUNTER — Ambulatory Visit: Payer: Medicare PPO

## 2023-01-25 VITALS — Wt 257.0 lb

## 2023-01-25 DIAGNOSIS — Z0000 Encounter for general adult medical examination without abnormal findings: Secondary | ICD-10-CM

## 2023-01-25 NOTE — Progress Notes (Signed)
Subjective:   Mitchell Alvarado is a 72 y.o. male who presents for Medicare Annual/Subsequent preventive examination.  Visit Complete: Virtual I connected with  Mitchell Alvarado on 01/25/23 by a audio enabled telemedicine application and verified that I am speaking with the correct person using two identifiers.  Patient Location: Home  Provider Location: Home Office  I discussed the limitations of evaluation and management by telemedicine. The patient expressed understanding and agreed to proceed.  Vital Signs: Because this visit was a virtual/telehealth visit, some criteria may be missing or patient reported. Any vitals not documented were not able to be obtained and vitals that have been documented are patient reported.  Patient Medicare AWV questionnaire was completed by the patient on 01/20/23; I have confirmed that all information answered by patient is correct and no changes since this date.  Cardiac Risk Factors include: advanced age (>61men, >22 women);diabetes mellitus;dyslipidemia;male gender;hypertension;obesity (BMI >30kg/m2)     Objective:    Today's Vitals   01/25/23 1324  Weight: 257 lb (116.6 kg)   Body mass index is 33 kg/m.     01/25/2023    1:32 PM 01/27/2022    1:50 PM 01/14/2021    1:08 PM 01/09/2020   12:53 PM 09/05/2019    2:12 PM 07/11/2019   10:46 AM 12/06/2018    2:35 PM  Advanced Directives  Does Patient Have a Medical Advance Directive? Yes Yes Yes Yes No No Yes  Type of Estate agent of [place];Living will Healthcare Power of [place];Living will Healthcare Power of [employer] of [place];Living will   Living will  Does patient want to make changes to medical advance directive? No - Patient declined No - Patient declined     No - Patient declined  Copy of Healthcare Power of Attorney in Chart? Yes - validated most recent copy scanned in chart (See row information) Yes - validated most recent copy scanned in chart  (See row information) Yes - validated most recent copy scanned in chart (See row information) No - copy requested     Would patient like information on creating a medical advance directive?     No - Patient declined      Current Medications (verified) Outpatient Encounter Medications as of 01/25/2023  Medication Sig   aspirin 81 MG tablet Take 1 tablet (81 mg total) by mouth daily.   calcitRIOL (ROCALTROL) 0.25 MCG capsule TAKE 1 CAPSULE (0.25 MCG TOTAL) BY MOUTH DAILY. TAKE MONDAY WEDNESDAY AND FRIDAY.   carvedilol (COREG) 3.125 MG tablet PLEASE SEE ATTACHED FOR DETAILED DIRECTIONS   Cholecalciferol (VITAMIN D) 2000 units CAPS Take 2,000 Units by mouth daily.   ezetimibe-simvastatin (VYTORIN) 10-80 MG tablet TAKE 1 TABLET BY MOUTH EVERY DAY   fenofibrate (TRICOR) 145 MG tablet TAKE 1 TABLET BY MOUTH EVERY DAY   glucose blood (ONETOUCH VERIO) test strip 1 each by Other route daily. And lancets 1/day   hydrochlorothiazide (HYDRODIURIL) 25 MG tablet TAKE 1 TABLET BY MOUTH EVERY DAY   latanoprost (XALATAN) 0.005 % ophthalmic solution Place 1 drop into both eyes at bedtime.   omeprazole (PRILOSEC) 20 MG capsule TAKE 1 CAPSULE BY MOUTH EVERY DAY   vitamin B-12 (CYANOCOBALAMIN) 1000 MCG tablet Take 1,000 mcg by mouth daily.   SPIKEVAX 50 MCG/0.5ML SUSY Inject into the muscle. (Patient not taking: Reported on 07/07/2022)   No facility-administered encounter medications on file as of 01/25/2023.    Allergies (verified) Patient has no known allergies.   History: Past  Medical History:  Diagnosis Date   Allergy    ANEMIA, IRON DEFICIENCY 12/23/2009   Barrett's esophagus without dysplasia 05/2012   DIABETES MELLITUS, TYPE II 12/30/2006   ECZEMA 05/15/2009   GERD 12/30/2006   HYPERLIPIDEMIA 12/30/2006   HYPERTENSION 12/30/2006   HYPERTHYROIDISM 06/03/2008   Pernicious anemia 12/30/2006   Personal history of colonic polyps - adenoma 07/22/2004   Past Surgical History:  Procedure Laterality  Date   COLONOSCOPY  multiple   COLONOSCOPY WITH PROPOFOL  07/27/2015   Dr.Gessner   ESOPHAGOGASTRODUODENOSCOPY  multiple   POLYPECTOMY     TRANSURETHRAL RESECTION OF PROSTATE  03/28/1998   Family History  Problem Relation Age of Onset   Heart attack Father        MI   Depression Son    COPD Son    Cancer Neg Hx    Colon cancer Neg Hx    Esophageal cancer Neg Hx    Stomach cancer Neg Hx    Social History   Socioeconomic History   Marital status: Legally Separated    Spouse name: Not on file   Number of children: Not on file   Years of education: Not on file   Highest education level: Not on file  Occupational History   Occupation: Retired from school system  Tobacco Use   Smoking status: Former   Smokeless tobacco: Never  Vaping Use   Vaping status: Never Used  Substance and Sexual Activity   Alcohol use: Yes    Alcohol/week: 7.0 - 10.0 standard drinks of alcohol    Types: 7 - 10 Glasses of wine per week    Comment: 7-10 glasses wine weekly   Drug use: No   Sexual activity: Not on file  Other Topics Concern   Not on file  Social History Narrative   Married   Retired Merchandiser, 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, hospital [employer]   7 drinks/week   Former smoker   No drug use   Social Determinants of Corporate investment banker Strain: Low Risk  (01/20/2023)   Overall Financial Resource Strain (CARDIA)    Difficulty of Paying Living Expenses: Not hard at all  Food Insecurity: No Food Insecurity (01/20/2023)   Hunger Vital Sign    Worried About Running Out of Food in the Last Year: Never true    Ran Out of Food in the Last Year: Never true  Transportation Needs: No Transportation Needs (01/20/2023)   PRAPARE - Administrator, Civil Service (Medical): No    Lack of Transportation (Non-Medical): No  Physical Activity: Inactive (01/20/2023)   Exercise Vital Sign    Days of Exercise per Week: 0 days    Minutes of Exercise per  Session: 0 min  Stress: No Stress Concern Present (01/20/2023)   [employer] of Occupational Health - Occupational Stress Questionnaire    Feeling of Stress : Only a little  Social Connections: Socially Isolated (01/20/2023)   Social Connection and Isolation Panel [NHANES]    Frequency of Communication with Friends and Family: Once a week    Frequency of Social Gatherings with Friends and Family: Never    Attends Religious Services: Never    Database administrator or Organizations: Yes    Attends Banker Meetings: Never    Marital Status: Separated    Tobacco Counseling Counseling given: Not Answered   Clinical Intake:  Pre-visit preparation completed: Yes  Pain : No/denies pain  BMI - recorded: 33 Nutritional Status: BMI > 30  Obese Diabetes: Yes CBG done?: No Did pt. bring in CBG monitor from home?: No  How often do you need to have someone help you when you read instructions, pamphlets, or other written materials from your doctor or pharmacy?: 1 - Never  Interpreter Needed?: No  Information entered by :: Lanier Ensign, LPN   Activities of Daily Living    01/20/2023    5:53 PM 01/27/2022    1:52 PM  In your present state of health, do you have any difficulty performing the following activities:  Hearing? 0 0  Vision? 0 0  Difficulty concentrating or making decisions? 0 0  Walking or climbing stairs? 1 1  Comment issues at time at times  Dressing or bathing? 0 0  Doing errands, shopping? 0 0  Preparing Food and eating ? N N  Using the Toilet? N N  In the past six months, have you accidently leaked urine? N N  Do you have problems with loss of bowel control? N N  Managing your Medications? N N  Managing your Finances? N N  Housekeeping or managing your Housekeeping? N N    Patient Care Team: Ardith Dark, MD as PCP - General (Family Medicine) Maris Berger, MD as Attending Physician (Ophthalmology) Barron Alvine, MD  (Inactive) as Attending Physician (Urology) Elvis Coil, MD as Consulting Physician (Nephrology) Sedalia Muta, PT as Physical Therapist (Physical Therapy)  Indicate any recent Medical Services you may have received from other than [facility] providers in the past year (date may be approximate).     Assessment:   This is a routine wellness examination for [place].  Hearing/Vision screen Hearing Screening - Comments:: Pt denies any hearing issues  Vision Screening - Comments:: Pt follows up with Dr Charlotte Sanes for annual eye exams    Goals Addressed             This Visit's Progress    Patient Stated       Lose weight        Depression Screen    01/25/2023    1:32 PM 07/07/2022   10:56 AM 01/27/2022    1:49 PM 06/28/2021    1:21 PM 01/14/2021    1:07 PM 01/09/2020   12:52 PM 06/24/2019    2:41 PM  PHQ 2/9 Scores  PHQ - 2 Score 0 0 0 0 0 0 0  PHQ- 9 Score       0    Fall Risk    01/20/2023    5:53 PM 07/07/2022   10:56 AM 01/27/2022    1:51 PM 01/24/2022   12:04 AM 06/28/2021    1:21 PM  Fall Risk   Falls in the past year? 0 0 1 1 0  Number falls in past yr: 0 0 1 1 0  Injury with Fall? 0 0 1 1 0  Comment   cut on right hand healed    Risk for fall due to : No Fall Risks No Fall Risks Impaired vision  No Fall Risks  Follow up Falls prevention discussed  Falls prevention discussed  Falls evaluation completed    MEDICARE RISK AT HOME: Medicare Risk at Home Any stairs in or around the home?: Yes If so, are there any without handrails?: No Home free of loose throw rugs in walkways, pet beds, electrical cords, etc?: No Adequate lighting in your home to reduce risk of falls?: Yes Life alert?: No Use of a  cane, walker or w/c?: Yes Grab bars in the bathroom?: No Shower chair or bench in shower?: No Elevated toilet seat or a handicapped toilet?: No  TIMED UP AND GO:  Was the test performed?  No    Cognitive Function:        01/25/2023    1:34 PM 01/27/2022    1:53  PM 01/14/2021    1:11 PM 01/09/2020   12:56 PM 12/06/2018    2:36 PM  6CIT Screen  What Year? 0 points 0 points 0 points 0 points 0 points  What month? 0 points 0 points 0 points 0 points 0 points  What time? 0 points 0 points 0 points  0 points  Count back from 20 0 points 0 points 0 points 0 points 0 points  Months in reverse 0 points 0 points 0 points 0 points 0 points  Repeat phrase 0 points 0 points 0 points 0 points 0 points  Total Score 0 points 0 points 0 points  0 points    Immunizations Immunization History  Administered Date(s) Administered   Fluad Quad(high Dose 65+) 12/06/2018   Fluad Trivalent(High Dose 65+) 12/20/2022   Influenza Whole 12/26/2008   Influenza,inj,Quad PF,6+ Mos 11/29/2016   Influenza-Unspecified 12/27/2012, 12/06/2013, 03/28/2014, 12/12/2017, 12/08/2020, 12/08/2021   PFIZER Comirnaty(Gray Top)Covid-19 Tri-Sucrose Vaccine 07/17/2020, 01/06/2022   PFIZER(Purple Top)SARS-COV-2 Vaccination 05/04/2019, 05/25/2019, 01/11/2020, 07/17/2020   Pfizer Covid-19 Vaccine Bivalent Booster 50yrs & up 12/22/2020, 11/05/2021   Pneumococcal Conjugate-13 05/26/2015   Pneumococcal Polysaccharide-23 05/15/2009, 11/28/2017   Respiratory Syncytial Virus Vaccine,Recomb Aduvanted(Arexvy) 01/06/2022   Td 01/27/1995   Tdap 01/15/2014   Zoster Recombinant(Shingrix) 04/22/2020, 06/23/2020   Zoster, Live 05/26/2015    TDAP status: Up to date  Flu Vaccine status: Up to date  Pneumococcal vaccine status: Up to date  Covid-19 vaccine status: Information provided on how to obtain vaccines.   Qualifies for Shingles Vaccine? Yes   Zostavax completed Yes   Shingrix Completed?: Yes  Screening Tests Health Maintenance  Topic Date Due   FOOT EXAM  06/23/2020   COVID-19 Vaccine (9 - 2023-24 season) 11/27/2022   HEMOGLOBIN A1C  01/06/2023   OPHTHALMOLOGY EXAM  02/09/2023   Diabetic kidney evaluation - Urine ACR  07/07/2023   Diabetic kidney evaluation - eGFR measurement   09/13/2023   DTaP/Tdap/Td (3 - Td or Tdap) 01/16/2024   Medicare Annual Wellness (AWV)  01/25/2024   Colonoscopy  01/30/2024   Pneumonia Vaccine 43+ Years old  Completed   INFLUENZA VACCINE  Completed   Hepatitis C Screening  Completed   Zoster Vaccines- Shingrix  Completed   HPV VACCINES  Aged Out    Health Maintenance  Health Maintenance Due  Topic Date Due   FOOT EXAM  06/23/2020   COVID-19 Vaccine (9 - 2023-24 season) 11/27/2022   HEMOGLOBIN A1C  01/06/2023    Colorectal cancer screening: Type of screening: Colonoscopy. Completed 01/29/21. Repeat every 3 years  Additional Screening:  Hepatitis C Screening:Completed 05/27/16  Vision Screening: Recommended annual ophthalmology exams for early detection of glaucoma and other disorders of the eye. Is the patient up to date with their annual eye exam?  Yes  Who is the provider or what is the name of the office in which the patient attends annual eye exams? Dr Charlotte Sanes  If pt is not established with a provider, would they like to be referred to a provider to establish care? No .   Dental Screening: Recommended annual dental exams for proper oral hygiene  Diabetic Foot Exam: Diabetic Foot Exam: Overdue, Pt has been advised about the importance in completing this exam. Pt is scheduled for diabetic foot exam on next appt .  Community Resource Referral / Chronic Care Management: CRR required this visit?  No   CCM required this visit?  No     Plan:     I have personally reviewed and noted the following in the patient's chart:   Medical and social history Use of alcohol, tobacco or illicit drugs  Current medications and supplements including opioid prescriptions. Patient is not currently taking opioid prescriptions. Functional ability and status Nutritional status Physical activity Advanced directives List of other physicians Hospitalizations, surgeries, and ER visits in previous 12 months Vitals Screenings to include  cognitive, depression, and falls Referrals and appointments  In addition, I have reviewed and discussed with patient certain preventive protocols, quality metrics, and best practice recommendations. A written personalized care plan for preventive services as well as general preventive health recommendations were provided to patient.     Marzella Schlein, LPN   [id number]   After Visit Summary: (MyChart) Due to this being a telephonic visit, the after visit summary with patients personalized plan was offered to patient via MyChart   Nurse Notes: none

## 2023-01-25 NOTE — Patient Instructions (Signed)
[name] , Thank you for taking time to come for your Medicare Wellness Visit. I appreciate your ongoing commitment to your health goals. Please review the following plan we discussed and let me know if I can assist you in the future.   Referrals/Orders/Follow-Ups/Clinician Recommendations: Aim for 30 minutes of exercise or brisk walking, 6-8 glasses of water, and 5 servings of fruits and vegetables each day.  Each day, aim for 6 glasses of water, plenty of protein in your diet and try to get up and walk/ stretch every hour for 5-10 minutes at a time.    This is a list of the screening recommended for you and due dates:  Health Maintenance  Topic Date Due   Complete foot exam   06/23/2020   COVID-19 Vaccine (9 - 2023-24 season) 11/27/2022   Hemoglobin A1C  01/06/2023   Eye exam for diabetics  02/09/2023   Yearly kidney health urinalysis for diabetes  07/07/2023   Yearly kidney function blood test for diabetes  09/13/2023   DTaP/Tdap/Td vaccine (3 - Td or Tdap) 01/16/2024   Medicare Annual Wellness Visit  01/25/2024   Colon Cancer Screening  01/30/2024   Pneumonia Vaccine  Completed   Flu Shot  Completed   Hepatitis C Screening  Completed   Zoster (Shingles) Vaccine  Completed   HPV Vaccine  Aged Out    Advanced directives: (In Chart) A copy of your advanced directives are scanned into your chart should your provider ever need it.  Next Medicare Annual Wellness Visit scheduled for next year: Yes

## 2023-02-16 DIAGNOSIS — H40013 Open angle with borderline findings, low risk, bilateral: Secondary | ICD-10-CM | POA: Diagnosis not present

## 2023-02-16 DIAGNOSIS — H2513 Age-related nuclear cataract, bilateral: Secondary | ICD-10-CM | POA: Diagnosis not present

## 2023-02-16 DIAGNOSIS — H52203 Unspecified astigmatism, bilateral: Secondary | ICD-10-CM | POA: Diagnosis not present

## 2023-02-16 DIAGNOSIS — H40053 Ocular hypertension, bilateral: Secondary | ICD-10-CM | POA: Diagnosis not present

## 2023-02-16 DIAGNOSIS — E119 Type 2 diabetes mellitus without complications: Secondary | ICD-10-CM | POA: Diagnosis not present

## 2023-02-16 LAB — HM DIABETES EYE EXAM

## 2023-03-10 ENCOUNTER — Other Ambulatory Visit: Payer: Self-pay

## 2023-03-10 DIAGNOSIS — D510 Vitamin B12 deficiency anemia due to intrinsic factor deficiency: Secondary | ICD-10-CM

## 2023-03-10 DIAGNOSIS — D539 Nutritional anemia, unspecified: Secondary | ICD-10-CM

## 2023-03-13 ENCOUNTER — Inpatient Hospital Stay: Payer: Medicare PPO | Attending: Hematology

## 2023-03-13 ENCOUNTER — Inpatient Hospital Stay: Payer: Medicare PPO | Admitting: Hematology

## 2023-03-13 VITALS — BP 165/61 | HR 75 | Temp 97.3°F | Resp 18 | Wt 254.2 lb

## 2023-03-13 DIAGNOSIS — N189 Chronic kidney disease, unspecified: Secondary | ICD-10-CM | POA: Diagnosis not present

## 2023-03-13 DIAGNOSIS — D631 Anemia in chronic kidney disease: Secondary | ICD-10-CM | POA: Insufficient documentation

## 2023-03-13 DIAGNOSIS — E538 Deficiency of other specified B group vitamins: Secondary | ICD-10-CM | POA: Diagnosis not present

## 2023-03-13 DIAGNOSIS — Z87891 Personal history of nicotine dependence: Secondary | ICD-10-CM | POA: Insufficient documentation

## 2023-03-13 DIAGNOSIS — D539 Nutritional anemia, unspecified: Secondary | ICD-10-CM

## 2023-03-13 DIAGNOSIS — D510 Vitamin B12 deficiency anemia due to intrinsic factor deficiency: Secondary | ICD-10-CM

## 2023-03-13 DIAGNOSIS — Z79899 Other long term (current) drug therapy: Secondary | ICD-10-CM | POA: Diagnosis not present

## 2023-03-13 LAB — CMP (CANCER CENTER ONLY)
ALT: 12 U/L (ref 0–44)
AST: 18 U/L (ref 15–41)
Albumin: 3.7 g/dL (ref 3.5–5.0)
Alkaline Phosphatase: 21 U/L — ABNORMAL LOW (ref 38–126)
Anion gap: 6 (ref 5–15)
BUN: 24 mg/dL — ABNORMAL HIGH (ref 8–23)
CO2: 29 mmol/L (ref 22–32)
Calcium: 9.6 mg/dL (ref 8.9–10.3)
Chloride: 104 mmol/L (ref 98–111)
Creatinine: 1.73 mg/dL — ABNORMAL HIGH (ref 0.61–1.24)
GFR, Estimated: 41 mL/min — ABNORMAL LOW (ref >60)
Glucose, Bld: 117 mg/dL — ABNORMAL HIGH (ref 70–99)
Potassium: 3.9 mmol/L (ref 3.5–5.1)
Sodium: 139 mmol/L (ref 135–145)
Total Bilirubin: 0.5 mg/dL (ref <1.2)
Total Protein: 5.9 g/dL — ABNORMAL LOW (ref 6.5–8.1)

## 2023-03-13 LAB — CBC WITH DIFFERENTIAL (CANCER CENTER ONLY)
Abs Immature Granulocytes: 0.01 10*3/uL (ref 0.00–0.07)
Basophils Absolute: 0 10*3/uL (ref 0.0–0.1)
Basophils Relative: 1 %
Eosinophils Absolute: 0.2 10*3/uL (ref 0.0–0.5)
Eosinophils Relative: 4 %
HCT: 24 % — ABNORMAL LOW (ref 39.0–52.0)
Hemoglobin: 8.2 g/dL — ABNORMAL LOW (ref 13.0–17.0)
Immature Granulocytes: 0 %
Lymphocytes Relative: 30 %
Lymphs Abs: 1.5 10*3/uL (ref 0.7–4.0)
MCH: 37.1 pg — ABNORMAL HIGH (ref 26.0–34.0)
MCHC: 34.2 g/dL (ref 30.0–36.0)
MCV: 108.6 fL — ABNORMAL HIGH (ref 80.0–100.0)
Monocytes Absolute: 0.5 10*3/uL (ref 0.1–1.0)
Monocytes Relative: 10 %
Neutro Abs: 2.7 10*3/uL (ref 1.7–7.7)
Neutrophils Relative %: 55 %
Platelet Count: 259 10*3/uL (ref 150–400)
RBC: 2.21 MIL/uL — ABNORMAL LOW (ref 4.22–5.81)
RDW: 13.6 % (ref 11.5–15.5)
WBC Count: 4.9 10*3/uL (ref 4.0–10.5)
nRBC: 0 % (ref 0.0–0.2)

## 2023-03-13 LAB — IRON AND IRON BINDING CAPACITY (CC-WL,HP ONLY)
Iron: 98 ug/dL (ref 45–182)
Saturation Ratios: 33 % (ref 17.9–39.5)
TIBC: 298 ug/dL (ref 250–450)
UIBC: 200 ug/dL (ref 117–376)

## 2023-03-13 LAB — VITAMIN B12: Vitamin B-12: 1604 pg/mL — ABNORMAL HIGH (ref 180–914)

## 2023-03-13 LAB — FERRITIN: Ferritin: 1215 ng/mL — ABNORMAL HIGH (ref 24–336)

## 2023-03-13 NOTE — Progress Notes (Signed)
HEMATOLOGY/ONCOLOGY CLINIC NOTE  Date of Service: 03/13/23     Patient Care Team: Ardith Dark, MD as PCP - General (Family Medicine) Maris Berger, MD as Attending Physician (Ophthalmology) Barron Alvine, MD (Inactive) as Attending Physician (Urology) Elvis Coil, MD as Consulting Physician (Nephrology) Sedalia Muta, PT as Physical Therapist (Physical Therapy)  CHIEF COMPLAINTS/PURPOSE OF CONSULTATION:  Follow-up for multifactorial anemia  HISTORY OF PRESENTING ILLNESS:   Mitchell Alvarado is a wonderful 72 y.o. male who has been referred to [place] by Dr Romero Belling for evaluation and management of anemia. The pt reports that he is doing well overall.   The pt reports that he has been taking monthly Vitamin B12 injections for the last 4-5 years, and his deficiency was first realized through routine blood work. He was taken off Metformin at the time. He has taken Omeprazole for 20 years for his Barrett's esophagus. He denies any food intolerances or thyroid problems. He notes that he has had some fatigue which he attributes to getting older.   He has seen a nephrologist several years ago and was discharged after replacing his Vitamin D satisfactorily and he denies any liver problems as well.   Most recent lab results (05/29/17) of CBC  is as follows: all values are WNL except for WBC at 3.5k, RBC at 3.04, HGB at 11.3, HCT at 32.9, MCV at 108.0.  Review of CBC w/diff over the last two years reveals stability in the above abnormalities.   On review of systems, pt reports some fatigue, stable weight, and denies problems of infection, bleeding, back pains, pain along the spine, abdominal pains, changes in bowel habits, leg selling, urinary discomfort, and any other symptoms.   On PMHx the pt reports neuropathy, prostate surgery in 2000, denies Afib. On Social Hx the pt reports 10-14 glasses of wine each week  and denies any concern for excessive consumption. He denies smoking and  smoked cigarettes for one year a long time ago  On Family Hx the pt reports paternal heart attack.  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  Patient was last seen by me on 09/13/2022 and he complained of chronic fatigue.  Patient notes he has been doing well overall since our last visit. He denies any new infection issues, fever, chills, night sweats, unexpected weight loss, back pain, chest pain, abdominal pain, abnormal bleeding, blood in stool, black stool, hematuria, dizziness, or leg swelling.   He is compliant with all of his medication. He regularly takes Vitamin B-12 supplement 1,000 mcg and B-complex supplement. He denies testosterone medication.   He denies any recent surgeries. Patient did have prostate surgery in 2002.   He is UTD with influenza vaccine and RSV vaccine.   MEDICAL HISTORY:  Past Medical History:  Diagnosis Date   Allergy    ANEMIA, IRON DEFICIENCY 12/23/2009   Barrett's esophagus without dysplasia 05/2012   DIABETES MELLITUS, TYPE II 12/30/2006   ECZEMA 05/15/2009   GERD 12/30/2006   HYPERLIPIDEMIA 12/30/2006   HYPERTENSION 12/30/2006   HYPERTHYROIDISM 06/03/2008   Pernicious anemia 12/30/2006   Personal history of colonic polyps - adenoma 07/22/2004    SURGICAL HISTORY: Past Surgical History:  Procedure Laterality Date   COLONOSCOPY  multiple   COLONOSCOPY WITH PROPOFOL  07/27/2015   Dr.Gessner   ESOPHAGOGASTRODUODENOSCOPY  multiple   POLYPECTOMY     TRANSURETHRAL RESECTION OF PROSTATE  03/28/1998    SOCIAL HISTORY:  Social History   Socioeconomic History   Marital status: Legally Separated    Spouse name: Not on file   Number of children: Not on file   Years of education: Not on file   Highest education level: Not on file  Occupational History   Occupation: Retired from school system  Tobacco Use   Smoking status: Former   Smokeless tobacco: Never   Vaping Use   Vaping status: Never Used  Substance and Sexual Activity   Alcohol use: Yes    Alcohol/week: 7.0 - 10.0 standard drinks of alcohol    Types: 7 - 10 Glasses of wine per week    Comment: 7-10 glasses wine weekly   Drug use: No   Sexual activity: Not on file  Other Topics Concern   Not on file  Social History Narrative   Married   Retired Merchandiser, 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, hospital [employer]   7 drinks/week   Former smoker   No drug use   Social Drivers of Corporate investment banker Strain: Low Risk  (01/20/2023)   Overall Financial Resource Strain (CARDIA)    Difficulty of Paying Living Expenses: Not hard at all  Food Insecurity: No Food Insecurity (01/20/2023)   Hunger Vital Sign    Worried About Running Out of Food in the Last Year: Never true    Ran Out of Food in the Last Year: Never true  Transportation Needs: No Transportation Needs (01/20/2023)   PRAPARE - Administrator, Civil Service (Medical): No    Lack of Transportation (Non-Medical): No  Physical Activity: Inactive (01/20/2023)   Exercise Vital Sign    Days of Exercise per Week: 0 days    Minutes of Exercise per Session: 0 min  Stress: No Stress Concern Present (01/20/2023)   [employer] of Occupational Health - Occupational Stress Questionnaire    Feeling of Stress : Only a little  Social Connections: Socially Isolated (01/20/2023)   Social Connection and Isolation Panel [NHANES]    Frequency of Communication with Friends and Family: Once a week    Frequency of Social Gatherings with Friends and Family: Never    Attends Religious Services: Never    Database administrator or Organizations: Yes    Attends Banker Meetings: Never    Marital Status: Separated  Intimate Partner Violence: Not At Risk (01/25/2023)   Humiliation, Afraid, Rape, and Kick questionnaire    Fear of Current or Ex-Partner: No    Emotionally Abused: No     Physically Abused: No    Sexually Abused: No    FAMILY HISTORY: Family History  Problem Relation Age of Onset   Heart attack Father        MI   Depression Son    COPD Son    Cancer Neg Hx    Colon cancer Neg Hx    Esophageal cancer Neg Hx    Stomach cancer Neg Hx     ALLERGIES:  has no known allergies.  MEDICATIONS:  Current Outpatient Medications  Medication Sig Dispense Refill   aspirin 81 MG tablet Take 1 tablet (81 mg total) by mouth daily. 90 tablet 3   calcitRIOL (ROCALTROL) 0.25 MCG capsule TAKE 1 CAPSULE (0.25 MCG TOTAL) BY MOUTH DAILY. TAKE MONDAY WEDNESDAY AND FRIDAY. 36 capsule 0   carvedilol (COREG) 3.125 MG tablet PLEASE SEE ATTACHED FOR DETAILED DIRECTIONS 180 tablet 1   Cholecalciferol (VITAMIN D) 2000 units CAPS Take 2,000  Units by mouth daily.     ezetimibe-simvastatin (VYTORIN) 10-80 MG tablet TAKE 1 TABLET BY MOUTH EVERY DAY 90 tablet 1   fenofibrate (TRICOR) 145 MG tablet TAKE 1 TABLET BY MOUTH EVERY DAY 90 tablet 2   glucose blood (ONETOUCH VERIO) test strip 1 each by Other route daily. And lancets 1/day 100 each 3   hydrochlorothiazide (HYDRODIURIL) 25 MG tablet TAKE 1 TABLET BY MOUTH EVERY DAY 90 tablet 2   latanoprost (XALATAN) 0.005 % ophthalmic solution Place 1 drop into both eyes at bedtime.     omeprazole (PRILOSEC) 20 MG capsule TAKE 1 CAPSULE BY MOUTH EVERY DAY 90 capsule 3   SPIKEVAX 50 MCG/0.5ML SUSY Inject into the muscle. (Patient not taking: Reported on 07/07/2022)     vitamin B-12 (CYANOCOBALAMIN) 1000 MCG tablet Take 1,000 mcg by mouth daily.     No current facility-administered medications for this visit.    REVIEW OF SYSTEMS:   10 Point review of Systems was done is negative except as noted above.  PHYSICAL EXAMINATION:  . Vitals:   03/13/23 1355  BP: (!) 165/61  Pulse: 75  Resp: 18  Temp: (!) 97.3 F (36.3 C)  SpO2: 100%   Filed Weights   03/13/23 1355  Weight: 254 lb 3.2 oz (115.3 kg)   .Body mass index is 32.64 kg/m.   Marland Kitchen GENERAL:alert, in no acute distress and comfortable SKIN: no acute rashes, no significant lesions EYES: conjunctiva are pink and non-injected, sclera anicteric OROPHARYNX: MMM, no exudates, no oropharyngeal erythema or ulceration NECK: supple, no JVD LYMPH:  no palpable lymphadenopathy in the cervical, axillary or inguinal regions LUNGS: clear to auscultation b/l with normal respiratory effort HEART: regular rate & rhythm ABDOMEN:  normoactive bowel sounds , non tender, not distended. Extremity: no pedal edema PSYCH: alert & oriented x 3 with fluent speech NEURO: no focal motor/sensory deficits   LABORATORY DATA:  I have reviewed the data as listed .    Latest Ref Rng & Units 03/13/2023    1:16 PM 09/13/2022    1:06 PM 07/07/2022   11:37 AM  CBC  WBC 4.0 - 10.5 K/uL 4.9  5.4  4.2   Hemoglobin 13.0 - 17.0 g/dL 8.2  9.1  9.8   Hematocrit 39.0 - 52.0 % 24.0  26.4  28.6   Platelets 150 - 400 K/uL 259  215  224.0    .CBC    Component Value Date/Time   WBC 4.9 03/13/2023 1316   WBC 4.2 07/07/2022 1137   RBC 2.21 (L) 03/13/2023 1316   HGB 8.2 (L) 03/13/2023 1316   HCT 24.0 (L) 03/13/2023 1316   HCT 31.1 (L) 09/07/2017 1405   PLT 259 03/13/2023 1316   MCV 108.6 (H) 03/13/2023 1316   MCH 37.1 (H) 03/13/2023 1316   MCHC 34.2 03/13/2023 1316   RDW 13.6 03/13/2023 1316   LYMPHSABS 1.5 03/13/2023 1316   MONOABS 0.5 03/13/2023 1316   EOSABS 0.2 03/13/2023 1316   BASOSABS 0.0 03/13/2023 1316    .    Latest Ref Rng & Units 03/13/2023    1:16 PM 09/13/2022    1:06 PM 07/07/2022   11:37 AM  CMP  Glucose 70 - 99 mg/dL 409  811  914   BUN 8 - 23 mg/dL 24  26  25    Creatinine 0.61 - 1.24 mg/dL 7.82  9.56  2.13   Sodium 135 - 145 mmol/L 139  137  139   Potassium 3.5 - 5.1 mmol/L  3.9  4.7  5.2 No hemolysis seen   Chloride 98 - 111 mmol/L 104  105  103   CO2 22 - 32 mmol/L 29  24  27    Calcium 8.9 - 10.3 mg/dL 9.6  9.9  9.9    9.9   Total Protein 6.5 - 8.1 g/dL 5.9  6.2  6.3    Total Bilirubin <1.2 mg/dL 0.5  0.5  0.5   Alkaline Phos 38 - 126 U/L 21  19  24    AST 15 - 41 U/L 18  14  20    ALT 0 - 44 U/L 12  11  14     . Lab Results  Component Value Date   IRON 98 03/13/2023   TIBC 298 03/13/2023   IRONPCTSAT 33 03/13/2023   (Iron and TIBC)  Lab Results  Component Value Date   FERRITIN 1,215 (H) 03/13/2023     Component     Latest Ref Rng & Units 09/07/2017  IgG (Immunoglobin G), Serum     700 - 1,600 mg/dL 161  IgA     61 - 096 mg/dL 045  IgM (Immunoglobulin M), Srm     20 - 172 mg/dL 409  Total Protein ELP     6.0 - 8.5 g/dL 6.7  Albumin SerPl Elph-Mcnc     2.9 - 4.4 g/dL 4.0  Alpha 1     0.0 - 0.4 g/dL 0.2  Alpha2 Glob SerPl Elph-Mcnc     0.4 - 1.0 g/dL 0.8  B-Globulin SerPl Elph-Mcnc     0.7 - 1.3 g/dL 1.1  Gamma Glob SerPl Elph-Mcnc     0.4 - 1.8 g/dL 0.6  M Protein SerPl Elph-Mcnc     Not Observed g/dL Not Observed  Globulin, Total     2.2 - 3.9 g/dL 2.7  Albumin/Glob SerPl     0.7 - 1.7 1.5  IFE 1      Comment  Please Note (HCV):      Comment  Folate, Hemolysate     Not Estab. ng/mL 204.0  HCT     37.5 - 51.0 % 31.1 (L)  Folate, RBC     >498 ng/mL 656  Vitamin B12     180 - 914 pg/mL 6,847 (H)  TSH     0.320 - 4.118 uIU/mL 1.016  Ferritin     22 - 316 ng/mL 867 (H)  Copper     72 - 166 ug/dL 97  Parietal Cell Antibody-IgG     0.0 - 20.0 Units 10.9  Intrinsic Factor     0.0 - 1.1 AU/mL 11.8 (H)    RADIOGRAPHIC STUDIES: I have personally reviewed the radiological images as listed and agreed with the findings in the report. No results found.  ASSESSMENT & PLAN:   72 y.o. male with  1. Macrocytic Anemia - multifactorial -- pernicious anemia with anti IF antibodies + Anemia from CKD + cannot r/o an element of low grade MDS ? ? ETOH use  , Low testosterone levels Review of patient's CBCs w/diff over the last two years reveals stability on the above abnormalities  Patient appears to have pernicious anemia with  anti intrinsic factor antibodies. Currently no B12 or folate deficiency deficiency.but could have other B vit deficiencies. No monoclonal paraproteinemia. Concern for ETOH use Has some CKD that could be contributing to the anemia as well. No known liver disease No overt bleeding noted.  2. B12 deficiency -multifactorial -- pernicious anemia + previous metformin use +  current PPI use.  3.  Elevated ferritin levels Patient previously had Barrett's esophagus and iron deficiency anemia.  Currently his ferritin is remained elevated likely in the context of significant previous iron replacement. -Hold off on any iron supplementation. HFE gene mutation study is negative for any genetic findings suggestive of hemochromatosis.  PLAN: -Discussed lab results from today, 03/13/2023, in detail with the patient. CBC shows patient is anemic with hemoglobin of 8.2 g/dL with hematocrit of 16.1%. CMP shows elevated BUN of 24, elevated creatinine of 1.73, and slightly decreased Alkaline phosphate of 21. Iron labs and Vitamin B-12 level are pending.  -Recommend COVID-19 Booster and other age related vaccines.  -Discussed that since patient is anemic with hgb below 9. 0 g/dL and if iron levels and Vitamin B-12 levels are normal, then patient would need bone marrow biopsy and could consider erythropoietin injection. -Next option is: 1. Bone marrow biopsy beginning of next year. 2. No bone marrow biopsy and start Erythropoietin injection. - if iron level and Vitamin B-12 levels are in the normal range.  -Patient agrees to get a bone marrow biopsy in January, if labs from today show normal range.  -Discussed the option of testosterone replacement. Patient will consider this option and evaluate this with his PCP. -Answered all of patient's questions.   -Continue Vitamin B-12 and B-complex supplement. Recommend sublingual vitamin B-12 supplement and B-complex liquid version.   FOLLOW UP: CT bone marrow aspiration  and biopsy in 2nd week of Jan 2025 RTC with Dr Candise Che with labs in 3rd week of January 2025 F/u with PCP for consideration of testosterone replacement  The total time spent in the appointment was 30 minutes* .  All of the patient's questions were answered with apparent satisfaction. The patient knows to call the clinic with any problems, questions or concerns.   Wyvonnia Lora MD MS AAHIVMS [facility] Hematology/Oncology Physician [facility]  .*Total Encounter Time as defined by the Centers for Medicare and Medicaid Services includes, in addition to the face-to-face time of a patient visit (documented in the note above) non-face-to-face time: obtaining and reviewing outside history, ordering and reviewing medications, tests or procedures, care coordination (communications with other health care professionals or caregivers) and documentation in the medical record.   I,Param Shah,acting as a Neurosurgeon for Wyvonnia Lora, MD.,have documented all relevant documentation on the behalf of Wyvonnia Lora, MD,as directed by  Wyvonnia Lora, MD while in the presence of Wyvonnia Lora, MD.   .I have reviewed the above documentation for accuracy and completeness, and I agree with the above. Johney Maine MD

## 2023-03-14 ENCOUNTER — Telehealth: Payer: Self-pay | Admitting: Hematology

## 2023-03-14 NOTE — Telephone Encounter (Signed)
Spoke with patient confirming upcoming appointment  

## 2023-04-06 ENCOUNTER — Other Ambulatory Visit: Payer: Self-pay | Admitting: Radiology

## 2023-04-06 DIAGNOSIS — D539 Nutritional anemia, unspecified: Secondary | ICD-10-CM

## 2023-04-06 NOTE — H&P (Signed)
 Referring Physician(s): Onesimo Emaline Brink  Supervising Physician: Luverne Aran  Patient Status:  [facility]  Chief Complaint: Progressive macrocytic anemia    HPI: [name] is a 73 yo male with PMH sig for Barrett's esophagus, DM, GERD, HLD, HTN, hyperthyroidism, eczema and progressive macrocytic anemia (multifactorial ), pernicious anemia with anti IF antibodies/anemia from CKD ,? low grade MDS, low testosterone  levels. He is scheduled today for CT guided bone marrow biopsy for further evaluation to rule out MDS vs other infiltrative BM disorder.   Past Medical History:  Diagnosis Date   Allergy    ANEMIA, IRON DEFICIENCY 12/23/2009   Barrett's esophagus without dysplasia 05/2012   DIABETES MELLITUS, TYPE II 12/30/2006   ECZEMA 05/15/2009   GERD 12/30/2006   HYPERLIPIDEMIA 12/30/2006   HYPERTENSION 12/30/2006   HYPERTHYROIDISM 06/03/2008   Pernicious anemia 12/30/2006   Personal history of colonic polyps - adenoma 07/22/2004   Past Surgical History:  Procedure Laterality Date   COLONOSCOPY  multiple   COLONOSCOPY WITH PROPOFOL   07/27/2015   Dr.Gessner   ESOPHAGOGASTRODUODENOSCOPY  multiple   POLYPECTOMY     TRANSURETHRAL RESECTION OF PROSTATE  03/28/1998   Social History   Socioeconomic History   Marital status: Legally Separated    Spouse name: Not on file   Number of children: Not on file   Years of education: Not on file   Highest education level: Not on file  Occupational History   Occupation: Retired from school system  Tobacco Use   Smoking status: Former   Smokeless tobacco: Never  Vaping Use   Vaping status: Never Used  Substance and Sexual Activity   Alcohol use: Yes    Alcohol/week: 7.0 - 10.0 standard drinks of alcohol    Types: 7 - 10 Glasses of wine per week    Comment: 7-10 glasses wine weekly   Drug use: No   Sexual activity: Not on file  Other Topics Concern   Not on file  Social History Narrative   Married   Retired psychologist, 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, hospital [employer]   7 drinks/week   Former smoker   No drug use   Social Drivers of Corporate Investment Banker Strain: Low Risk  (01/20/2023)   Overall Financial Resource Strain (CARDIA)    Difficulty of Paying Living Expenses: Not hard at all  Food Insecurity: No Food Insecurity (01/20/2023)   Hunger Vital Sign    Worried About Running Out of Food in the Last Year: Never true    Ran Out of Food in the Last Year: Never true  Transportation Needs: No Transportation Needs (01/20/2023)   PRAPARE - Administrator, Civil Service (Medical): No    Lack of Transportation (Non-Medical): No  Physical Activity: Inactive (01/20/2023)   Exercise Vital Sign    Days of Exercise per Week: 0 days    Minutes of Exercise per Session: 0 min  Stress: No Stress Concern Present (01/20/2023)   [employer] of Occupational Health - Occupational Stress Questionnaire    Feeling of Stress : Only a little  Social Connections: Socially Isolated (01/20/2023)   Social Connection and Isolation Panel [NHANES]    Frequency of Communication with Friends and Family: Once a week    Frequency of Social Gatherings with Friends and Family: Never    Attends Religious Services: Never    Database Administrator or Organizations: Yes    Attends Banker Meetings: Never  Marital Status: Separated  Intimate Partner Violence: Not At Risk (01/25/2023)   Humiliation, Afraid, Rape, and Kick questionnaire    Fear of Current or Ex-Partner: No    Emotionally Abused: No    Physically Abused: No    Sexually Abused: No   Family History  Problem Relation Age of Onset   Heart attack Father        MI   Depression Son    COPD Son    Cancer Neg Hx    Colon cancer Neg Hx    Esophageal cancer Neg Hx    Stomach cancer Neg Hx     Review of Systems : denies , fever, HA,CP,dyspnea, cough, abd /back pain,N/V or bleeding     Allergies: Patient  has no known allergies.  Medications: Prior to Admission medications   Medication Sig Start Date End Date Taking? Authorizing Provider  aspirin  81 MG tablet Take 1 tablet (81 mg total) by mouth daily. 05/22/13   Kassie Mallick, MD  calcitRIOL  (ROCALTROL ) 0.25 MCG capsule TAKE 1 CAPSULE (0.25 MCG TOTAL) BY MOUTH DAILY. TAKE MONDAY WEDNESDAY AND FRIDAY. 12/26/22   Kennyth Worth HERO, MD  carvedilol  (COREG ) 3.125 MG tablet PLEASE SEE ATTACHED FOR DETAILED DIRECTIONS 01/23/23   Kennyth Worth HERO, MD  Cholecalciferol (VITAMIN D ) 2000 units CAPS Take 2,000 Units by mouth daily.    [provider]  ezetimibe -simvastatin  (VYTORIN ) 10-80 MG tablet TAKE 1 TABLET BY MOUTH EVERY DAY 10/25/22   Kennyth Worth HERO, MD  fenofibrate  (TRICOR ) 145 MG tablet TAKE 1 TABLET BY MOUTH EVERY DAY 07/25/22   Kennyth Worth HERO, MD  glucose blood (ONETOUCH VERIO) test strip 1 each by Other route daily. And lancets 1/day 05/29/17   Kassie Mallick, MD  hydrochlorothiazide  (HYDRODIURIL ) 25 MG tablet TAKE 1 TABLET BY MOUTH EVERY DAY 07/25/22   Kennyth Worth HERO, MD  latanoprost (XALATAN) 0.005 % ophthalmic solution Place 1 drop into both eyes at bedtime.    [provider]  omeprazole  (PRILOSEC ) 20 MG capsule TAKE 1 CAPSULE BY MOUTH EVERY DAY 07/25/22   Kennyth Worth HERO, MD  SPIKEVAX 50 MCG/0.5ML SUSY Inject into the muscle. Patient not taking: Reported on 07/07/2022 01/06/22   [provider]  vitamin B-12 (CYANOCOBALAMIN ) 1000 MCG tablet Take 1,000 mcg by mouth daily.    [provider]     Vital Signs: TEMP 98.1  R 16  BP pend    Code Status: DNR   Physical Exam: awake/alert; chest- CTA bilat; heart- RRR; abd-soft,+BS,NT; no sig LE edema  Imaging: No results found.  Labs:  CBC: Recent Labs    07/07/22 1137 09/13/22 1306 03/13/23 1316  WBC 4.2 5.4 4.9  HGB 9.8* 9.1* 8.2*  HCT 28.6* 26.4* 24.0*  PLT 224.0 215 259    COAGS: No results for input(s): INR, APTT in the last 8760  hours.  BMP: Recent Labs    07/07/22 1137 09/13/22 1306 03/13/23 1316  NA 139 137 139  K 5.2 No hemolysis seen* 4.7 3.9  CL 103 105 104  CO2 27 24 29   GLUCOSE 131* 145* 117*  BUN 25* 26* 24*  CALCIUM 9.9  9.9 9.9 9.6  CREATININE 1.87* 2.13* 1.73*  GFRNONAA  --  32* 41*    LIVER FUNCTION TESTS: Recent Labs    07/07/22 1137 09/13/22 1306 03/13/23 1316  BILITOT 0.5 0.5 0.5  AST 20 14* 18  ALT 14 11 12   ALKPHOS 24* 19* 21*  PROT 6.3 6.2* 5.9*  ALBUMIN 4.0  3.8 3.7    Assessment and Plan: 73 yo male with PMH sig for Barrett's esophagus, DM, GERD, HLD, HTN, hyperthyroidism, eczema and progressive macrocytic anemia (multifactorial ), pernicious anemia with anti IF antibodies/anemia from CKD ,? low grade MDS, low testosterone  levels. He is scheduled today for CT guided bone marrow biopsy for further evaluation to rule out MDS vs other infiltrative BM disorder.Risks and benefits of procedure was discussed with the patient  including, but not limited to bleeding, infection, damage to adjacent structures or low yield requiring additional tests.  All of the questions were answered and there is agreement to proceed.  Consent signed and in chart.    Electronically Signed: D. Franky Rakers, PA-C 04/06/2023, 3:10 PM   I spent a total of 20 minutes at the the patient's bedside AND on the patient's hospital floor or unit, greater than 50% of which was counseling/coordinating care for CT guided bone marrow biopsy

## 2023-04-07 ENCOUNTER — Ambulatory Visit (HOSPITAL_COMMUNITY)
Admission: RE | Admit: 2023-04-07 | Discharge: 2023-04-07 | Disposition: A | Discharge status: Home / Self Care | Payer: Medicare PPO | Source: Ambulatory Visit | Attending: Hematology | Admitting: Hematology

## 2023-04-07 ENCOUNTER — Ambulatory Visit (HOSPITAL_COMMUNITY)
Admission: RE | Admit: 2023-04-07 | Discharge: 2023-04-07 | Disposition: A | Discharge status: Home / Self Care | Payer: Medicare PPO | Source: Ambulatory Visit | Attending: Family Medicine | Admitting: Family Medicine

## 2023-04-07 ENCOUNTER — Encounter (HOSPITAL_COMMUNITY): Payer: Self-pay

## 2023-04-07 ENCOUNTER — Other Ambulatory Visit: Payer: Self-pay

## 2023-04-07 DIAGNOSIS — E785 Hyperlipidemia, unspecified: Secondary | ICD-10-CM | POA: Diagnosis not present

## 2023-04-07 DIAGNOSIS — I129 Hypertensive chronic kidney disease with stage 1 through stage 4 chronic kidney disease, or unspecified chronic kidney disease: Secondary | ICD-10-CM | POA: Insufficient documentation

## 2023-04-07 DIAGNOSIS — K219 Gastro-esophageal reflux disease without esophagitis: Secondary | ICD-10-CM | POA: Insufficient documentation

## 2023-04-07 DIAGNOSIS — D510 Vitamin B12 deficiency anemia due to intrinsic factor deficiency: Secondary | ICD-10-CM | POA: Insufficient documentation

## 2023-04-07 DIAGNOSIS — D539 Nutritional anemia, unspecified: Secondary | ICD-10-CM | POA: Insufficient documentation

## 2023-04-07 DIAGNOSIS — D631 Anemia in chronic kidney disease: Secondary | ICD-10-CM | POA: Diagnosis not present

## 2023-04-07 DIAGNOSIS — E1122 Type 2 diabetes mellitus with diabetic chronic kidney disease: Secondary | ICD-10-CM | POA: Diagnosis not present

## 2023-04-07 DIAGNOSIS — E0590 Thyrotoxicosis, unspecified without thyrotoxic crisis or storm: Secondary | ICD-10-CM | POA: Diagnosis not present

## 2023-04-07 DIAGNOSIS — N189 Chronic kidney disease, unspecified: Secondary | ICD-10-CM | POA: Insufficient documentation

## 2023-04-07 DIAGNOSIS — Z66 Do not resuscitate: Secondary | ICD-10-CM | POA: Insufficient documentation

## 2023-04-07 LAB — CBC WITH DIFFERENTIAL/PLATELET
Abs Immature Granulocytes: 0.03 10*3/uL (ref 0.00–0.07)
Basophils Absolute: 0 10*3/uL (ref 0.0–0.1)
Basophils Relative: 1 %
Eosinophils Absolute: 0.2 10*3/uL (ref 0.0–0.5)
Eosinophils Relative: 4 %
HCT: 24.2 % — ABNORMAL LOW (ref 39.0–52.0)
Hemoglobin: 8.2 g/dL — ABNORMAL LOW (ref 13.0–17.0)
Immature Granulocytes: 1 %
Lymphocytes Relative: 51 %
Lymphs Abs: 2.3 10*3/uL (ref 0.7–4.0)
MCH: 37.6 pg — ABNORMAL HIGH (ref 26.0–34.0)
MCHC: 33.9 g/dL (ref 30.0–36.0)
MCV: 111 fL — ABNORMAL HIGH (ref 80.0–100.0)
Monocytes Absolute: 0.4 10*3/uL (ref 0.1–1.0)
Monocytes Relative: 9 %
Neutro Abs: 1.5 10*3/uL — ABNORMAL LOW (ref 1.7–7.7)
Neutrophils Relative %: 34 %
Platelets: 228 10*3/uL (ref 150–400)
RBC: 2.18 MIL/uL — ABNORMAL LOW (ref 4.22–5.81)
RDW: 13.9 % (ref 11.5–15.5)
WBC: 4.5 10*3/uL (ref 4.0–10.5)
nRBC: 0 % (ref 0.0–0.2)

## 2023-04-07 LAB — GLUCOSE, CAPILLARY: Glucose-Capillary: 109 mg/dL — ABNORMAL HIGH (ref 70–99)

## 2023-04-07 MED ORDER — SODIUM CHLORIDE 0.9% FLUSH: 3.0000 mL | INTRAVENOUS | Status: DC | PRN

## 2023-04-07 MED ORDER — FENTANYL CITRATE (PF) 100 MCG/2ML IJ SOLN
INTRAMUSCULAR | Status: AC
  Filled 2023-04-07: qty 2

## 2023-04-07 MED ORDER — FENTANYL CITRATE (PF) 100 MCG/2ML IJ SOLN
INTRAMUSCULAR | Status: AC | PRN
  Administered 2023-04-07 (×2): 50 ug via INTRAVENOUS

## 2023-04-07 MED ORDER — SODIUM CHLORIDE 0.9 % IV SOLN: INTRAVENOUS | Status: DC

## 2023-04-07 MED ORDER — MIDAZOLAM HCL 2 MG/2ML IJ SOLN
INTRAMUSCULAR | Status: AC | PRN
  Administered 2023-04-07: 1 mg via INTRAVENOUS
  Administered 2023-04-07: 2 mg via INTRAVENOUS
  Administered 2023-04-07: 1 mg via INTRAVENOUS

## 2023-04-07 MED ORDER — MIDAZOLAM HCL 2 MG/2ML IJ SOLN
INTRAMUSCULAR | Status: AC
  Filled 2023-04-07: qty 4

## 2023-04-07 MED ORDER — SODIUM CHLORIDE 0.9% FLUSH: 3.0000 mL | Freq: Two times a day (BID) | INTRAVENOUS | Status: DC

## 2023-04-07 NOTE — Procedures (Signed)
 Interventional Radiology Procedure Note  Procedure: CT guided bone marrow aspiration and biopsy  Complications: None  EBL: < 10 mL  Findings: Aspirate and core biopsy performed of bone marrow in right iliac bone.  Plan: Bedrest supine x 1 hrs  Ericha Whittingham T. Fredia Sorrow, M.D Pager:  [phone]

## 2023-04-07 NOTE — Discharge Instructions (Signed)
 Moderate Conscious Sedation-Care After  This sheet gives you information about how to care for yourself after your procedure. Your health care provider may also give you more specific instructions. If you have problems or questions, contact your health care provider.  After the procedure, it is common to have: Sleepiness for several hours. Impaired judgment for several hours. Difficulty with balance. Vomiting if you eat too soon.  Follow these instructions at home:  Rest. Do not participate in activities where you could fall or become injured. Do not drive or use machinery. Do not drink alcohol. Do not take sleeping pills or medicines that cause drowsiness. Do not make important decisions or sign legal documents. Do not take care of children on your own.  Eating and drinking Follow the diet recommended by your health care provider. Drink enough fluid to keep your urine pale yellow. If you vomit: Drink water, juice, or soup when you can drink without vomiting. Make sure you have little or no nausea before eating solid foods.  General instructions Take over-the-counter and prescription medicines only as told by your health care provider. Have a responsible adult stay with you for the time you are told. It is important to have someone help care for you until you are awake and alert. Do not smoke. Keep all follow-up visits as told by your health care provider. This is important.  Contact a health care provider if: You are still sleepy or having trouble with balance after 24 hours. You feel light-headed. You keep feeling nauseous or you keep vomiting. You develop a rash. You have a fever. You have redness or swelling around the IV site.  Get help right away if: You have trouble breathing. You have new-onset confusion at home.  This information is not intended to replace advice given to you by your health care provider. Make sure you discuss any questions you have with your  healthcare provider.  Please call Interventional Radiology clinic [phone] with any questions or concerns.  You may remove your dressing and shower tomorrow.  After the procedure, it is common to have: Soreness Bruising Mild pain  Follow these instructions at home:  Medication: Do not use Aspirin or ibuprofen products, such as Advil or Motrin, as it may increase bleeding You may resume your usual medications as ordered by your doctor If your doctor prescribed antibiotics, take them as directed. Do not stop taking them just because you feel better. You need to take the full course of antibiotics  Eating and drinking: Drink plenty of liquids to keep your urine pale yellow You can resume your regular diet as directed by your doctor   Care of the procedure site  Check your biopsy site every day until it heals  Keep the bandage dry. You can take the bandage off and shower tomorrow  If you are bleeding from the biopsy site, apply firm pressure on the area for at least 30 minutes  If directed, apply ice to your biopsy site 2-3 times a day.  o Put ice in a plastic bag  o Place a towel between your skin and the ice bag  o Leave the ice in place for 20 minutes 2-3 times a day  Activity Do not take baths, swim, or use a hot tub until your health care provider approves  Keep all follow-up visits as told by your doctor  Contact your doctor or seek immediate medical care if: You have bright red bleeding from the biopsy site that does not  stop after 30 minutes of holding direct pressure on the site. You have signs of infection, such as: Increased pain, swelling, warmth, or redness at the biopsy site Red streaks leading from the biopsy site Yellow or green drainage from the biopsy site A fever (temperature over 100.55F) chills, or both

## 2023-04-11 LAB — SURGICAL PATHOLOGY

## 2023-04-17 ENCOUNTER — Encounter (HOSPITAL_COMMUNITY): Payer: Self-pay | Admitting: Hematology

## 2023-04-17 ENCOUNTER — Other Ambulatory Visit: Payer: Self-pay

## 2023-04-17 DIAGNOSIS — D539 Nutritional anemia, unspecified: Secondary | ICD-10-CM

## 2023-04-17 DIAGNOSIS — D510 Vitamin B12 deficiency anemia due to intrinsic factor deficiency: Secondary | ICD-10-CM

## 2023-04-18 ENCOUNTER — Inpatient Hospital Stay: Payer: Medicare PPO | Attending: Hematology | Admitting: Hematology

## 2023-04-18 ENCOUNTER — Inpatient Hospital Stay: Payer: Medicare PPO | Attending: Hematology

## 2023-04-18 VITALS — BP 186/81 | HR 88 | Temp 97.2°F | Resp 16 | Wt 247.0 lb

## 2023-04-18 DIAGNOSIS — D631 Anemia in chronic kidney disease: Secondary | ICD-10-CM | POA: Diagnosis not present

## 2023-04-18 DIAGNOSIS — N189 Chronic kidney disease, unspecified: Secondary | ICD-10-CM | POA: Diagnosis not present

## 2023-04-18 DIAGNOSIS — D539 Nutritional anemia, unspecified: Secondary | ICD-10-CM

## 2023-04-18 DIAGNOSIS — E538 Deficiency of other specified B group vitamins: Secondary | ICD-10-CM | POA: Diagnosis not present

## 2023-04-18 DIAGNOSIS — D469 Myelodysplastic syndrome, unspecified: Secondary | ICD-10-CM | POA: Insufficient documentation

## 2023-04-18 DIAGNOSIS — Z79899 Other long term (current) drug therapy: Secondary | ICD-10-CM | POA: Insufficient documentation

## 2023-04-18 DIAGNOSIS — D510 Vitamin B12 deficiency anemia due to intrinsic factor deficiency: Secondary | ICD-10-CM

## 2023-04-18 DIAGNOSIS — Z87891 Personal history of nicotine dependence: Secondary | ICD-10-CM | POA: Diagnosis not present

## 2023-04-18 DIAGNOSIS — R7989 Other specified abnormal findings of blood chemistry: Secondary | ICD-10-CM | POA: Diagnosis not present

## 2023-04-18 LAB — CMP (CANCER CENTER ONLY)
ALT: 12 U/L (ref 0–44)
AST: 21 U/L (ref 15–41)
Albumin: 3.9 g/dL (ref 3.5–5.0)
Alkaline Phosphatase: 23 U/L — ABNORMAL LOW (ref 38–126)
Anion gap: 10 (ref 5–15)
BUN: 21 mg/dL (ref 8–23)
CO2: 26 mmol/L (ref 22–32)
Calcium: 9.8 mg/dL (ref 8.9–10.3)
Chloride: 103 mmol/L (ref 98–111)
Creatinine: 1.95 mg/dL — ABNORMAL HIGH (ref 0.61–1.24)
GFR, Estimated: 36 mL/min — ABNORMAL LOW (ref >60)
Glucose, Bld: 118 mg/dL — ABNORMAL HIGH (ref 70–99)
Potassium: 4.1 mmol/L (ref 3.5–5.1)
Sodium: 139 mmol/L (ref 135–145)
Total Bilirubin: 0.7 mg/dL (ref 0.0–1.2)
Total Protein: 6.5 g/dL (ref 6.5–8.1)

## 2023-04-18 LAB — CBC WITH DIFFERENTIAL (CANCER CENTER ONLY)
Abs Immature Granulocytes: 0.01 10*3/uL (ref 0.00–0.07)
Basophils Absolute: 0.1 10*3/uL (ref 0.0–0.1)
Basophils Relative: 1 %
Eosinophils Absolute: 0.2 10*3/uL (ref 0.0–0.5)
Eosinophils Relative: 3 %
HCT: 24.7 % — ABNORMAL LOW (ref 39.0–52.0)
Hemoglobin: 8.7 g/dL — ABNORMAL LOW (ref 13.0–17.0)
Immature Granulocytes: 0 %
Lymphocytes Relative: 38 %
Lymphs Abs: 1.7 10*3/uL (ref 0.7–4.0)
MCH: 37.8 pg — ABNORMAL HIGH (ref 26.0–34.0)
MCHC: 35.2 g/dL (ref 30.0–36.0)
MCV: 107.4 fL — ABNORMAL HIGH (ref 80.0–100.0)
Monocytes Absolute: 0.4 10*3/uL (ref 0.1–1.0)
Monocytes Relative: 10 %
Neutro Abs: 2.2 10*3/uL (ref 1.7–7.7)
Neutrophils Relative %: 48 %
Platelet Count: 233 10*3/uL (ref 150–400)
RBC: 2.3 MIL/uL — ABNORMAL LOW (ref 4.22–5.81)
RDW: 13.2 % (ref 11.5–15.5)
WBC Count: 4.6 10*3/uL (ref 4.0–10.5)
nRBC: 0 % (ref 0.0–0.2)

## 2023-04-18 LAB — VITAMIN B12: Vitamin B-12: 1340 pg/mL — ABNORMAL HIGH (ref 180–914)

## 2023-04-18 LAB — FERRITIN: Ferritin: 1042 ng/mL — ABNORMAL HIGH (ref 24–336)

## 2023-04-18 LAB — IRON AND IRON BINDING CAPACITY (CC-WL,HP ONLY)
Iron: 119 ug/dL (ref 45–182)
Saturation Ratios: 40 % — ABNORMAL HIGH (ref 17.9–39.5)
TIBC: 300 ug/dL (ref 250–450)
UIBC: 181 ug/dL (ref 117–376)

## 2023-04-18 NOTE — Progress Notes (Signed)
HEMATOLOGY/ONCOLOGY CLINIC NOTE  Date of Service: 04/18/23     Patient Care Team: Ardith Dark, MD as PCP - General (Family Medicine) Maris Berger, MD as Attending Physician (Ophthalmology) Barron Alvine, MD (Inactive) as Attending Physician (Urology) Elvis Coil, MD as Consulting Physician (Nephrology) Sedalia Muta, PT as Physical Therapist (Physical Therapy)  CHIEF COMPLAINTS/PURPOSE OF CONSULTATION:  Follow-up for multifactorial anemia  HISTORY OF PRESENTING ILLNESS:   Mitchell Alvarado is a wonderful 73 y.o. male who has been referred to [place] by Dr Romero Belling for evaluation and management of anemia. The pt reports that he is doing well overall.   The pt reports that he has been taking monthly Vitamin B12 injections for the last 4-5 years, and his deficiency was first realized through routine blood work. He was taken off Metformin at the time. He has taken Omeprazole for 20 years for his Barrett's esophagus. He denies any food intolerances or thyroid problems. He notes that he has had some fatigue which he attributes to getting older.   He has seen a nephrologist several years ago and was discharged after replacing his Vitamin D satisfactorily and he denies any liver problems as well.   Most recent lab results (05/29/17) of CBC  is as follows: all values are WNL except for WBC at 3.5k, RBC at 3.04, HGB at 11.3, HCT at 32.9, MCV at 108.0.  Review of CBC w/diff over the last two years reveals stability in the above abnormalities.   On review of systems, pt reports some fatigue, stable weight, and denies problems of infection, bleeding, back pains, pain along the spine, abdominal pains, changes in bowel habits, leg selling, urinary discomfort, and any other symptoms.   On PMHx the pt reports neuropathy, prostate surgery in 2000, denies Afib. On Social Hx the pt reports 10-14 glasses of wine each week  and denies any concern for excessive consumption. He denies smoking and  smoked cigarettes for one year a long time ago  On Family Hx the pt reports paternal heart attack.  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  Patient was last seen by me on 03/13/2023 and he was doing well overall.   Patient notes he has been doing well overall since our last visit. He denies any new infection issues, fever, chills, night sweats, unexpected weight loss, back pain, chest pain, abdominal pain, or leg swelling.   Patient has previous medical history of enlarged prostate, not an active problem. He had Transurethral resection of prostate in early 2000's.  He is complaint with all of his medications.     MEDICAL HISTORY:  Past Medical History:  Diagnosis Date   Allergy    ANEMIA, IRON DEFICIENCY 12/23/2009   Barrett's esophagus without dysplasia 05/2012   DIABETES MELLITUS, TYPE II 12/30/2006   ECZEMA 05/15/2009   GERD 12/30/2006   HYPERLIPIDEMIA 12/30/2006   HYPERTENSION 12/30/2006   HYPERTHYROIDISM 06/03/2008   Pernicious anemia 12/30/2006   Personal history of colonic polyps - adenoma 07/22/2004    SURGICAL HISTORY: Past Surgical History:  Procedure Laterality Date   COLONOSCOPY  multiple   COLONOSCOPY WITH PROPOFOL  07/27/2015   Dr.Gessner   ESOPHAGOGASTRODUODENOSCOPY  multiple   POLYPECTOMY     TRANSURETHRAL RESECTION OF PROSTATE  03/28/1998   SOCIAL HISTORY: Social History   Socioeconomic History   Marital status: Legally Separated    Spouse name: Not on file   Number of children:  Not on file   Years of education: Not on file   Highest education level: Not on file  Occupational History   Occupation: Retired from school system  Tobacco Use   Smoking status: Former   Smokeless tobacco: Never  Vaping Use   Vaping status: Never Used  Substance and Sexual Activity   Alcohol use: Yes    Alcohol/week: 7.0 - 10.0 standard drinks of alcohol    Types: 7 - 10 Glasses of  wine per week    Comment: 7-10 glasses wine weekly   Drug use: No   Sexual activity: Not on file  Other Topics Concern   Not on file  Social History Narrative   Married   Retired Merchandiser, 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, hospital [employer]   7 drinks/week   Former smoker   No drug use   Social Drivers of Corporate investment banker Strain: Low Risk  (01/20/2023)   Overall Financial Resource Strain (CARDIA)    Difficulty of Paying Living Expenses: Not hard at all  Food Insecurity: No Food Insecurity (01/20/2023)   Hunger Vital Sign    Worried About Running Out of Food in the Last Year: Never true    Ran Out of Food in the Last Year: Never true  Transportation Needs: No Transportation Needs (01/20/2023)   PRAPARE - Administrator, Civil Service (Medical): No    Lack of Transportation (Non-Medical): No  Physical Activity: Inactive (01/20/2023)   Exercise Vital Sign    Days of Exercise per Week: 0 days    Minutes of Exercise per Session: 0 min  Stress: No Stress Concern Present (01/20/2023)   [employer] of Occupational Health - Occupational Stress Questionnaire    Feeling of Stress : Only a little  Social Connections: Socially Isolated (01/20/2023)   Social Connection and Isolation Panel [NHANES]    Frequency of Communication with Friends and Family: Once a week    Frequency of Social Gatherings with Friends and Family: Never    Attends Religious Services: Never    Database administrator or Organizations: Yes    Attends Banker Meetings: Never    Marital Status: Separated  Intimate Partner Violence: Not At Risk (01/25/2023)   Humiliation, Afraid, Rape, and Kick questionnaire    Fear of Current or Ex-Partner: No    Emotionally Abused: No    Physically Abused: No    Sexually Abused: No    FAMILY HISTORY: Family History  Problem Relation Age of Onset   Heart attack Father        MI   Depression Son    COPD Son     Cancer Neg Hx    Colon cancer Neg Hx    Esophageal cancer Neg Hx    Stomach cancer Neg Hx     ALLERGIES:  has no known allergies.  MEDICATIONS:  Current Outpatient Medications  Medication Sig Dispense Refill   aspirin 81 MG tablet Take 1 tablet (81 mg total) by mouth daily. 90 tablet 3   calcitRIOL (ROCALTROL) 0.25 MCG capsule TAKE 1 CAPSULE (0.25 MCG TOTAL) BY MOUTH DAILY. TAKE MONDAY WEDNESDAY AND FRIDAY. 36 capsule 0   carvedilol (COREG) 3.125 MG tablet PLEASE SEE ATTACHED FOR DETAILED DIRECTIONS 180 tablet 1   Cholecalciferol (VITAMIN D) 2000 units CAPS Take 2,000 Units by mouth daily.     ezetimibe-simvastatin (VYTORIN) 10-80 MG tablet TAKE 1 TABLET BY MOUTH EVERY DAY 90 tablet 1  fenofibrate (TRICOR) 145 MG tablet TAKE 1 TABLET BY MOUTH EVERY DAY 90 tablet 2   glucose blood (ONETOUCH VERIO) test strip 1 each by Other route daily. And lancets 1/day 100 each 3   hydrochlorothiazide (HYDRODIURIL) 25 MG tablet TAKE 1 TABLET BY MOUTH EVERY DAY 90 tablet 2   latanoprost (XALATAN) 0.005 % ophthalmic solution Place 1 drop into both eyes at bedtime.     omeprazole (PRILOSEC) 20 MG capsule TAKE 1 CAPSULE BY MOUTH EVERY DAY 90 capsule 3   SPIKEVAX 50 MCG/0.5ML SUSY Inject into the muscle. (Patient not taking: Reported on 07/07/2022)     vitamin B-12 (CYANOCOBALAMIN) 1000 MCG tablet Take 1,000 mcg by mouth daily.     No current facility-administered medications for this visit.    REVIEW OF SYSTEMS:   10 Point review of Systems was done is negative except as noted above.  PHYSICAL EXAMINATION:  . Vitals:   04/18/23 1436  BP: (!) 186/81  Pulse: 88  Resp: 16  Temp: (!) 97.2 F (36.2 C)  SpO2: 100%    Filed Weights   04/18/23 1436  Weight: 247 lb (112 kg)    .Body mass index is 31.71 kg/m.  Marland Kitchen GENERAL:alert, in no acute distress and comfortable SKIN: no acute rashes, no significant lesions EYES: conjunctiva are pink and non-injected, sclera anicteric OROPHARYNX: MMM, no  exudates, no oropharyngeal erythema or ulceration NECK: supple, no JVD LYMPH:  no palpable lymphadenopathy in the cervical, axillary or inguinal regions LUNGS: clear to auscultation b/l with normal respiratory effort HEART: regular rate & rhythm ABDOMEN:  normoactive bowel sounds , non tender, not distended. Extremity: no pedal edema PSYCH: alert & oriented x 3 with fluent speech NEURO: no focal motor/sensory deficits   LABORATORY DATA:  I have reviewed the data as listed .    Latest Ref Rng & Units 04/18/2023    1:52 PM 04/07/2023    9:21 AM 03/13/2023    1:16 PM  CBC  WBC 4.0 - 10.5 K/uL 4.6  4.5  4.9   Hemoglobin 13.0 - 17.0 g/dL 8.7  8.2  8.2   Hematocrit 39.0 - 52.0 % 24.7  24.2  24.0   Platelets 150 - 400 K/uL 233  228  259    .CBC    Component Value Date/Time   WBC 4.5 04/07/2023 0921   RBC 2.18 (L) 04/07/2023 0921   HGB 8.2 (L) 04/07/2023 0921   HGB 8.2 (L) 03/13/2023 1316   HCT 24.2 (L) 04/07/2023 0921   HCT 31.1 (L) 09/07/2017 1405   PLT 228 04/07/2023 0921   PLT 259 03/13/2023 1316   MCV 111.0 (H) 04/07/2023 0921   MCH 37.6 (H) 04/07/2023 0921   MCHC 33.9 04/07/2023 0921   RDW 13.9 04/07/2023 0921   LYMPHSABS 2.3 04/07/2023 0921   MONOABS 0.4 04/07/2023 0921   EOSABS 0.2 04/07/2023 0921   BASOSABS 0.0 04/07/2023 0921    .    Latest Ref Rng & Units 04/18/2023    1:52 PM 03/13/2023    1:16 PM 09/13/2022    1:06 PM  CMP  Glucose 70 - 99 mg/dL 098  119  147   BUN 8 - 23 mg/dL 21  24  26    Creatinine 0.61 - 1.24 mg/dL 8.29  5.62  1.30   Sodium 135 - 145 mmol/L 139  139  137   Potassium 3.5 - 5.1 mmol/L 4.1  3.9  4.7   Chloride 98 - 111 mmol/L 103  104  105   CO2 22 - 32 mmol/L 26  29  24    Calcium 8.9 - 10.3 mg/dL 9.8  9.6  9.9   Total Protein 6.5 - 8.1 g/dL 6.5  5.9  6.2   Total Bilirubin 0.0 - 1.2 mg/dL 0.7  0.5  0.5   Alkaline Phos 38 - 126 U/L 23  21  19    AST 15 - 41 U/L 21  18  14    ALT 0 - 44 U/L 12  12  11     . Lab Results  Component  Value Date   IRON 98 03/13/2023   TIBC 298 03/13/2023   IRONPCTSAT 33 03/13/2023   (Iron and TIBC)  Lab Results  Component Value Date   FERRITIN 1,215 (H) 03/13/2023     Component     Latest Ref Rng & Units 09/07/2017  IgG (Immunoglobin G), Serum     700 - 1,600 mg/dL 604  IgA     61 - 540 mg/dL 981  IgM (Immunoglobulin M), Srm     20 - 172 mg/dL 191  Total Protein ELP     6.0 - 8.5 g/dL 6.7  Albumin SerPl Elph-Mcnc     2.9 - 4.4 g/dL 4.0  Alpha 1     0.0 - 0.4 g/dL 0.2  Alpha2 Glob SerPl Elph-Mcnc     0.4 - 1.0 g/dL 0.8  B-Globulin SerPl Elph-Mcnc     0.7 - 1.3 g/dL 1.1  Gamma Glob SerPl Elph-Mcnc     0.4 - 1.8 g/dL 0.6  M Protein SerPl Elph-Mcnc     Not Observed g/dL Not Observed  Globulin, Total     2.2 - 3.9 g/dL 2.7  Albumin/Glob SerPl     0.7 - 1.7 1.5  IFE 1      Comment  Please Note (HCV):      Comment  Folate, Hemolysate     Not Estab. ng/mL 204.0  HCT     37.5 - 51.0 % 31.1 (L)  Folate, RBC     >498 ng/mL 656  Vitamin B12     180 - 914 pg/mL 6,847 (H)  TSH     0.320 - 4.118 uIU/mL 1.016  Ferritin     22 - 316 ng/mL 867 (H)  Copper     72 - 166 ug/dL 97  Parietal Cell Antibody-IgG     0.0 - 20.0 Units 10.9  Intrinsic Factor     0.0 - 1.1 AU/mL 11.8 (H)    RADIOGRAPHIC STUDIES: I have personally reviewed the radiological images as listed and agreed with the findings in the report. CT BONE MARROW BIOPSY & ASPIRATION Result Date: 04/07/2023 CLINICAL DATA:  Progressive macrocytic anemia and need for bone marrow biopsy. EXAM: CT GUIDED BONE MARROW ASPIRATION AND BIOPSY ANESTHESIA/SEDATION: Moderate (conscious) sedation was employed during this procedure. A total of Versed 4.0 mg and Fentanyl 100 mcg was administered intravenously. Moderate Sedation Time: 17 minutes. The patient's level of consciousness and vital signs were monitored continuously by radiology nursing throughout the procedure under my direct supervision. PROCEDURE: The procedure  risks, benefits, and alternatives were explained to the patient. Questions regarding the procedure were encouraged and answered. The patient understands and consents to the procedure. A time out was performed prior to initiating the procedure. The right gluteal region was prepped with chlorhexidine. Sterile gown and sterile gloves were used for the procedure. Local anesthesia was provided with 1% Lidocaine. Under CT guidance, an 11 gauge On Control bone cutting  needle was advanced from a posterior approach into the right iliac bone. Needle positioning was confirmed with CT. Initial non heparinized and heparinized aspirate samples were obtained of bone marrow. Core biopsy was performed via the On Control drill needle. COMPLICATIONS: None FINDINGS: Very little liquid bone marrow was able to be aspirated despite 2 separate needle placements. Two separate intact core biopsies were able to be obtained. IMPRESSION: CT guided bone marrow biopsy of right posterior iliac bone. Very little liquid bone marrow was able to be obtained. Two separate core biopsy samples were obtained. Electronically Signed   By: Irish Lack M.D.   On: 04/07/2023 16:30    ASSESSMENT & PLAN:   74 y.o. male with  1. Macrocytic Anemia - multifactorial -- pernicious anemia with anti IF antibodies + Anemia from CKD + cannot r/o an element of low grade MDS ? ? ETOH use  , Low testosterone levels Review of patient's CBCs w/diff over the last two years reveals stability on the above abnormalities  Patient appears to have pernicious anemia with anti intrinsic factor antibodies. Currently no B12 or folate deficiency deficiency.but could have other B vit deficiencies. No monoclonal paraproteinemia. Concern for ETOH use Has some CKD that could be contributing to the anemia as well. No known liver disease No overt bleeding noted.  2. B12 deficiency -multifactorial -- pernicious anemia + previous metformin use + current PPI use.  3.   Elevated ferritin levels Patient previously had Barrett's esophagus and iron deficiency anemia.  Currently his ferritin is remained elevated likely in the context of significant previous iron replacement. -Hold off on any iron supplementation. HFE gene mutation study is negative for any genetic findings suggestive of hemochromatosis.  PLAN: -Discussed lab results from today, 04/19/2023, in detail with the patient. CBC shows patient is anemic with hgb of 8.7 g/dL with hct of 14.7%. CMP shows elevated creatinine of 1.95.  -Discussed Bone marrow biopsy result from 04/07/2023 in detail with the patient. Showed hypercellular and approximately 50%, the remaining marrow space is  hypocellular (5% or less with serous atrophy and/or fibrinous material. -Discussed [employer] results from 04/07/2023 in detail with the patient. Sample failed to yield metaphase cells.  -Discussed with the patient that the bone marrow biopsy did not give [place] enough results. However, given bone marrow biopsy and clinical symptoms, we can say pt has myelodysplasia.  -Educated the patient about myelodysplasia syndrome. -Discussed the option of another bone marrow biopsy since they did have enough sample or go with clinical symptoms and go with treatment for myelodysplasia syndrome. Patient wants to go with second option to proceed with treatment.  -Discussed the treatments for myelodysplasia syndrome, which would be either aranesp injection or Retacrit injection. This will be depending on insurance. Pt agrees with this plan. Aranesp injection is preferred.  -Discussed the need for testosterone replacement. Pt will get in contact with PCP regarding testosterone replacement. -Goal will be to keep hgb about 10.0 g/dL.  -Answered all of patient's questions. -Continue Vitamin B-12 and B-complex supplement. Recommend sublingual vitamin B-12 supplement and B-complex liquid version.   FOLLOW-UP: Aranesp evey 4 weeks x 4 doses with  labs MD visit in 3 months  The total time spent in the appointment was 30 minutes* .  All of the patient's questions were answered with apparent satisfaction. The patient knows to call the clinic with any problems, questions or concerns.   Wyvonnia Lora MD MS AAHIVMS [facility] Hematology/Oncology Physician [facility]  .*Total Encounter Time  as defined by the Centers for Medicare and Medicaid Services includes, in addition to the face-to-face time of a patient visit (documented in the note above) non-face-to-face time: obtaining and reviewing outside history, ordering and reviewing medications, tests or procedures, care coordination (communications with other health care professionals or caregivers) and documentation in the medical record.   I,Param Shah,acting as a Neurosurgeon for Wyvonnia Lora, MD.,have documented all relevant documentation on the behalf of Wyvonnia Lora, MD,as directed by  Wyvonnia Lora, MD while in the presence of Wyvonnia Lora, MD.  .I have reviewed the above documentation for accuracy and completeness, and I agree with the above. Johney Maine MD

## 2023-04-19 ENCOUNTER — Other Ambulatory Visit: Payer: Self-pay | Admitting: Family Medicine

## 2023-04-19 ENCOUNTER — Telehealth: Payer: Self-pay | Admitting: Hematology

## 2023-04-19 NOTE — Telephone Encounter (Signed)
Spoke with patient confirming upcoming appointment  

## 2023-04-23 ENCOUNTER — Other Ambulatory Visit: Payer: Self-pay | Admitting: Family Medicine

## 2023-04-24 ENCOUNTER — Encounter: Payer: Self-pay | Admitting: Hematology

## 2023-04-24 DIAGNOSIS — D469 Myelodysplastic syndrome, unspecified: Secondary | ICD-10-CM | POA: Insufficient documentation

## 2023-04-24 DIAGNOSIS — D631 Anemia in chronic kidney disease: Secondary | ICD-10-CM | POA: Insufficient documentation

## 2023-04-26 ENCOUNTER — Other Ambulatory Visit: Payer: Self-pay | Admitting: Hematology

## 2023-05-10 ENCOUNTER — Telehealth: Payer: Self-pay | Admitting: Family Medicine

## 2023-05-10 NOTE — Telephone Encounter (Signed)
Patient states he will be having a stair life installed . Would like a prescription so that it is covered . Form w/ details placed in provider folder . Please advise

## 2023-05-11 NOTE — Telephone Encounter (Signed)
Ok to send order.

## 2023-05-11 NOTE — Telephone Encounter (Signed)
Ok with me. Please place any necessary orders.

## 2023-05-15 ENCOUNTER — Encounter: Payer: Self-pay | Admitting: Family Medicine

## 2023-05-16 ENCOUNTER — Other Ambulatory Visit: Payer: Self-pay | Admitting: *Deleted

## 2023-05-16 NOTE — Telephone Encounter (Signed)
Rx send faxed to [phone] Patient notified

## 2023-05-17 NOTE — Telephone Encounter (Signed)
Rx was faxed on 05/15/2022

## 2023-05-18 ENCOUNTER — Other Ambulatory Visit: Payer: Self-pay

## 2023-05-18 DIAGNOSIS — D469 Myelodysplastic syndrome, unspecified: Secondary | ICD-10-CM

## 2023-05-19 ENCOUNTER — Inpatient Hospital Stay: Payer: Medicare PPO | Attending: Hematology

## 2023-05-19 ENCOUNTER — Inpatient Hospital Stay: Payer: Medicare PPO

## 2023-05-19 VITALS — BP 183/86 | HR 98 | Temp 98.5°F | Resp 17

## 2023-05-19 DIAGNOSIS — D46Z Other myelodysplastic syndromes: Secondary | ICD-10-CM | POA: Diagnosis not present

## 2023-05-19 DIAGNOSIS — D469 Myelodysplastic syndrome, unspecified: Secondary | ICD-10-CM

## 2023-05-19 DIAGNOSIS — D631 Anemia in chronic kidney disease: Secondary | ICD-10-CM

## 2023-05-19 LAB — IRON AND IRON BINDING CAPACITY (CC-WL,HP ONLY)
Iron: 97 ug/dL (ref 45–182)
Saturation Ratios: 30 % (ref 17.9–39.5)
TIBC: 325 ug/dL (ref 250–450)
UIBC: 228 ug/dL (ref 117–376)

## 2023-05-19 LAB — CBC WITH DIFFERENTIAL (CANCER CENTER ONLY)
Abs Immature Granulocytes: 0.02 10*3/uL (ref 0.00–0.07)
Basophils Absolute: 0 10*3/uL (ref 0.0–0.1)
Basophils Relative: 1 %
Eosinophils Absolute: 0.2 10*3/uL (ref 0.0–0.5)
Eosinophils Relative: 5 %
HCT: 25.4 % — ABNORMAL LOW (ref 39.0–52.0)
Hemoglobin: 8.8 g/dL — ABNORMAL LOW (ref 13.0–17.0)
Immature Granulocytes: 1 %
Lymphocytes Relative: 43 %
Lymphs Abs: 1.7 10*3/uL (ref 0.7–4.0)
MCH: 38.1 pg — ABNORMAL HIGH (ref 26.0–34.0)
MCHC: 34.6 g/dL (ref 30.0–36.0)
MCV: 110 fL — ABNORMAL HIGH (ref 80.0–100.0)
Monocytes Absolute: 0.4 10*3/uL (ref 0.1–1.0)
Monocytes Relative: 9 %
Neutro Abs: 1.7 10*3/uL (ref 1.7–7.7)
Neutrophils Relative %: 41 %
Platelet Count: 218 10*3/uL (ref 150–400)
RBC: 2.31 MIL/uL — ABNORMAL LOW (ref 4.22–5.81)
RDW: 13.4 % (ref 11.5–15.5)
WBC Count: 4 10*3/uL (ref 4.0–10.5)
nRBC: 0 % (ref 0.0–0.2)

## 2023-05-19 LAB — CMP (CANCER CENTER ONLY)
ALT: 10 U/L (ref 0–44)
AST: 16 U/L (ref 15–41)
Albumin: 3.9 g/dL (ref 3.5–5.0)
Alkaline Phosphatase: 22 U/L — ABNORMAL LOW (ref 38–126)
Anion gap: 8 (ref 5–15)
BUN: 22 mg/dL (ref 8–23)
CO2: 26 mmol/L (ref 22–32)
Calcium: 9.8 mg/dL (ref 8.9–10.3)
Chloride: 103 mmol/L (ref 98–111)
Creatinine: 1.65 mg/dL — ABNORMAL HIGH (ref 0.61–1.24)
GFR, Estimated: 44 mL/min — ABNORMAL LOW (ref >60)
Glucose, Bld: 127 mg/dL — ABNORMAL HIGH (ref 70–99)
Potassium: 3.8 mmol/L (ref 3.5–5.1)
Sodium: 137 mmol/L (ref 135–145)
Total Bilirubin: 0.5 mg/dL (ref 0.0–1.2)
Total Protein: 6.3 g/dL — ABNORMAL LOW (ref 6.5–8.1)

## 2023-05-19 LAB — FERRITIN: Ferritin: 957 ng/mL — ABNORMAL HIGH (ref 24–336)

## 2023-05-19 LAB — VITAMIN B12: Vitamin B-12: 944 pg/mL — ABNORMAL HIGH (ref 180–914)

## 2023-05-19 MED ORDER — EPOETIN ALFA-EPBX 40000 UNIT/ML IJ SOLN
40000.0000 [IU] | Freq: Once | INTRAMUSCULAR | Status: AC
  Administered 2023-05-19: 40000 [IU] via SUBCUTANEOUS
  Filled 2023-05-19: qty 1

## 2023-06-11 ENCOUNTER — Encounter: Payer: Self-pay | Admitting: Family Medicine

## 2023-06-12 NOTE — Telephone Encounter (Signed)
 Please check on availability for CPE

## 2023-06-15 ENCOUNTER — Other Ambulatory Visit: Payer: Self-pay

## 2023-06-15 DIAGNOSIS — D469 Myelodysplastic syndrome, unspecified: Secondary | ICD-10-CM

## 2023-06-16 ENCOUNTER — Inpatient Hospital Stay: Payer: Medicare PPO

## 2023-06-16 ENCOUNTER — Inpatient Hospital Stay: Payer: Medicare PPO | Attending: Hematology

## 2023-06-16 VITALS — BP 175/72 | HR 73 | Temp 98.7°F | Resp 18

## 2023-06-16 DIAGNOSIS — D469 Myelodysplastic syndrome, unspecified: Secondary | ICD-10-CM | POA: Diagnosis not present

## 2023-06-16 DIAGNOSIS — D631 Anemia in chronic kidney disease: Secondary | ICD-10-CM

## 2023-06-16 LAB — CMP (CANCER CENTER ONLY)
ALT: 10 U/L (ref 0–44)
AST: 20 U/L (ref 15–41)
Albumin: 3.8 g/dL (ref 3.5–5.0)
Alkaline Phosphatase: 21 U/L — ABNORMAL LOW (ref 38–126)
Anion gap: 10 (ref 5–15)
BUN: 26 mg/dL — ABNORMAL HIGH (ref 8–23)
CO2: 26 mmol/L (ref 22–32)
Calcium: 9.4 mg/dL (ref 8.9–10.3)
Chloride: 104 mmol/L (ref 98–111)
Creatinine: 1.86 mg/dL — ABNORMAL HIGH (ref 0.61–1.24)
GFR, Estimated: 38 mL/min — ABNORMAL LOW (ref >60)
Glucose, Bld: 118 mg/dL — ABNORMAL HIGH (ref 70–99)
Potassium: 4.1 mmol/L (ref 3.5–5.1)
Sodium: 140 mmol/L (ref 135–145)
Total Bilirubin: 0.5 mg/dL (ref 0.0–1.2)
Total Protein: 6.4 g/dL — ABNORMAL LOW (ref 6.5–8.1)

## 2023-06-16 LAB — CBC WITH DIFFERENTIAL (CANCER CENTER ONLY)
Abs Immature Granulocytes: 0.01 10*3/uL (ref 0.00–0.07)
Basophils Absolute: 0 10*3/uL (ref 0.0–0.1)
Basophils Relative: 1 %
Eosinophils Absolute: 0.1 10*3/uL (ref 0.0–0.5)
Eosinophils Relative: 4 %
HCT: 25.1 % — ABNORMAL LOW (ref 39.0–52.0)
Hemoglobin: 8.7 g/dL — ABNORMAL LOW (ref 13.0–17.0)
Immature Granulocytes: 0 %
Lymphocytes Relative: 42 %
Lymphs Abs: 1.6 10*3/uL (ref 0.7–4.0)
MCH: 37.2 pg — ABNORMAL HIGH (ref 26.0–34.0)
MCHC: 34.7 g/dL (ref 30.0–36.0)
MCV: 107.3 fL — ABNORMAL HIGH (ref 80.0–100.0)
Monocytes Absolute: 0.3 10*3/uL (ref 0.1–1.0)
Monocytes Relative: 8 %
Neutro Abs: 1.7 10*3/uL (ref 1.7–7.7)
Neutrophils Relative %: 45 %
Platelet Count: 203 10*3/uL (ref 150–400)
RBC: 2.34 MIL/uL — ABNORMAL LOW (ref 4.22–5.81)
RDW: 13.8 % (ref 11.5–15.5)
WBC Count: 3.7 10*3/uL — ABNORMAL LOW (ref 4.0–10.5)
nRBC: 0 % (ref 0.0–0.2)

## 2023-06-16 LAB — VITAMIN B12: Vitamin B-12: 1417 pg/mL — ABNORMAL HIGH (ref 180–914)

## 2023-06-16 LAB — IRON AND IRON BINDING CAPACITY (CC-WL,HP ONLY)
Iron: 97 ug/dL (ref 45–182)
Saturation Ratios: 32 % (ref 17.9–39.5)
TIBC: 308 ug/dL (ref 250–450)
UIBC: 211 ug/dL (ref 117–376)

## 2023-06-16 LAB — FERRITIN: Ferritin: 883 ng/mL — ABNORMAL HIGH (ref 24–336)

## 2023-06-16 MED ORDER — EPOETIN ALFA-EPBX 40000 UNIT/ML IJ SOLN
40000.0000 [IU] | Freq: Once | INTRAMUSCULAR | Status: AC
  Administered 2023-06-16: 40000 [IU] via SUBCUTANEOUS
  Filled 2023-06-16: qty 1

## 2023-06-16 NOTE — Progress Notes (Signed)
 Patient here for Retacrit injection.  Blood pressure elevated as noted.  Patient states he didn't take his blood pressure medication because he was fastening.  States the doctor told him not to eat.  Patient advised it's ok to take his medication because the labs done today due not cause for fastening.  Devan/pharmacist made aware thru secure chat and states to inform the patient to take his blood pressure medication as well.

## 2023-07-16 ENCOUNTER — Other Ambulatory Visit: Payer: Self-pay | Admitting: Family Medicine

## 2023-07-17 ENCOUNTER — Other Ambulatory Visit: Payer: Self-pay

## 2023-07-17 DIAGNOSIS — D469 Myelodysplastic syndrome, unspecified: Secondary | ICD-10-CM

## 2023-07-17 DIAGNOSIS — D539 Nutritional anemia, unspecified: Secondary | ICD-10-CM

## 2023-07-18 ENCOUNTER — Inpatient Hospital Stay: Payer: Medicare PPO

## 2023-07-18 ENCOUNTER — Inpatient Hospital Stay: Payer: Medicare PPO | Attending: Hematology | Admitting: Hematology

## 2023-07-18 ENCOUNTER — Encounter: Payer: Self-pay | Admitting: Hematology

## 2023-07-18 ENCOUNTER — Other Ambulatory Visit: Payer: Self-pay | Admitting: Hematology

## 2023-07-18 VITALS — BP 134/71 | HR 84 | Temp 97.6°F | Resp 18 | Ht 73.5 in | Wt 248.7 lb

## 2023-07-18 DIAGNOSIS — D539 Nutritional anemia, unspecified: Secondary | ICD-10-CM

## 2023-07-18 DIAGNOSIS — D469 Myelodysplastic syndrome, unspecified: Secondary | ICD-10-CM | POA: Diagnosis not present

## 2023-07-18 DIAGNOSIS — D631 Anemia in chronic kidney disease: Secondary | ICD-10-CM

## 2023-07-18 LAB — CBC WITH DIFFERENTIAL (CANCER CENTER ONLY)
Abs Immature Granulocytes: 0.01 10*3/uL (ref 0.00–0.07)
Basophils Absolute: 0 10*3/uL (ref 0.0–0.1)
Basophils Relative: 1 %
Eosinophils Absolute: 0.2 10*3/uL (ref 0.0–0.5)
Eosinophils Relative: 4 %
HCT: 27 % — ABNORMAL LOW (ref 39.0–52.0)
Hemoglobin: 9.2 g/dL — ABNORMAL LOW (ref 13.0–17.0)
Immature Granulocytes: 0 %
Lymphocytes Relative: 34 %
Lymphs Abs: 1.4 10*3/uL (ref 0.7–4.0)
MCH: 36.7 pg — ABNORMAL HIGH (ref 26.0–34.0)
MCHC: 34.1 g/dL (ref 30.0–36.0)
MCV: 107.6 fL — ABNORMAL HIGH (ref 80.0–100.0)
Monocytes Absolute: 0.4 10*3/uL (ref 0.1–1.0)
Monocytes Relative: 9 %
Neutro Abs: 2.1 10*3/uL (ref 1.7–7.7)
Neutrophils Relative %: 52 %
Platelet Count: 229 10*3/uL (ref 150–400)
RBC: 2.51 MIL/uL — ABNORMAL LOW (ref 4.22–5.81)
RDW: 13.6 % (ref 11.5–15.5)
WBC Count: 4.1 10*3/uL (ref 4.0–10.5)
nRBC: 0 % (ref 0.0–0.2)

## 2023-07-18 LAB — CMP (CANCER CENTER ONLY)
ALT: 10 U/L (ref 0–44)
AST: 17 U/L (ref 15–41)
Albumin: 4 g/dL (ref 3.5–5.0)
Alkaline Phosphatase: 23 U/L — ABNORMAL LOW (ref 38–126)
Anion gap: 6 (ref 5–15)
BUN: 29 mg/dL — ABNORMAL HIGH (ref 8–23)
CO2: 25 mmol/L (ref 22–32)
Calcium: 9.6 mg/dL (ref 8.9–10.3)
Chloride: 108 mmol/L (ref 98–111)
Creatinine: 2.04 mg/dL — ABNORMAL HIGH (ref 0.61–1.24)
GFR, Estimated: 34 mL/min — ABNORMAL LOW (ref >60)
Glucose, Bld: 126 mg/dL — ABNORMAL HIGH (ref 70–99)
Potassium: 5.2 mmol/L — ABNORMAL HIGH (ref 3.5–5.1)
Sodium: 139 mmol/L (ref 135–145)
Total Bilirubin: 0.5 mg/dL (ref 0.0–1.2)
Total Protein: 6.5 g/dL (ref 6.5–8.1)

## 2023-07-18 LAB — IRON AND IRON BINDING CAPACITY (CC-WL,HP ONLY)
Iron: 117 ug/dL (ref 45–182)
Saturation Ratios: 35 % (ref 17.9–39.5)
TIBC: 330 ug/dL (ref 250–450)
UIBC: 213 ug/dL (ref 117–376)

## 2023-07-18 LAB — VITAMIN B12: Vitamin B-12: 976 pg/mL — ABNORMAL HIGH (ref 180–914)

## 2023-07-18 LAB — FERRITIN: Ferritin: 1625 ng/mL — ABNORMAL HIGH (ref 24–336)

## 2023-07-18 MED ORDER — EPOETIN ALFA-EPBX 40000 UNIT/ML IJ SOLN
40000.0000 [IU] | Freq: Once | INTRAMUSCULAR | Status: AC
  Administered 2023-07-18: 40000 [IU] via SUBCUTANEOUS
  Filled 2023-07-18: qty 1

## 2023-07-18 NOTE — Progress Notes (Signed)
 HEMATOLOGY/ONCOLOGY CLINIC NOTE  Date of Service: 07/18/23     Patient Care Team: Rodney Clamp, MD as PCP - General (Family Medicine) Dema Filler, MD as Attending Physician (Ophthalmology) Ann Barnacle, MD (Inactive) as Attending Physician (Urology) Katie Parks, MD as Consulting Physician (Nephrology) Terrilee Few, PT as Physical Therapist (Physical Therapy)  CHIEF COMPLAINTS/PURPOSE OF CONSULTATION:  Follow-up for multifactorial anemia  HISTORY OF PRESENTING ILLNESS:   Mitchell Alvarado is a wonderful 73 y.o. male who has been referred to us  by Dr Gwyndolyn Lerner for evaluation and management of anemia. The pt reports that he is doing well overall.   The pt reports that he has been taking monthly Vitamin B12 injections for the last 4-5 years, and his deficiency was first realized through routine blood work. He was taken off Metformin  at the time. He has taken Omeprazole  for 20 years for his Barrett's esophagus. He denies any food intolerances or thyroid  problems. He notes that he has had some fatigue which he attributes to getting older.   He has seen a nephrologist several years ago and was discharged after replacing his Vitamin D  satisfactorily and he denies any liver problems as well.   Most recent lab results (05/29/17) of CBC  is as follows: all values are WNL except for WBC at 3.5k, RBC at 3.04, HGB at 11.3, HCT at 32.9, MCV at 108.0.  Review of CBC w/diff over the last two years reveals stability in the above abnormalities.   On review of systems, pt reports some fatigue, stable weight, and denies problems of infection, bleeding, back pains, pain along the spine, abdominal pains, changes in bowel habits, leg selling, urinary discomfort, and any other symptoms.   On PMHx the pt reports neuropathy, prostate surgery in 2000, denies Afib. On Social Hx the pt reports 10-14 glasses of wine each week  and denies any concern for excessive consumption. He denies smoking and  smoked cigarettes for one year a long time ago  On Family Hx the pt reports paternal heart attack.  Interval History:   Mitchell Alvarado is here for continued evaluation and management of his multifactorial anemia.  Patient was last seen by me on 04/18/2023 and he was doing well overall.   Patient notes he has been doing well overall since our last visit. He has been tolerating his Retacrit  injection well without any new or severe toxicities.   He denies any new infection issues, fever, chills, night sweats, unexpected weight loss, back pain, chest pain, abdominal pain, or leg swelling. He does complain of mild muscle weakness.   Patient notes that his blood pressure have in the normal range at home.   He has PmHx of enlarged prostate, but denies prostate cancer. He denies FmHx of prostate cancer.   Patient has not followed up with his PCP regarding testosterone  replacement.   MEDICAL HISTORY:  Past Medical History:  Diagnosis Date   Allergy    ANEMIA, IRON DEFICIENCY 12/23/2009   Barrett's esophagus without dysplasia 05/2012   DIABETES MELLITUS, TYPE II 12/30/2006   ECZEMA 05/15/2009   GERD 12/30/2006   HYPERLIPIDEMIA 12/30/2006   HYPERTENSION 12/30/2006   HYPERTHYROIDISM 06/03/2008   Pernicious anemia 12/30/2006   Personal history of colonic polyps - adenoma 07/22/2004    SURGICAL HISTORY: Past Surgical History:  Procedure Laterality Date   COLONOSCOPY  multiple   COLONOSCOPY WITH PROPOFOL   07/27/2015   Dr.Gessner   ESOPHAGOGASTRODUODENOSCOPY  multiple   POLYPECTOMY  TRANSURETHRAL RESECTION OF PROSTATE  03/28/1998   SOCIAL HISTORY: Social History   Socioeconomic History   Marital status: Legally Separated    Spouse name: Not on file   Number of children: Not on file   Years of education: Not on file   Highest education level: Not on file  Occupational History   Occupation: Retired from school system  Tobacco Use   Smoking status: Former    Smokeless tobacco: Never  Vaping Use   Vaping status: Never Used  Substance and Sexual Activity   Alcohol use: Yes    Alcohol/week: 7.0 - 10.0 standard drinks of alcohol    Types: 7 - 10 Glasses of wine per week    Comment: 7-10 glasses wine weekly   Drug use: No   Sexual activity: Not on file  Other Topics Concern   Not on file  Social History Narrative   Married   Retired Merchandiser, 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, hospital [employer]   7 drinks/week   Former smoker   No drug use   Social Drivers of Corporate investment banker Strain: Low Risk  (01/20/2023)   Overall Financial Resource Strain (CARDIA)    Difficulty of Paying Living Expenses: Not hard at all  Food Insecurity: No Food Insecurity (01/20/2023)   Hunger Vital Sign    Worried About Running Out of Food in the Last Year: Never true    Ran Out of Food in the Last Year: Never true  Transportation Needs: No Transportation Needs (01/20/2023)   PRAPARE - Administrator, Civil Service (Medical): No    Lack of Transportation (Non-Medical): No  Physical Activity: Inactive (01/20/2023)   Exercise Vital Sign    Days of Exercise per Week: 0 days    Minutes of Exercise per Session: 0 min  Stress: No Stress Concern Present (01/20/2023)   [employer] of Occupational Health - Occupational Stress Questionnaire    Feeling of Stress : Only a little  Social Connections: Socially Isolated (01/20/2023)   Social Connection and Isolation Panel [NHANES]    Frequency of Communication with Friends and Family: Once a week    Frequency of Social Gatherings with Friends and Family: Never    Attends Religious Services: Never    Database administrator or Organizations: Yes    Attends Banker Meetings: Never    Marital Status: Separated  Intimate Partner Violence: Not At Risk (01/25/2023)   Humiliation, Afraid, Rape, and Kick questionnaire    Fear of Current or Ex-Partner: No    Emotionally  Abused: No    Physically Abused: No    Sexually Abused: No    FAMILY HISTORY: Family History  Problem Relation Age of Onset   Heart attack Father        MI   Depression Son    COPD Son    Cancer Neg Hx    Colon cancer Neg Hx    Esophageal cancer Neg Hx    Stomach cancer Neg Hx     ALLERGIES:  has no known allergies.  MEDICATIONS:  Current Outpatient Medications  Medication Sig Dispense Refill   aspirin  81 MG tablet Take 1 tablet (81 mg total) by mouth daily. 90 tablet 3   calcitRIOL  (ROCALTROL ) 0.25 MCG capsule TAKE 1 CAPSULE (0.25 MCG TOTAL) BY MOUTH DAILY. TAKE MONDAY WEDNESDAY AND FRIDAY. 36 capsule 0   carvedilol  (COREG ) 3.125 MG tablet TAKE 1 TAB BY MOUTH 2 TIMES DAILY. PLEASE  CALL [phone] TO SCHEDULE YOUR PHYSICAL IN APRIL FOR FURTHER REFILLS 180 tablet 1   Cholecalciferol (VITAMIN D ) 2000 units CAPS Take 2,000 Units by mouth daily.     ezetimibe -simvastatin  (VYTORIN ) 10-80 MG tablet TAKE 1 TABLET BY MOUTH EVERY DAY 90 tablet 1   fenofibrate  (TRICOR ) 145 MG tablet TAKE 1 TABLET BY MOUTH EVERY DAY 90 tablet 0   glucose blood (ONETOUCH VERIO) test strip 1 each by Other route daily. And lancets 1/day 100 each 3   hydrochlorothiazide  (HYDRODIURIL ) 25 MG tablet TAKE 1 TABLET BY MOUTH EVERY DAY 90 tablet 0   latanoprost (XALATAN) 0.005 % ophthalmic solution Place 1 drop into both eyes at bedtime.     omeprazole  (PRILOSEC ) 20 MG capsule TAKE 1 CAPSULE BY MOUTH EVERY DAY 90 capsule 3   SPIKEVAX 50 MCG/0.5ML SUSY Inject into the muscle. (Patient not taking: Reported on 07/07/2022)     vitamin B-12 (CYANOCOBALAMIN ) 1000 MCG tablet Take 1,000 mcg by mouth daily.     No current facility-administered medications for this visit.    REVIEW OF SYSTEMS:   10 Point review of Systems was done is negative except as noted above.  PHYSICAL EXAMINATION:  . Vitals:   07/18/23 1453  BP: 134/71  Pulse: 84  Resp: 18  Temp: 97.6 F (36.4 C)  SpO2: 100%   Filed Weights   07/18/23  1453  Weight: 248 lb 11.2 oz (112.8 kg)  .Body mass index is 32.37 kg/m.  Aaron Aas GENERAL:alert, in no acute distress and comfortable SKIN: no acute rashes, no significant lesions EYES: conjunctiva are pink and non-injected, sclera anicteric OROPHARYNX: MMM, no exudates, no oropharyngeal erythema or ulceration NECK: supple, no JVD LYMPH:  no palpable lymphadenopathy in the cervical, axillary or inguinal regions LUNGS: clear to auscultation b/l with normal respiratory effort HEART: regular rate & rhythm ABDOMEN:  normoactive bowel sounds , non tender, not distended. Extremity: no pedal edema PSYCH: alert & oriented x 3 with fluent speech NEURO: no focal motor/sensory deficits   LABORATORY DATA:  I have reviewed the data as listed .    Latest Ref Rng & Units 07/18/2023    2:37 PM 06/16/2023    2:02 PM 05/19/2023    1:51 PM  CBC  WBC 4.0 - 10.5 K/uL 4.1  3.7  4.0   Hemoglobin 13.0 - 17.0 g/dL 9.2  8.7  8.8   Hematocrit 39.0 - 52.0 % 27.0  25.1  25.4   Platelets 150 - 400 K/uL 229  203  218    .CBC    Component Value Date/Time   WBC 4.1 07/18/2023 1437   WBC 4.5 04/07/2023 0921   RBC 2.51 (L) 07/18/2023 1437   HGB 9.2 (L) 07/18/2023 1437   HCT 27.0 (L) 07/18/2023 1437   HCT 31.1 (L) 09/07/2017 1405   PLT 229 07/18/2023 1437   MCV 107.6 (H) 07/18/2023 1437   MCH 36.7 (H) 07/18/2023 1437   MCHC 34.1 07/18/2023 1437   RDW 13.6 07/18/2023 1437   LYMPHSABS 1.4 07/18/2023 1437   MONOABS 0.4 07/18/2023 1437   EOSABS 0.2 07/18/2023 1437   BASOSABS 0.0 07/18/2023 1437    .    Latest Ref Rng & Units 07/18/2023    2:37 PM 06/16/2023    2:02 PM 05/19/2023    1:51 PM  CMP  Glucose 70 - 99 mg/dL 098  119  147   BUN 8 - 23 mg/dL 29  26  22    Creatinine 0.61 - 1.24 mg/dL 8.29  1.86  1.65   Sodium 135 - 145 mmol/L 139  140  137   Potassium 3.5 - 5.1 mmol/L 5.2  4.1  3.8   Chloride 98 - 111 mmol/L 108  104  103   CO2 22 - 32 mmol/L 25  26  26    Calcium 8.9 - 10.3 mg/dL 9.6  9.4  9.8    Total Protein 6.5 - 8.1 g/dL 6.5  6.4  6.3   Total Bilirubin 0.0 - 1.2 mg/dL 0.5  0.5  0.5   Alkaline Phos 38 - 126 U/L 23  21  22    AST 15 - 41 U/L 17  20  16    ALT 0 - 44 U/L 10  10  10     . Lab Results  Component Value Date   IRON 97 06/16/2023   TIBC 308 06/16/2023   IRONPCTSAT 32 06/16/2023   (Iron and TIBC)  Lab Results  Component Value Date   FERRITIN 1,625 (H) 07/18/2023     Component     Latest Ref Rng & Units 09/07/2017  IgG (Immunoglobin G), Serum     700 - 1,600 mg/dL 161  IgA     61 - 096 mg/dL 045  IgM (Immunoglobulin M), Srm     20 - 172 mg/dL 409  Total Protein ELP     6.0 - 8.5 g/dL 6.7  Albumin SerPl Elph-Mcnc     2.9 - 4.4 g/dL 4.0  Alpha 1     0.0 - 0.4 g/dL 0.2  Alpha2 Glob SerPl Elph-Mcnc     0.4 - 1.0 g/dL 0.8  B-Globulin SerPl Elph-Mcnc     0.7 - 1.3 g/dL 1.1  Gamma Glob SerPl Elph-Mcnc     0.4 - 1.8 g/dL 0.6  M Protein SerPl Elph-Mcnc     Not Observed g/dL Not Observed  Globulin, Total     2.2 - 3.9 g/dL 2.7  Albumin/Glob SerPl     0.7 - 1.7 1.5  IFE 1      Comment  Please Note (HCV):      Comment  Folate, Hemolysate     Not Estab. ng/mL 204.0  HCT     37.5 - 51.0 % 31.1 (L)  Folate, RBC     >498 ng/mL 656  Vitamin B12     180 - 914 pg/mL 6,847 (H)  TSH     0.320 - 4.118 uIU/mL 1.016  Ferritin     22 - 316 ng/mL 867 (H)  Copper      72 - 166 ug/dL 97  Parietal Cell Antibody-IgG     0.0 - 20.0 Units 10.9  Intrinsic Factor     0.0 - 1.1 AU/mL 11.8 (H)    RADIOGRAPHIC STUDIES: I have personally reviewed the radiological images as listed and agreed with the findings in the report. No results found.   ASSESSMENT & PLAN:   73 y.o. male with  1. Macrocytic Anemia - multifactorial -- pernicious anemia with anti IF antibodies + Anemia from CKD + cannot r/o an element of low grade MDS ? ? ETOH use  , Low testosterone  levels Review of patient's CBCs w/diff over the last two years reveals stability on the above  abnormalities  Patient appears to have pernicious anemia with anti intrinsic factor antibodies. Currently no B12 or folate deficiency deficiency.but could have other B vit deficiencies. No monoclonal paraproteinemia. Concern for ETOH use Has some CKD that could be contributing to the anemia as well. No known liver  disease No overt bleeding noted.  2. B12 deficiency -multifactorial -- pernicious anemia + previous metformin  use + current PPI use.  3.  Elevated ferritin levels Patient previously had Barrett's esophagus and iron deficiency anemia.  Currently his ferritin is remained elevated likely in the context of significant previous iron replacement. -Hold off on any iron supplementation. HFE gene mutation study is negative for any genetic findings suggestive of hemochromatosis.  PLAN: -Discussed lab results from today, 07/18/2023, in detail with the patient. CBC shows low Hgb of 9.2 g/dL with low Hct of 09.8%. -Discussed the need for testosterone  replacement. Pt will get in contact with PCP regarding testosterone  replacement.  -Discussed with the patient that testosterone  replacement should help with fatigue and muscle weakness.  -Discussed the option of getting referred to PT. Patient wants to hold off on PT.  -Patient has been tolerating his current Retacrit  injection dosage well without any new or severe toxicities.  -Continue with Retacrit  injection.  -Goal will be to keep hgb about 10.0 g/dL.  -Answered all of patient's questions. -Continue Vitamin B-12 and B-complex supplement.   FOLLOW-UP: Erythropoeitin shot evey 4 weeks x 4 doses with labs MD visit in 3 months  The total time spent in the appointment was 21 minutes* .  All of the patient's questions were answered with apparent satisfaction. The patient knows to call the clinic with any problems, questions or concerns.   Jacquelyn Matt MD MS AAHIVMS [facility] Hematology/Oncology Physician [facility]  .*Total  Encounter Time as defined by the Centers for Medicare and Medicaid Services includes, in addition to the face-to-face time of a patient visit (documented in the note above) non-face-to-face time: obtaining and reviewing outside history, ordering and reviewing medications, tests or procedures, care coordination (communications with other health care professionals or caregivers) and documentation in the medical record.   I,Param Shah,acting as a Neurosurgeon for Jacquelyn Matt, MD.,have documented all relevant documentation on the behalf of Jacquelyn Matt, MD,as directed by  Jacquelyn Matt, MD while in the presence of Jacquelyn Matt, MD.  .I have reviewed the above documentation for accuracy and completeness, and I agree with the above. .Janie Capp Kishore Dudley Mages MD

## 2023-07-25 ENCOUNTER — Encounter: Payer: Self-pay | Admitting: Hematology

## 2023-08-13 ENCOUNTER — Encounter: Payer: Self-pay | Admitting: Family Medicine

## 2023-08-14 ENCOUNTER — Other Ambulatory Visit (HOSPITAL_COMMUNITY): Payer: Self-pay

## 2023-08-14 ENCOUNTER — Telehealth: Payer: Self-pay

## 2023-08-14 ENCOUNTER — Ambulatory Visit: Admitting: Family Medicine

## 2023-08-14 ENCOUNTER — Encounter: Payer: Self-pay | Admitting: Hematology

## 2023-08-14 VITALS — BP 139/80 | HR 87 | Temp 97.5°F | Ht 73.5 in | Wt 248.6 lb

## 2023-08-14 DIAGNOSIS — E1159 Type 2 diabetes mellitus with other circulatory complications: Secondary | ICD-10-CM | POA: Diagnosis not present

## 2023-08-14 DIAGNOSIS — R7989 Other specified abnormal findings of blood chemistry: Secondary | ICD-10-CM

## 2023-08-14 DIAGNOSIS — N2581 Secondary hyperparathyroidism of renal origin: Secondary | ICD-10-CM

## 2023-08-14 DIAGNOSIS — I152 Hypertension secondary to endocrine disorders: Secondary | ICD-10-CM | POA: Diagnosis not present

## 2023-08-14 MED ORDER — TESTOSTERONE 20.25 MG/ACT (1.62%) TD GEL: 1.0000 | Freq: Every day | TRANSDERMAL | 0 refills | Status: DC

## 2023-08-14 MED ORDER — CALCITRIOL 0.25 MCG PO CAPS: 0.2500 ug | ORAL_CAPSULE | ORAL | 0 refills | Status: DC

## 2023-08-14 NOTE — Patient Instructions (Addendum)
 It was very nice to see you today!  We will check blood work today.  Please start the testosterone  gel.  Please come back in 2 to 4 weeks for your annual physical with labs.  Let me know if you have any issues with the testosterone .  No follow-ups on file.   Take care, Dr Daneil Dunker  PLEASE NOTE:  If you had any lab tests, please let us  know if you have not heard back within a few days. You may see your results on mychart before we have a chance to review them but we will give you a call once they are reviewed by us .   If we ordered any referrals today, please let us  know if you have not heard from their office within the next week.   If you had any urgent prescriptions sent in today, please check with the pharmacy within an hour of our visit to make sure the prescription was transmitted appropriately.   Please try these tips to maintain a healthy lifestyle:  Eat at least 3 REAL meals and 1-2 snacks per day.  Aim for no more than 5 hours between eating.  If you eat breakfast, please do so within one hour of getting up.   Each meal should contain half fruits/vegetables, one quarter protein, and one quarter carbs (no bigger than a computer mouse)  Cut down on sweet beverages. This includes juice, soda, and sweet tea.   Drink at least 1 glass of water with each meal and aim for at least 8 glasses per day  Exercise at least 150 minutes every week.

## 2023-08-14 NOTE — Assessment & Plan Note (Signed)
 Blood pressure at goal today on Coreg  3.125 mg twice daily and HCTZ 25 mg daily.  Did discuss with patient that testosterone  may increase his blood pressure readings.  He will continue to monitor at home and follow-up with us  in a few weeks for his physical.

## 2023-08-14 NOTE — Assessment & Plan Note (Signed)
 Had lengthy discussion with patient today regarding his low testosterone  levels.  He has multiple low readings in the chart however the last reading was over a year ago.  We will obtain a new baseline today.  We did discuss benefits and risk of starting testosterone  replacement including increased blood counts and energy levels versus increased risk for cardiovascular disease and certain forms of cancer.  We also did discuss different forms of testosterone  placement including IM injections versus transdermal replacement.  He is interested in starting transdermal replacement at this point.  Will send a prescription in for AndroGel .  We are checking baseline testosterone  today and also checking a PSA.  He has frequently been getting CBC and c-Met through heme-onc which showed stable anemia-do not need to repeat this today.  He will come back in a couple of weeks for his annual physical and we can recheck labs at that time.

## 2023-08-14 NOTE — Assessment & Plan Note (Signed)
 We can recheck his calcium and parathyroid  hormone level with next blood draw.  Will refill his calcitriol  0.25 mcg 3 times weekly.

## 2023-08-14 NOTE — Telephone Encounter (Signed)
Please schedule an appointment with Dr Parker   

## 2023-08-14 NOTE — Telephone Encounter (Addendum)
    Pharmacy Patient Advocate Encounter   Received notification from CoverMyMeds that prior authorization for Testosterone  20.25 MG/ACT(1.62%) gel is required/requested.   Insurance verification completed.   The patient is insured through [employer] .   Per test claim: PA required; PA submitted to above mentioned insurance via CoverMyMeds Key/confirmation #/EOC  (Key: [id number])   Status is pending

## 2023-08-14 NOTE — Progress Notes (Signed)
   Mitchell Alvarado is a 73 y.o. male who presents today for an office visit.  Assessment/Plan:  Chronic Problems Addressed Today: Low testosterone  Had lengthy discussion with patient today regarding his low testosterone  levels.  He has multiple low readings in the chart however the last reading was over a year ago.  We will obtain a new baseline today.  We did discuss benefits and risk of starting testosterone  replacement including increased blood counts and energy levels versus increased risk for cardiovascular disease and certain forms of cancer.  We also did discuss different forms of testosterone  placement including IM injections versus transdermal replacement.  He is interested in starting transdermal replacement at this point.  Will send a prescription in for AndroGel .  We are checking baseline testosterone  today and also checking a PSA.  He has frequently been getting CBC and c-Met through heme-onc which showed stable anemia-do not need to repeat this today.  He will come back in a couple of weeks for his annual physical and we can recheck labs at that time.  Hypertension associated with diabetes (HCC) Blood pressure at goal today on Coreg  3.125 mg twice daily and HCTZ 25 mg daily.  Did discuss with patient that testosterone  may increase his blood pressure readings.  He will continue to monitor at home and follow-up with us  in a few weeks for his physical.  Hyperparathyroidism, secondary (HCC) We can recheck his calcium and parathyroid  hormone level with next blood draw.  Will refill his calcitriol  0.25 mcg 3 times weekly.     Subjective:  HPI:  See A/P for status of chronic conditions.  Patient is here today to discuss low testosterone .  He was last here a year ago.  P we check labs at that time for his annual physical which did show low testosterone .  We recommended referral to urology however he deferred at that time.  Since our last visit he has been following with hematology for  anemia.  They have recommended that he start testosterone  replacement to also help with his red blood cell counts.  He is interested in starting this.  He also does note that he has have some issues with fatigue as well.      Objective:  Physical Exam: BP 139/80   Pulse 87   Temp (!) 97.5 F (36.4 C) (Temporal)   Ht 6' 1.5" (1.867 m)   Wt 248 lb 9.6 oz (112.8 kg)   SpO2 97%   BMI 32.35 kg/m   Gen: No acute distress, resting comfortably CV: Regular rate and rhythm with no murmurs appreciated Pulm: Normal work of breathing, clear to auscultation bilaterally with no crackles, wheezes, or rhonchi Neuro: Grossly normal, moves all extremities Psych: Normal affect and thought content      [name] M. Daneil Dunker, MD 08/14/2023 3:07 PM

## 2023-08-15 ENCOUNTER — Other Ambulatory Visit: Payer: Self-pay | Admitting: *Deleted

## 2023-08-15 DIAGNOSIS — D631 Anemia in chronic kidney disease: Secondary | ICD-10-CM

## 2023-08-15 DIAGNOSIS — D469 Myelodysplastic syndrome, unspecified: Secondary | ICD-10-CM

## 2023-08-15 LAB — TESTOSTERONE: Testosterone: 185.65 ng/dL — ABNORMAL LOW (ref 300.00–890.00)

## 2023-08-15 LAB — PSA: PSA: 0.15 ng/mL (ref 0.10–4.00)

## 2023-08-16 ENCOUNTER — Inpatient Hospital Stay: Payer: Medicare PPO

## 2023-08-16 ENCOUNTER — Inpatient Hospital Stay: Payer: Medicare PPO | Attending: Hematology

## 2023-08-16 ENCOUNTER — Other Ambulatory Visit: Payer: Self-pay

## 2023-08-16 ENCOUNTER — Ambulatory Visit: Payer: Self-pay | Admitting: Family Medicine

## 2023-08-16 ENCOUNTER — Other Ambulatory Visit (HOSPITAL_COMMUNITY): Payer: Self-pay

## 2023-08-16 VITALS — BP 166/61 | HR 75 | Temp 98.1°F | Resp 16

## 2023-08-16 DIAGNOSIS — D469 Myelodysplastic syndrome, unspecified: Secondary | ICD-10-CM | POA: Insufficient documentation

## 2023-08-16 DIAGNOSIS — D631 Anemia in chronic kidney disease: Secondary | ICD-10-CM

## 2023-08-16 LAB — CBC WITH DIFFERENTIAL (CANCER CENTER ONLY)
Abs Immature Granulocytes: 0.01 10*3/uL (ref 0.00–0.07)
Basophils Absolute: 0.1 10*3/uL (ref 0.0–0.1)
Basophils Relative: 1 %
Eosinophils Absolute: 0.2 10*3/uL (ref 0.0–0.5)
Eosinophils Relative: 4 %
HCT: 27 % — ABNORMAL LOW (ref 39.0–52.0)
Hemoglobin: 9.4 g/dL — ABNORMAL LOW (ref 13.0–17.0)
Immature Granulocytes: 0 %
Lymphocytes Relative: 40 %
Lymphs Abs: 1.8 10*3/uL (ref 0.7–4.0)
MCH: 35.9 pg — ABNORMAL HIGH (ref 26.0–34.0)
MCHC: 34.8 g/dL (ref 30.0–36.0)
MCV: 103.1 fL — ABNORMAL HIGH (ref 80.0–100.0)
Monocytes Absolute: 0.3 10*3/uL (ref 0.1–1.0)
Monocytes Relative: 7 %
Neutro Abs: 2.2 10*3/uL (ref 1.7–7.7)
Neutrophils Relative %: 48 %
Platelet Count: 227 10*3/uL (ref 150–400)
RBC: 2.62 MIL/uL — ABNORMAL LOW (ref 4.22–5.81)
RDW: 13.7 % (ref 11.5–15.5)
WBC Count: 4.6 10*3/uL (ref 4.0–10.5)
nRBC: 0 % (ref 0.0–0.2)

## 2023-08-16 LAB — CMP (CANCER CENTER ONLY)
ALT: 14 U/L (ref 0–44)
AST: 23 U/L (ref 15–41)
Albumin: 4.2 g/dL (ref 3.5–5.0)
Alkaline Phosphatase: 21 U/L — ABNORMAL LOW (ref 38–126)
Anion gap: 10 (ref 5–15)
BUN: 35 mg/dL — ABNORMAL HIGH (ref 8–23)
CO2: 21 mmol/L — ABNORMAL LOW (ref 22–32)
Calcium: 9.6 mg/dL (ref 8.9–10.3)
Chloride: 108 mmol/L (ref 98–111)
Creatinine: 2.09 mg/dL — ABNORMAL HIGH (ref 0.61–1.24)
GFR, Estimated: 33 mL/min — ABNORMAL LOW (ref >60)
Glucose, Bld: 117 mg/dL — ABNORMAL HIGH (ref 70–99)
Potassium: 4.1 mmol/L (ref 3.5–5.1)
Sodium: 139 mmol/L (ref 135–145)
Total Bilirubin: 0.4 mg/dL (ref 0.0–1.2)
Total Protein: 6.7 g/dL (ref 6.5–8.1)

## 2023-08-16 LAB — VITAMIN B12: Vitamin B-12: 1016 pg/mL — ABNORMAL HIGH (ref 180–914)

## 2023-08-16 LAB — IRON AND IRON BINDING CAPACITY (CC-WL,HP ONLY)
Iron: 159 ug/dL (ref 45–182)
Saturation Ratios: 49 % — ABNORMAL HIGH (ref 17.9–39.5)
TIBC: 326 ug/dL (ref 250–450)
UIBC: 167 ug/dL (ref 117–376)

## 2023-08-16 LAB — FERRITIN: Ferritin: 1824 ng/mL — ABNORMAL HIGH (ref 24–336)

## 2023-08-16 MED ORDER — EPOETIN ALFA-EPBX 40000 UNIT/ML IJ SOLN
40000.0000 [IU] | Freq: Once | INTRAMUSCULAR | Status: AC
  Administered 2023-08-16: 40000 [IU] via SUBCUTANEOUS
  Filled 2023-08-16: qty 1

## 2023-08-16 MED ORDER — CLONIDINE HCL 0.1 MG PO TABS
0.1000 mg | ORAL_TABLET | Freq: Once | ORAL | Status: AC
  Administered 2023-08-16: 0.1 mg via ORAL
  Filled 2023-08-16: qty 1

## 2023-08-16 NOTE — Telephone Encounter (Signed)
 Pharmacy Patient Advocate Encounter  Received notification from [employer] that Prior Authorization for Testosterone  20.25 MG/ACT(1.62%) gel has been APPROVED from 1.1.25 to 12.31.25. Ran test claim, Copay is $10.00. This test claim was processed through [facility]- copay amounts may vary at other pharmacies due to pharmacy/plan contracts, or as the patient moves through the different stages of their insurance plan.   PA #/Case ID/Reference #: (Key: [id number])

## 2023-08-16 NOTE — Progress Notes (Signed)
 Patient here for retacrit  inj BP 189/81, retake 184/77. Salomon Cree MD notified. Clonidine 0.1 mg PO ordered, Bp retake sys still >180. Second dose Clonidine 0.1 mg PO ordered again. BP lowered down to 166/61. Retacrit  inj given.

## 2023-08-16 NOTE — Progress Notes (Deleted)
 Per Dr. Salomon Cree, "if he has taken his regular anti HTN and BP still>160 systolic on recheck and HR>60 can give clonidine 0.1mg  x 1 and proceed with retacrit ." Clonidine 0.1mg  ordered by RN. Will proceed with Retacrit .  Timothee Gali, PharmD, MBA

## 2023-08-16 NOTE — Progress Notes (Signed)
 Testosterone  is confirmed below.  His PSA is normal.  We should recheck his labs when he comes back in for his physical in a few weeks.

## 2023-08-25 ENCOUNTER — Other Ambulatory Visit: Payer: Self-pay | Admitting: Family Medicine

## 2023-08-30 ENCOUNTER — Encounter: Payer: Self-pay | Admitting: Hematology

## 2023-09-12 ENCOUNTER — Encounter: Payer: Self-pay | Admitting: Family Medicine

## 2023-09-12 ENCOUNTER — Ambulatory Visit (INDEPENDENT_AMBULATORY_CARE_PROVIDER_SITE_OTHER): Admitting: Family Medicine

## 2023-09-12 VITALS — BP 164/74 | HR 73 | Temp 97.3°F | Ht 76.5 in | Wt 253.2 lb

## 2023-09-12 DIAGNOSIS — R7989 Other specified abnormal findings of blood chemistry: Secondary | ICD-10-CM

## 2023-09-12 DIAGNOSIS — N2581 Secondary hyperparathyroidism of renal origin: Secondary | ICD-10-CM | POA: Diagnosis not present

## 2023-09-12 DIAGNOSIS — I152 Hypertension secondary to endocrine disorders: Secondary | ICD-10-CM

## 2023-09-12 DIAGNOSIS — E1122 Type 2 diabetes mellitus with diabetic chronic kidney disease: Secondary | ICD-10-CM | POA: Diagnosis not present

## 2023-09-12 DIAGNOSIS — D509 Iron deficiency anemia, unspecified: Secondary | ICD-10-CM | POA: Diagnosis not present

## 2023-09-12 DIAGNOSIS — N183 Chronic kidney disease, stage 3 unspecified: Secondary | ICD-10-CM | POA: Diagnosis not present

## 2023-09-12 DIAGNOSIS — E1169 Type 2 diabetes mellitus with other specified complication: Secondary | ICD-10-CM

## 2023-09-12 DIAGNOSIS — E785 Hyperlipidemia, unspecified: Secondary | ICD-10-CM | POA: Diagnosis not present

## 2023-09-12 DIAGNOSIS — Z0001 Encounter for general adult medical examination with abnormal findings: Secondary | ICD-10-CM | POA: Diagnosis not present

## 2023-09-12 DIAGNOSIS — E1159 Type 2 diabetes mellitus with other circulatory complications: Secondary | ICD-10-CM | POA: Diagnosis not present

## 2023-09-12 DIAGNOSIS — E538 Deficiency of other specified B group vitamins: Secondary | ICD-10-CM | POA: Diagnosis not present

## 2023-09-12 LAB — CBC
HCT: 29.6 % — ABNORMAL LOW (ref 39.0–52.0)
Hemoglobin: 9.8 g/dL — ABNORMAL LOW (ref 13.0–17.0)
MCHC: 33 g/dL (ref 30.0–36.0)
MCV: 106.7 fl — ABNORMAL HIGH (ref 78.0–100.0)
Platelets: 232 10*3/uL (ref 150.0–400.0)
RBC: 2.78 Mil/uL — ABNORMAL LOW (ref 4.22–5.81)
RDW: 15.6 % — ABNORMAL HIGH (ref 11.5–15.5)
WBC: 4.4 10*3/uL (ref 4.0–10.5)

## 2023-09-12 LAB — COMPREHENSIVE METABOLIC PANEL WITH GFR
ALT: 10 U/L (ref 0–53)
AST: 18 U/L (ref 0–37)
Albumin: 3.9 g/dL (ref 3.5–5.2)
Alkaline Phosphatase: 19 U/L — ABNORMAL LOW (ref 39–117)
BUN: 26 mg/dL — ABNORMAL HIGH (ref 6–23)
CO2: 27 meq/L (ref 19–32)
Calcium: 9.4 mg/dL (ref 8.4–10.5)
Chloride: 103 meq/L (ref 96–112)
Creatinine, Ser: 2.03 mg/dL — ABNORMAL HIGH (ref 0.40–1.50)
GFR: 31.97 mL/min — ABNORMAL LOW (ref >60.00)
Glucose, Bld: 120 mg/dL — ABNORMAL HIGH (ref 70–99)
Potassium: 5.2 meq/L — ABNORMAL HIGH (ref 3.5–5.1)
Sodium: 137 meq/L (ref 135–145)
Total Bilirubin: 0.5 mg/dL (ref 0.2–1.2)
Total Protein: 6.2 g/dL (ref 6.0–8.3)

## 2023-09-12 LAB — LIPID PANEL
Cholesterol: 150 mg/dL (ref 0–200)
HDL: 77 mg/dL (ref >39.00)
LDL Cholesterol: 57 mg/dL (ref 0–99)
NonHDL: 72.72
Total CHOL/HDL Ratio: 2
Triglycerides: 77 mg/dL (ref 0.0–149.0)
VLDL: 15.4 mg/dL (ref 0.0–40.0)

## 2023-09-12 LAB — HEMOGLOBIN A1C: Hgb A1c MFr Bld: 6.1 % (ref 4.6–6.5)

## 2023-09-12 LAB — IBC + FERRITIN
Ferritin: 1117.9 ng/mL — ABNORMAL HIGH (ref 22.0–322.0)
Iron: 117 ug/dL (ref 42–165)
Saturation Ratios: 39.6 % (ref 20.0–50.0)
TIBC: 295.4 ug/dL (ref 250.0–450.0)
Transferrin: 211 mg/dL — ABNORMAL LOW (ref 212.0–360.0)

## 2023-09-12 LAB — MICROALBUMIN / CREATININE URINE RATIO
Creatinine,U: 84 mg/dL
Microalb Creat Ratio: 29.5 mg/g (ref 0.0–30.0)
Microalb, Ur: 2.5 mg/dL — ABNORMAL HIGH (ref 0.0–1.9)

## 2023-09-12 LAB — TESTOSTERONE: Testosterone: 405.23 ng/dL (ref 300.00–890.00)

## 2023-09-12 LAB — PSA: PSA: 0.24 ng/mL (ref 0.10–4.00)

## 2023-09-12 LAB — VITAMIN B12: Vitamin B-12: 858 pg/mL (ref 211–911)

## 2023-09-12 LAB — TSH: TSH: 1.74 u[IU]/mL (ref 0.35–5.50)

## 2023-09-12 NOTE — Assessment & Plan Note (Signed)
 Check labs

## 2023-09-12 NOTE — Patient Instructions (Signed)
 It was very nice to see you today!  We will check blood work today.   Will probably need to change the dose of your testosterone  depending on the results of today's labs.  Please continue to work diet and exercise.  Will see back in year for your next physical.  Come back sooner if needed.  Return in about 1 year (around 09/11/2024) for Annual Physical.   Take care, Dr Daneil Dunker  PLEASE NOTE:  If you had any lab tests, please let us  know if you have not heard back within a few days. You may see your results on mychart before we have a chance to review them but we will give you a call once they are reviewed by us .   If we ordered any referrals today, please let us  know if you have not heard from their office within the next week.   If you had any urgent prescriptions sent in today, please check with the pharmacy within an hour of our visit to make sure the prescription was transmitted appropriately.   Please try these tips to maintain a healthy lifestyle:  Eat at least 3 REAL meals and 1-2 snacks per day.  Aim for no more than 5 hours between eating.  If you eat breakfast, please do so within one hour of getting up.   Each meal should contain half fruits/vegetables, one quarter protein, and one quarter carbs (no bigger than a computer mouse)  Cut down on sweet beverages. This includes juice, soda, and sweet tea.   Drink at least 1 glass of water with each meal and aim for at least 8 glasses per day  Exercise at least 150 minutes every week.    Preventive Care 63 Years and Older, Male Preventive care refers to lifestyle choices and visits with your health care provider that can promote health and wellness. Preventive care visits are also called wellness exams. What can I expect for my preventive care visit? Counseling During your preventive care visit, your health care provider may ask about your: Medical history, including: Past medical problems. Family medical history. History  of falls. Current health, including: Emotional well-being. Home life and relationship well-being. Sexual activity. Memory and ability to understand (cognition). Lifestyle, including: Alcohol, nicotine or tobacco, and drug use. Access to firearms. Diet, exercise, and sleep habits. Work and work Astronomer. Sunscreen use. Safety issues such as seatbelt and bike helmet use. Physical exam Your health care provider will check your: Height and weight. These may be used to calculate your BMI (body mass index). BMI is a measurement that tells if you are at a healthy weight. Waist circumference. This measures the distance around your waistline. This measurement also tells if you are at a healthy weight and may help predict your risk of certain diseases, such as type 2 diabetes and high blood pressure. Heart rate and blood pressure. Body temperature. Skin for abnormal spots. What immunizations do I need?  Vaccines are usually given at various ages, according to a schedule. Your health care provider will recommend vaccines for you based on your age, medical history, and lifestyle or other factors, such as travel or where you work. What tests do I need? Screening Your health care provider may recommend screening tests for certain conditions. This may include: Lipid and cholesterol levels. Diabetes screening. This is done by checking your blood sugar (glucose) after you have not eaten for a while (fasting). Hepatitis C test. Hepatitis B test. HIV (human immunodeficiency virus) test. STI (sexually  transmitted infection) testing, if you are at risk. Lung cancer screening. Colorectal cancer screening. Prostate cancer screening. Abdominal aortic aneurysm (AAA) screening. You may need this if you are a current or former smoker. Talk with your health care provider about your test results, treatment options, and if necessary, the need for more tests. Follow these instructions at home: Eating and  drinking  Eat a diet that includes fresh fruits and vegetables, whole grains, lean protein, and low-fat dairy products. Limit your intake of foods with high amounts of sugar, saturated fats, and salt. Take vitamin and mineral supplements as recommended by your health care provider. Do not drink alcohol if your health care provider tells you not to drink. If you drink alcohol: Limit how much you have to 0-2 drinks a day. Know how much alcohol is in your drink. In the [place] one drink equals one 12 oz bottle of beer (355 mL), one 5 oz glass of wine (148 mL), or one 1 oz glass of hard liquor (44 mL). Lifestyle Brush your teeth every morning and night with fluoride toothpaste. Floss one time each day. Exercise for at least 30 minutes 5 or more days each week. Do not use any products that contain nicotine or tobacco. These products include cigarettes, chewing tobacco, and vaping devices, such as e-cigarettes. If you need help quitting, ask your health care provider. Do not use drugs. If you are sexually active, practice safe sex. Use a condom or other form of protection to prevent STIs. Take aspirin  only as told by your health care provider. Make sure that you understand how much to take and what form to take. Work with your health care provider to find out whether it is safe and beneficial for you to take aspirin  daily. Ask your health care provider if you need to take a cholesterol-lowering medicine (statin). Find healthy ways to manage stress, such as: Meditation, yoga, or listening to music. Journaling. Talking to a trusted person. Spending time with friends and family. Safety Always wear your seat belt while driving or riding in a vehicle. Do not drive: If you have been drinking alcohol. Do not ride with someone who has been drinking. When you are tired or distracted. While texting. If you have been using any mind-altering substances or drugs. Wear a helmet and other protective equipment  during sports activities. If you have firearms in your house, make sure you follow all gun safety procedures. Minimize exposure to UV radiation to reduce your risk of skin cancer. What's next? Visit your health care provider once a year for an annual wellness visit. Ask your health care provider how often you should have your eyes and teeth checked. Stay up to date on all vaccines. This information is not intended to replace advice given to you by your health care provider. Make sure you discuss any questions you have with your health care provider. Document Revised: 09/09/2020 Document Reviewed: 09/09/2020 Elsevier Patient Education  2024 [employer].

## 2023-09-12 NOTE — Assessment & Plan Note (Signed)
 Elevated today though typically well-controlled.  Initially 140/72.  We will continue current regimen Coreg  3.125 mg twice daily and HCTZ 25 mg daily.  He will monitor at home and let us  know if persistently elevated.

## 2023-09-12 NOTE — Assessment & Plan Note (Signed)
 Check B12

## 2023-09-12 NOTE — Assessment & Plan Note (Signed)
 Check lipids.  On Vytorin  and fenofibrate .

## 2023-09-12 NOTE — Assessment & Plan Note (Signed)
 Recently started testosterone  supplementation with 1 pump of AndroGel  daily.  He has not noticed much change in symptoms.  Will recheck testosterone  level today and adjust dose as needed.

## 2023-09-12 NOTE — Progress Notes (Signed)
 Chief Complaint:  Mitchell Alvarado is a 73 y.o. male who presents today for his annual comprehensive physical exam.    Assessment/Plan:  Chronic Problems Addressed Today: Diabetes (HCC) Check A1c. Not currently on any medications.   Dyslipidemia and hypertriglyceridemia associated with type 2 diabetes mellitus (HCC) Check lipids.  On Vytorin  and fenofibrate .   Iron deficiency anemia Follows with heme-onc.  Check labs today.  Hypertension associated with diabetes (HCC) Elevated today though typically well-controlled.  Initially 140/72.  We will continue current regimen Coreg  3.125 mg twice daily and HCTZ 25 mg daily.  He will monitor at home and let us  know if persistently elevated.  CKD stage 3 secondary to diabetes [facility]) Check labs.  Hyperparathyroidism, secondary (HCC) On calcitriol  0.25 mg 3 times weekly.  Will check labs today including calcium and PTH.  Vitamin B12 deficiency Check B12.  Low testosterone  Recently started testosterone  supplementation with 1 pump of AndroGel  daily.  He has not noticed much change in symptoms.  Will recheck testosterone  level today and adjust dose as needed.  Preventative Healthcare: Check labs. Up to date on vaccines. Due for colonoscopy later this year.   Patient Counseling(The following topics were reviewed and/or handout was given):  -Nutrition: Stressed importance of moderation in sodium/caffeine intake, saturated fat and cholesterol, caloric balance, sufficient intake of fresh fruits, vegetables, and fiber.  -Stressed the importance of regular exercise.   -Substance Abuse: Discussed cessation/primary prevention of tobacco, alcohol, or other drug use; driving or other dangerous activities under the influence; availability of treatment for abuse.   -Injury prevention: Discussed safety belts, safety helmets, smoke detector, smoking near bedding or upholstery.   -Sexuality: Discussed sexually transmitted diseases, partner selection, use  of condoms, avoidance of unintended pregnancy and contraceptive alternatives.   -Dental health: Discussed importance of regular tooth brushing, flossing, and dental visits.  -Health maintenance and immunizations reviewed. Please refer to Health maintenance section.  Return to care in 1 year for next preventative visit.     Subjective:  HPI:  He has no acute complaints today.  He is doing well today.  Last saw him a few weeks ago and we started him on testosterone  replacement.  Has been on this for a couple weeks though has not noticed much of a difference as of yet.  Lifestyle Diet: Trying to cut down on sweets and carbohydrates.  Exercise: None specific.      09/12/2023   12:55 PM  Depression screen PHQ 2/9  Decreased Interest 0  Down, Depressed, Hopeless 0  PHQ - 2 Score 0    Health Maintenance Due  Topic Date Due   FOOT EXAM  06/23/2020   HEMOGLOBIN A1C  01/06/2023   Diabetic kidney evaluation - Urine ACR  07/07/2023     ROS: Per HPI, otherwise a complete review of systems was negative.   PMH:  The following were reviewed and entered/updated in epic: Past Medical History:  Diagnosis Date   Allergy    ANEMIA, IRON DEFICIENCY 12/23/2009   Barrett's esophagus without dysplasia 05/2012   DIABETES MELLITUS, TYPE II 12/30/2006   ECZEMA 05/15/2009   GERD 12/30/2006   HYPERLIPIDEMIA 12/30/2006   HYPERTENSION 12/30/2006   HYPERTHYROIDISM 06/03/2008   Pernicious anemia 12/30/2006   Personal history of colonic polyps - adenoma 07/22/2004   Patient Active Problem List   Diagnosis Date Noted   MDS (myelodysplastic syndrome) (HCC) 04/24/2023   Anemia in chronic kidney disease 04/24/2023   Low testosterone  07/07/2022   Vitamin B12 deficiency  06/25/2020   Hyperparathyroidism, secondary (HCC) 05/27/2016   CKD stage 3 secondary to diabetes (HCC) 05/21/2012   Iron deficiency anemia 12/23/2009   ECZEMA 05/15/2009   Diabetes (HCC) 12/30/2006   Dyslipidemia and  hypertriglyceridemia associated with type 2 diabetes mellitus (HCC) 12/30/2006   Hypertension associated with diabetes (HCC) 12/30/2006   GERD 12/30/2006   History of colonic polyps 07/22/2004   Barrett's esophagus -less than 1 cm, does not meet current criteria for this diagnosis 01/24/2002   Past Surgical History:  Procedure Laterality Date   COLONOSCOPY  multiple   COLONOSCOPY WITH PROPOFOL   07/27/2015   Dr.Gessner   ESOPHAGOGASTRODUODENOSCOPY  multiple   POLYPECTOMY     TRANSURETHRAL RESECTION OF PROSTATE  03/28/1998    Family History  Problem Relation Age of Onset   Heart attack Father        MI   Cancer Neg Hx    Colon cancer Neg Hx    Esophageal cancer Neg Hx    Stomach cancer Neg Hx     Medications- reviewed and updated Current Outpatient Medications  Medication Sig Dispense Refill   aspirin  81 MG tablet Take 1 tablet (81 mg total) by mouth daily. 90 tablet 3   calcitRIOL  (ROCALTROL ) 0.25 MCG capsule Take 1 capsule (0.25 mcg total) by mouth every Monday, Wednesday, and Friday. 36 capsule 0   carvedilol  (COREG ) 3.125 MG tablet TAKE 1 TAB BY MOUTH 2 TIMES DAILY. PLEASE CALL [phone] TO SCHEDULE YOUR PHYSICAL IN APRIL FOR FURTHER REFILLS 180 tablet 1   Cholecalciferol (VITAMIN D ) 2000 units CAPS Take 2,000 Units by mouth daily.     ezetimibe -simvastatin  (VYTORIN ) 10-80 MG tablet TAKE 1 TABLET BY MOUTH EVERY DAY 90 tablet 1   fenofibrate  (TRICOR ) 145 MG tablet TAKE 1 TABLET BY MOUTH EVERY DAY 90 tablet 0   glucose blood (ONETOUCH VERIO) test strip 1 each by Other route daily. And lancets 1/day 100 each 3   hydrochlorothiazide  (HYDRODIURIL ) 25 MG tablet TAKE 1 TABLET BY MOUTH EVERY DAY 90 tablet 0   latanoprost (XALATAN) 0.005 % ophthalmic solution Place 1 drop into both eyes at bedtime.     omeprazole  (PRILOSEC ) 20 MG capsule TAKE 1 CAPSULE BY MOUTH EVERY DAY 90 capsule 3   Testosterone  20.25 MG/ACT (1.62%) GEL Place 1 Pump onto the skin daily in the afternoon. 75 g 0    vitamin B-12 (CYANOCOBALAMIN ) 1000 MCG tablet Take 1,000 mcg by mouth daily.     No current facility-administered medications for this visit.    Allergies-reviewed and updated No Known Allergies  Social History   Socioeconomic History   Marital status: Legally Separated    Spouse name: Not on file   Number of children: Not on file   Years of education: Not on file   Highest education level: Bachelor's degree (e.g., BA, AB, BS)  Occupational History   Occupation: Retired from school system  Tobacco Use   Smoking status: Former   Smokeless tobacco: Never  Vaping Use   Vaping status: Never Used  Substance and Sexual Activity   Alcohol use: Yes    Alcohol/week: 7.0 - 10.0 standard drinks of alcohol    Types: 7 - 10 Glasses of wine per week    Comment: 7-10 glasses wine weekly   Drug use: No   Sexual activity: Not on file  Other Topics Concern   Not on file  Social History Narrative   Married   Retired Merchandiser, 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  [employer]   7 drinks/week   Former smoker   No drug use   Social Drivers of Corporate investment banker Strain: Low Risk  (09/08/2023)   Overall Financial Resource Strain (CARDIA)    Difficulty of Paying Living Expenses: Not hard at all  Food Insecurity: No Food Insecurity (09/08/2023)   Hunger Vital Sign    Worried About Running Out of Food in the Last Year: Never true    Ran Out of Food in the Last Year: Never true  Transportation Needs: No Transportation Needs (09/08/2023)   PRAPARE - Administrator, Civil Service (Medical): No    Lack of Transportation (Non-Medical): No  Physical Activity: Inactive (09/08/2023)   Exercise Vital Sign    Days of Exercise per Week: 0 days    Minutes of Exercise per Session: Not on file  Stress: No Stress Concern Present (09/08/2023)   [employer] of Occupational Health - Occupational Stress Questionnaire    Feeling of Stress: Not at all  Social  Connections: Moderately Isolated (09/08/2023)   Social Connection and Isolation Panel    Frequency of Communication with Friends and Family: Once a week    Frequency of Social Gatherings with Friends and Family: Once a week    Attends Religious Services: 1 to 4 times per year    Active Member of [employer] or Organizations: Yes    Attends Banker Meetings: 1 to 4 times per year    Marital Status: Separated        Objective:  Physical Exam: BP (!) 164/74   Pulse 73   Temp (!) 97.3 F (36.3 C) (Temporal)   Ht 6' 4.5 (1.943 m)   Wt 253 lb 3.2 oz (114.9 kg)   SpO2 100%   BMI 30.42 kg/m   Body mass index is 30.42 kg/m. Wt Readings from Last 3 Encounters:  09/12/23 253 lb 3.2 oz (114.9 kg)  08/14/23 248 lb 9.6 oz (112.8 kg)  07/18/23 248 lb 11.2 oz (112.8 kg)   Gen: NAD, resting comfortably HEENT: TMs normal bilaterally. OP clear. No thyromegaly noted.  CV: RRR with no murmurs appreciated Pulm: NWOB, CTAB with no crackles, wheezes, or rhonchi GI: Normal bowel sounds present. Soft, Nontender, Nondistended. MSK: no edema, cyanosis, or clubbing noted Skin: warm, dry Neuro: CN2-12 grossly intact. Strength 5/5 in upper and lower extremities. Reflexes symmetric and intact bilaterally.  Psych: Normal affect and thought content     Constantina Laseter M. Daneil Dunker, MD 09/12/2023 1:19 PM

## 2023-09-12 NOTE — Assessment & Plan Note (Signed)
Check A1c.  Not currently on any medications. 

## 2023-09-12 NOTE — Assessment & Plan Note (Signed)
 On calcitriol  0.25 mg 3 times weekly.  Will check labs today including calcium and PTH.

## 2023-09-12 NOTE — Assessment & Plan Note (Signed)
 Follows with heme-onc.  Check labs today.

## 2023-09-13 ENCOUNTER — Inpatient Hospital Stay

## 2023-09-13 ENCOUNTER — Inpatient Hospital Stay: Attending: Hematology

## 2023-09-13 VITALS — BP 152/59 | HR 85 | Temp 98.5°F | Resp 20

## 2023-09-13 DIAGNOSIS — D631 Anemia in chronic kidney disease: Secondary | ICD-10-CM | POA: Insufficient documentation

## 2023-09-13 DIAGNOSIS — N183 Chronic kidney disease, stage 3 unspecified: Secondary | ICD-10-CM | POA: Diagnosis not present

## 2023-09-13 DIAGNOSIS — D469 Myelodysplastic syndrome, unspecified: Secondary | ICD-10-CM

## 2023-09-13 LAB — PTH, INTACT AND CALCIUM
Calcium: 9.5 mg/dL (ref 8.6–10.3)
PTH: 33 pg/mL (ref 16–77)

## 2023-09-13 MED ORDER — EPOETIN ALFA-EPBX 40000 UNIT/ML IJ SOLN
40000.0000 [IU] | Freq: Once | INTRAMUSCULAR | Status: AC
  Administered 2023-09-13: 40000 [IU] via SUBCUTANEOUS
  Filled 2023-09-13: qty 1

## 2023-09-14 ENCOUNTER — Ambulatory Visit: Payer: Self-pay | Admitting: Family Medicine

## 2023-09-14 NOTE — Progress Notes (Signed)
 His labs are all stable.  His anemia is at baseline.  His kidney function is at baseline also.  His A1c is borderline but stable to last few years.  Do not need to change meds but he should continue to work on diet and exercise and we can recheck this in 6 months.  Testosterone  level is improving and is now at goal.  Recommend that he continue with his current dose and follow-up with us  in a few weeks if he has not had any improvement in symptoms.

## 2023-09-17 ENCOUNTER — Other Ambulatory Visit: Payer: Self-pay | Admitting: Family Medicine

## 2023-10-04 ENCOUNTER — Encounter: Payer: Self-pay | Admitting: Hematology

## 2023-10-11 ENCOUNTER — Encounter: Payer: Self-pay | Admitting: Hematology

## 2023-10-11 ENCOUNTER — Other Ambulatory Visit: Payer: Self-pay | Admitting: Physician Assistant

## 2023-10-11 ENCOUNTER — Inpatient Hospital Stay

## 2023-10-11 ENCOUNTER — Inpatient Hospital Stay: Attending: Hematology | Admitting: Physician Assistant

## 2023-10-11 VITALS — BP 136/47 | HR 79 | Temp 97.7°F | Resp 18 | Wt 253.3 lb

## 2023-10-11 DIAGNOSIS — D631 Anemia in chronic kidney disease: Secondary | ICD-10-CM | POA: Diagnosis not present

## 2023-10-11 DIAGNOSIS — D469 Myelodysplastic syndrome, unspecified: Secondary | ICD-10-CM

## 2023-10-11 DIAGNOSIS — N189 Chronic kidney disease, unspecified: Secondary | ICD-10-CM

## 2023-10-11 DIAGNOSIS — N183 Chronic kidney disease, stage 3 unspecified: Secondary | ICD-10-CM | POA: Insufficient documentation

## 2023-10-11 DIAGNOSIS — E1122 Type 2 diabetes mellitus with diabetic chronic kidney disease: Secondary | ICD-10-CM | POA: Diagnosis not present

## 2023-10-11 LAB — CBC WITH DIFFERENTIAL (CANCER CENTER ONLY)
Abs Immature Granulocytes: 0.01 K/uL (ref 0.00–0.07)
Basophils Absolute: 0.1 K/uL (ref 0.0–0.1)
Basophils Relative: 1 %
Eosinophils Absolute: 0.1 K/uL (ref 0.0–0.5)
Eosinophils Relative: 3 %
HCT: 29.7 % — ABNORMAL LOW (ref 39.0–52.0)
Hemoglobin: 10.2 g/dL — ABNORMAL LOW (ref 13.0–17.0)
Immature Granulocytes: 0 %
Lymphocytes Relative: 36 %
Lymphs Abs: 1.8 K/uL (ref 0.7–4.0)
MCH: 34 pg (ref 26.0–34.0)
MCHC: 34.3 g/dL (ref 30.0–36.0)
MCV: 99 fL (ref 80.0–100.0)
Monocytes Absolute: 0.5 K/uL (ref 0.1–1.0)
Monocytes Relative: 10 %
Neutro Abs: 2.5 K/uL (ref 1.7–7.7)
Neutrophils Relative %: 50 %
Platelet Count: 201 K/uL (ref 150–400)
RBC: 3 MIL/uL — ABNORMAL LOW (ref 4.22–5.81)
RDW: 14.1 % (ref 11.5–15.5)
WBC Count: 5 K/uL (ref 4.0–10.5)
nRBC: 0 % (ref 0.0–0.2)

## 2023-10-11 LAB — IRON AND IRON BINDING CAPACITY (CC-WL,HP ONLY)
Iron: 150 ug/dL (ref 45–182)
Saturation Ratios: 56 % — ABNORMAL HIGH (ref 17.9–39.5)
TIBC: 266 ug/dL (ref 250–450)
UIBC: 116 ug/dL — ABNORMAL LOW (ref 117–376)

## 2023-10-11 LAB — CMP (CANCER CENTER ONLY)
ALT: 14 U/L (ref 0–44)
AST: 20 U/L (ref 15–41)
Albumin: 3.8 g/dL (ref 3.5–5.0)
Alkaline Phosphatase: 22 U/L — ABNORMAL LOW (ref 38–126)
Anion gap: 7 (ref 5–15)
BUN: 27 mg/dL — ABNORMAL HIGH (ref 8–23)
CO2: 25 mmol/L (ref 22–32)
Calcium: 9.4 mg/dL (ref 8.9–10.3)
Chloride: 107 mmol/L (ref 98–111)
Creatinine: 2.01 mg/dL — ABNORMAL HIGH (ref 0.61–1.24)
GFR, Estimated: 34 mL/min — ABNORMAL LOW (ref >60)
Glucose, Bld: 133 mg/dL — ABNORMAL HIGH (ref 70–99)
Potassium: 4.7 mmol/L (ref 3.5–5.1)
Sodium: 139 mmol/L (ref 135–145)
Total Bilirubin: 0.6 mg/dL (ref 0.0–1.2)
Total Protein: 6.4 g/dL — ABNORMAL LOW (ref 6.5–8.1)

## 2023-10-11 LAB — FERRITIN: Ferritin: 1930 ng/mL — ABNORMAL HIGH (ref 24–336)

## 2023-10-11 LAB — VITAMIN B12: Vitamin B-12: 1323 pg/mL — ABNORMAL HIGH (ref 180–914)

## 2023-10-11 MED ORDER — EPOETIN ALFA-EPBX 40000 UNIT/ML IJ SOLN
40000.0000 [IU] | Freq: Once | INTRAMUSCULAR | Status: AC
  Administered 2023-10-11: 40000 [IU] via SUBCUTANEOUS
  Filled 2023-10-11: qty 1

## 2023-10-12 ENCOUNTER — Other Ambulatory Visit: Payer: Self-pay | Admitting: Family Medicine

## 2023-10-12 ENCOUNTER — Encounter: Payer: Self-pay | Admitting: Hematology

## 2023-10-12 NOTE — Progress Notes (Signed)
 HEMATOLOGY/ONCOLOGY CLINIC NOTE  Date of Service: 10/11/23     Patient Care Team: Kennyth Worth HERO, MD as PCP - General (Family Medicine) Leslee Reusing, MD as Attending Physician (Ophthalmology) Alline Lenis, MD (Inactive) as Attending Physician (Urology) Douglass Lunger, MD as Consulting Physician (Nephrology) Dow Maxwell, PT as Physical Therapist (Physical Therapy)  CHIEF COMPLAINTS/PURPOSE OF CONSULTATION:  Follow-up for multifactorial anemia  Interval History:   Mitchell Alvarado is here for continued evaluation and management of his multifactorial anemia. He is due for another retacrit  injection. He reports improvement of his fatigue since starting retacrit  injection. His appetite and weight are stable. He denies nausea, vomiting or bowel habit changes. He denies easy bruising or signs of bleeding. He denies fevers, chills, sweats, shortness of breath, chest pain or cough. He has no other complaints.   MEDICAL HISTORY:  Past Medical History:  Diagnosis Date   Allergy    ANEMIA, IRON DEFICIENCY 12/23/2009   Barrett's esophagus without dysplasia 05/2012   DIABETES MELLITUS, TYPE II 12/30/2006   ECZEMA 05/15/2009   GERD 12/30/2006   HYPERLIPIDEMIA 12/30/2006   HYPERTENSION 12/30/2006   HYPERTHYROIDISM 06/03/2008   Pernicious anemia 12/30/2006   Personal history of colonic polyps - adenoma 07/22/2004    SURGICAL HISTORY: Past Surgical History:  Procedure Laterality Date   COLONOSCOPY  multiple   COLONOSCOPY WITH PROPOFOL   07/27/2015   Dr.Gessner   ESOPHAGOGASTRODUODENOSCOPY  multiple   POLYPECTOMY     TRANSURETHRAL RESECTION OF PROSTATE  03/28/1998   SOCIAL HISTORY: Social History   Socioeconomic History   Marital status: Legally Separated    Spouse name: Not on file   Number of children: Not on file   Years of education: Not on file   Highest education level: Bachelor's degree (e.g., BA, AB, BS)  Occupational History   Occupation: Retired  from school system  Tobacco Use   Smoking status: Former   Smokeless tobacco: Never  Vaping Use   Vaping status: Never Used  Substance and Sexual Activity   Alcohol use: Yes    Alcohol/week: 7.0 - 10.0 standard drinks of alcohol    Types: 7 - 10 Glasses of wine per week    Comment: 7-10 glasses wine weekly   Drug use: No   Sexual activity: Not on file  Other Topics Concern   Not on file  Social History Narrative   Married   Retired Merchandiser, 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, hospital [employer]   7 drinks/week   Former smoker   No drug use   Social Drivers of Corporate investment banker Strain: Low Risk  (09/08/2023)   Overall Financial Resource Strain (CARDIA)    Difficulty of Paying Living Expenses: Not hard at all  Food Insecurity: No Food Insecurity (09/08/2023)   Hunger Vital Sign    Worried About Running Out of Food in the Last Year: Never true    Ran Out of Food in the Last Year: Never true  Transportation Needs: No Transportation Needs (09/08/2023)   PRAPARE - Administrator, Civil Service (Medical): No    Lack of Transportation (Non-Medical): No  Physical Activity: Inactive (09/08/2023)   Exercise Vital Sign    Days of Exercise per Week: 0 days    Minutes of Exercise per Session: Not on file  Stress: No Stress Concern Present (09/08/2023)   [employer] of Occupational Health - Occupational Stress Questionnaire    Feeling of Stress: Not at all  Social Connections: Moderately Isolated (09/08/2023)   Social Connection and Isolation Panel    Frequency of Communication with Friends and Family: Once a week    Frequency of Social Gatherings with Friends and Family: Once a week    Attends Religious Services: 1 to 4 times per year    Active Member of [employer] or Organizations: Yes    Attends Banker Meetings: 1 to 4 times per year    Marital Status: Separated  Intimate Partner Violence: Not At Risk (01/25/2023)   Humiliation,  Afraid, Rape, and Kick questionnaire    Fear of Current or Ex-Partner: No    Emotionally Abused: No    Physically Abused: No    Sexually Abused: No    FAMILY HISTORY: Family History  Problem Relation Age of Onset   Heart attack Father        MI   Cancer Neg Hx    Colon cancer Neg Hx    Esophageal cancer Neg Hx    Stomach cancer Neg Hx     ALLERGIES:  has no known allergies.  MEDICATIONS:  Current Outpatient Medications  Medication Sig Dispense Refill   aspirin  81 MG tablet Take 1 tablet (81 mg total) by mouth daily. 90 tablet 3   calcitRIOL  (ROCALTROL ) 0.25 MCG capsule Take 1 capsule (0.25 mcg total) by mouth every Monday, Wednesday, and Friday. 36 capsule 0   carvedilol  (COREG ) 3.125 MG tablet TAKE 1 TAB BY MOUTH 2 TIMES DAILY. PLEASE CALL [phone] TO SCHEDULE YOUR PHYSICAL IN APRIL FOR FURTHER REFILLS 180 tablet 1   Cholecalciferol (VITAMIN D ) 2000 units CAPS Take 2,000 Units by mouth daily.     ezetimibe -simvastatin  (VYTORIN ) 10-80 MG tablet TAKE 1 TABLET BY MOUTH EVERY DAY 90 tablet 1   fenofibrate  (TRICOR ) 145 MG tablet TAKE 1 TABLET BY MOUTH EVERY DAY 90 tablet 0   glucose blood (ONETOUCH VERIO) test strip 1 each by Other route daily. And lancets 1/day 100 each 3   hydrochlorothiazide  (HYDRODIURIL ) 25 MG tablet TAKE 1 TABLET BY MOUTH EVERY DAY 90 tablet 0   latanoprost (XALATAN) 0.005 % ophthalmic solution Place 1 drop into both eyes at bedtime.     omeprazole  (PRILOSEC ) 20 MG capsule TAKE 1 CAPSULE BY MOUTH EVERY DAY 90 capsule 3   Testosterone  20.25 MG/ACT (1.62%) GEL PLACE 1 PUMP ONTO THE SKIN DAILY IN THE AFTERNOON. 75 g 0   vitamin B-12 (CYANOCOBALAMIN ) 1000 MCG tablet Take 1,000 mcg by mouth daily.     No current facility-administered medications for this visit.    REVIEW OF SYSTEMS:   10 Point review of Systems was done is negative except as noted above.  PHYSICAL EXAMINATION:  . Vitals:   10/11/23 1447  BP: (!) 136/47  Pulse: 79  Resp: 18  Temp:  97.7 F (36.5 C)  SpO2: 98%   Filed Weights   10/11/23 1447  Weight: 253 lb 4.8 oz (114.9 kg)  .Body mass index is 30.43 kg/m.  SABRA GENERAL:alert, in no acute distress and comfortable SKIN: no acute rashes, no significant lesions EYES: conjunctiva are pink and non-injected, sclera anicteric LUNGS: clear to auscultation b/l with normal respiratory effort HEART: regular rate & rhythm Extremity: no pedal edema PSYCH: alert & oriented x 3 with fluent speech NEURO: no focal motor/sensory deficits   LABORATORY DATA:  I have reviewed the data as listed .    Latest Ref Rng & Units 10/11/2023    2:41 PM 09/12/2023    1:19 PM 08/16/2023  1:57 PM  CBC  WBC 4.0 - 10.5 K/uL 5.0  4.4  4.6   Hemoglobin 13.0 - 17.0 g/dL 89.7  9.8  9.4   Hematocrit 39.0 - 52.0 % 29.7  29.6  27.0   Platelets 150 - 400 K/uL 201  232.0  227    .CBC    Component Value Date/Time   WBC 5.0 10/11/2023 1441   WBC 4.4 09/12/2023 1319   RBC 3.00 (L) 10/11/2023 1441   HGB 10.2 (L) 10/11/2023 1441   HCT 29.7 (L) 10/11/2023 1441   HCT 31.1 (L) 09/07/2017 1405   PLT 201 10/11/2023 1441   MCV 99.0 10/11/2023 1441   MCH 34.0 10/11/2023 1441   MCHC 34.3 10/11/2023 1441   RDW 14.1 10/11/2023 1441   LYMPHSABS 1.8 10/11/2023 1441   MONOABS 0.5 10/11/2023 1441   EOSABS 0.1 10/11/2023 1441   BASOSABS 0.1 10/11/2023 1441    .    Latest Ref Rng & Units 10/11/2023    2:41 PM 09/12/2023    1:19 PM 08/16/2023    1:57 PM  CMP  Glucose 70 - 99 mg/dL 866  879  882   BUN 8 - 23 mg/dL 27  26  35   Creatinine 0.61 - 1.24 mg/dL 7.98  7.96  7.90   Sodium 135 - 145 mmol/L 139  137  139   Potassium 3.5 - 5.1 mmol/L 4.7  5.2 No hemolysis seen  4.1   Chloride 98 - 111 mmol/L 107  103  108   CO2 22 - 32 mmol/L 25  27  21    Calcium 8.9 - 10.3 mg/dL 9.4  9.4    9.5  9.6   Total Protein 6.5 - 8.1 g/dL 6.4  6.2  6.7   Total Bilirubin 0.0 - 1.2 mg/dL 0.6  0.5  0.4   Alkaline Phos 38 - 126 U/L 22  19  21    AST 15 - 41 U/L 20  18   23    ALT 0 - 44 U/L 14  10  14     . Lab Results  Component Value Date   IRON 150 10/11/2023   TIBC 266 10/11/2023   IRONPCTSAT 56 (H) 10/11/2023   (Iron and TIBC)  Lab Results  Component Value Date   FERRITIN 1,930 (H) 10/11/2023   ]   RADIOGRAPHIC STUDIES: I have personally reviewed the radiological images as listed and agreed with the findings in the report. No results found.   ASSESSMENT & PLAN:  Mitchell Alvarado is a 73 y.o. male who presents for continued management of anemia.   #Anemia: #B12 deficiency -- multifactorial due to pernicious anemia with anti IF antibodies + Anemia from CKD + cannot r/o an element of low grade MDS  #Elevated ferritin levels --Patient previously had Barrett's esophagus and iron deficiency anemia.  Currently his ferritin is remained elevated likely in the context of significant previous iron replacement. --Hold off on any iron supplementation. --HFE gene mutation study is negative for any genetic findings suggestive of hemochromatosis.  PLAN: -Reviewed labs from today. Anemia has improved to 10.2. No other cytopenias. Creatinine stable at 2.01.  No evidence of iron deficiency.  -Continue with Retacrit  injection for today.  -Goal will be to keep hgb about 10.0 g/dL.  -Continue Vitamin B-12 and B-complex supplement.   FOLLOW-UP: 4 weeks: labs and and retacrit  injection 8 weeks: labs and retacrit  injection 12 weeks: labs, follow up and retacrit  injection.   All of the patient's questions  were answered with apparent satisfaction. The patient knows to call the clinic with any problems, questions or concerns.   I have spent a total of 25 minutes minutes of face-to-face and non-face-to-face time, preparing to see the patient, performing a medically appropriate examination, counseling and educating the patient,  documenting clinical information in the electronic health record, independently interpreting results and communicating results to  the patient, and care coordination.   Johnston Police PA-C Dept of Hematology and Oncology [facility] Phone: [phone]

## 2023-11-04 ENCOUNTER — Other Ambulatory Visit: Payer: Self-pay | Admitting: Family Medicine

## 2023-11-07 ENCOUNTER — Other Ambulatory Visit: Payer: Self-pay

## 2023-11-07 DIAGNOSIS — N189 Chronic kidney disease, unspecified: Secondary | ICD-10-CM

## 2023-11-07 DIAGNOSIS — D631 Anemia in chronic kidney disease: Secondary | ICD-10-CM

## 2023-11-07 DIAGNOSIS — D469 Myelodysplastic syndrome, unspecified: Secondary | ICD-10-CM

## 2023-11-08 ENCOUNTER — Inpatient Hospital Stay: Attending: Hematology

## 2023-11-08 ENCOUNTER — Inpatient Hospital Stay

## 2023-11-08 VITALS — BP 159/62 | HR 73 | Temp 98.3°F | Resp 17

## 2023-11-08 DIAGNOSIS — D469 Myelodysplastic syndrome, unspecified: Secondary | ICD-10-CM

## 2023-11-08 DIAGNOSIS — E538 Deficiency of other specified B group vitamins: Secondary | ICD-10-CM | POA: Insufficient documentation

## 2023-11-08 DIAGNOSIS — N183 Chronic kidney disease, stage 3 unspecified: Secondary | ICD-10-CM | POA: Diagnosis not present

## 2023-11-08 DIAGNOSIS — E1122 Type 2 diabetes mellitus with diabetic chronic kidney disease: Secondary | ICD-10-CM | POA: Diagnosis not present

## 2023-11-08 DIAGNOSIS — D631 Anemia in chronic kidney disease: Secondary | ICD-10-CM | POA: Diagnosis not present

## 2023-11-08 LAB — CMP (CANCER CENTER ONLY)
ALT: 16 U/L (ref 0–44)
AST: 23 U/L (ref 15–41)
Albumin: 4 g/dL (ref 3.5–5.0)
Alkaline Phosphatase: 23 U/L — ABNORMAL LOW (ref 38–126)
Anion gap: 7 (ref 5–15)
BUN: 34 mg/dL — ABNORMAL HIGH (ref 8–23)
CO2: 25 mmol/L (ref 22–32)
Calcium: 9.7 mg/dL (ref 8.9–10.3)
Chloride: 105 mmol/L (ref 98–111)
Creatinine: 2.25 mg/dL — ABNORMAL HIGH (ref 0.61–1.24)
GFR, Estimated: 30 mL/min — ABNORMAL LOW (ref >60)
Glucose, Bld: 126 mg/dL — ABNORMAL HIGH (ref 70–99)
Potassium: 4.5 mmol/L (ref 3.5–5.1)
Sodium: 137 mmol/L (ref 135–145)
Total Bilirubin: 0.7 mg/dL (ref 0.0–1.2)
Total Protein: 6.5 g/dL (ref 6.5–8.1)

## 2023-11-08 LAB — CBC WITH DIFFERENTIAL (CANCER CENTER ONLY)
Abs Immature Granulocytes: 0.01 K/uL (ref 0.00–0.07)
Basophils Absolute: 0.1 K/uL (ref 0.0–0.1)
Basophils Relative: 1 %
Eosinophils Absolute: 0.2 K/uL (ref 0.0–0.5)
Eosinophils Relative: 3 %
HCT: 28.8 % — ABNORMAL LOW (ref 39.0–52.0)
Hemoglobin: 9.9 g/dL — ABNORMAL LOW (ref 13.0–17.0)
Immature Granulocytes: 0 %
Lymphocytes Relative: 34 %
Lymphs Abs: 1.7 K/uL (ref 0.7–4.0)
MCH: 33.8 pg (ref 26.0–34.0)
MCHC: 34.4 g/dL (ref 30.0–36.0)
MCV: 98.3 fL (ref 80.0–100.0)
Monocytes Absolute: 0.5 K/uL (ref 0.1–1.0)
Monocytes Relative: 10 %
Neutro Abs: 2.5 K/uL (ref 1.7–7.7)
Neutrophils Relative %: 52 %
Platelet Count: 210 K/uL (ref 150–400)
RBC: 2.93 MIL/uL — ABNORMAL LOW (ref 4.22–5.81)
RDW: 14.2 % (ref 11.5–15.5)
WBC Count: 4.9 K/uL (ref 4.0–10.5)
nRBC: 0 % (ref 0.0–0.2)

## 2023-11-08 LAB — IRON AND IRON BINDING CAPACITY (CC-WL,HP ONLY)
Iron: 157 ug/dL (ref 45–182)
Saturation Ratios: 63 % — ABNORMAL HIGH (ref 17.9–39.5)
TIBC: 248 ug/dL — ABNORMAL LOW (ref 250–450)
UIBC: 91 ug/dL — ABNORMAL LOW (ref 117–376)

## 2023-11-08 LAB — VITAMIN B12: Vitamin B-12: 1493 pg/mL — ABNORMAL HIGH (ref 180–914)

## 2023-11-08 LAB — FERRITIN: Ferritin: 1781 ng/mL — ABNORMAL HIGH (ref 24–336)

## 2023-11-08 MED ORDER — EPOETIN ALFA-EPBX 40000 UNIT/ML IJ SOLN
40000.0000 [IU] | Freq: Once | INTRAMUSCULAR | Status: AC
  Administered 2023-11-08 (×2): 40000 [IU] via SUBCUTANEOUS
  Filled 2023-11-08: qty 1

## 2023-11-14 ENCOUNTER — Encounter: Payer: Self-pay | Admitting: Family Medicine

## 2023-11-14 ENCOUNTER — Ambulatory Visit: Admitting: Family Medicine

## 2023-11-14 VITALS — BP 139/82 | HR 76 | Temp 97.2°F | Ht 76.5 in | Wt 242.6 lb

## 2023-11-14 DIAGNOSIS — R059 Cough, unspecified: Secondary | ICD-10-CM

## 2023-11-14 DIAGNOSIS — E1122 Type 2 diabetes mellitus with diabetic chronic kidney disease: Secondary | ICD-10-CM

## 2023-11-14 DIAGNOSIS — I152 Hypertension secondary to endocrine disorders: Secondary | ICD-10-CM | POA: Diagnosis not present

## 2023-11-14 DIAGNOSIS — E1159 Type 2 diabetes mellitus with other circulatory complications: Secondary | ICD-10-CM | POA: Diagnosis not present

## 2023-11-14 DIAGNOSIS — N183 Chronic kidney disease, stage 3 unspecified: Secondary | ICD-10-CM

## 2023-11-14 LAB — POC COVID19 BINAXNOW: SARS Coronavirus 2 Ag: POSITIVE — AB

## 2023-11-14 MED ORDER — BENZONATATE 200 MG PO CAPS: 200.0000 mg | ORAL_CAPSULE | Freq: Two times a day (BID) | ORAL | 0 refills | Status: DC | PRN

## 2023-11-14 MED ORDER — AMOXICILLIN-POT CLAVULANATE 875-125 MG PO TABS
1.0000 | ORAL_TABLET | Freq: Two times a day (BID) | ORAL | 0 refills | Status: DC
Start: 2023-11-14 — End: 2024-01-24

## 2023-11-14 NOTE — Patient Instructions (Signed)
 It was very nice to see you today!  VISIT SUMMARY: You visited us  today due to a cough and congestion. You tested positive for COVID-19.  YOUR PLAN: COVID-19 INFECTION: You have a confirmed COVID-19 infection with mild symptoms. -Take Tessalon  as prescribed for your cough. -You have been given a backup antibiotic to use if your symptoms worsen or do not improve. -Monitor your symptoms and contact us  if they worsen or do not improve. -Your symptoms are expected to resolve within a week.  Return if symptoms worsen or fail to improve.   Take care, Dr Kennyth  PLEASE NOTE:  If you had any lab tests, please let us  know if you have not heard back within a few days. You may see your results on mychart before we have a chance to review them but we will give you a call once they are reviewed by us .   If we ordered any referrals today, please let us  know if you have not heard from their office within the next week.   If you had any urgent prescriptions sent in today, please check with the pharmacy within an hour of our visit to make sure the prescription was transmitted appropriately.   Please try these tips to maintain a healthy lifestyle:  Eat at least 3 REAL meals and 1-2 snacks per day.  Aim for no more than 5 hours between eating.  If you eat breakfast, please do so within one hour of getting up.   Each meal should contain half fruits/vegetables, one quarter protein, and one quarter carbs (no bigger than a computer mouse)  Cut down on sweet beverages. This includes juice, soda, and sweet tea.   Drink at least 1 glass of water with each meal and aim for at least 8 glasses per day  Exercise at least 150 minutes every week.

## 2023-11-14 NOTE — Assessment & Plan Note (Signed)
 Last A1c 6.1 without meds

## 2023-11-14 NOTE — Progress Notes (Signed)
   Mitchell Alvarado is a 73 y.o. male who presents today for an office visit.  Assessment/Plan:  New/Acute Problems: COVID Rapid COVID positive.  Symptoms started a week ago-do not think he needs paxlovid or specific antivirals at this point.  No red flag signs or symptoms or signs of bacterial secondary infection.  We did discuss conservative measures.  Will start Tessalon  as needed for cough.  Encouraged hydration.  Will send pocket prescription for Augmentin  with instruction to not start unless symptoms fail to improve over the next several days.  We discussed reasons to return to care.  Follow-up as needed.  Chronic Problems Addressed Today: Hypertension associated with diabetes (HCC) Blood pressure at goal today on Coreg  3.125 mg twice daily and HCTZ 25 mg daily.  Diabetes (HCC) Last A1c 6.1 without meds     Subjective:  HPI:  See assessment / plan for status of chronic conditions.    Discussed the use of AI scribe software for clinical note transcription with the patient, who gave verbal consent to proceed.  History of Present Illness Mitchell Alvarado is a 73 year old male who presents with cough and congestion.  He has a history of allergies and takes allergy medication regularly. Early last week, he began experiencing an occasional cough, which worsened over time. By Friday, he sought over-the-counter medication for congestion and cough from the pharmacy.  Initially, he took the medication but vomited it up twice. He started drinking hot tea and water, and by Sunday morning, he was able to take a half dose of the medication without vomiting. He has continued taking Mucinex for cough and chest congestion since then.  His symptoms have stabilized somewhat, but he still experiences nasal congestion and phlegm. He tested positive for COVID-19, and his symptoms began about a week ago.         Objective:  Physical Exam: BP 139/82   Pulse 76   Temp (!) 97.2 F (36.2 C)  (Temporal)   Ht 6' 4.5 (1.943 m)   Wt 242 lb 9.6 oz (110 kg)   SpO2 98%   BMI 29.15 kg/m   Gen: No acute distress, resting comfortably CV: Regular rate and rhythm with no murmurs appreciated Pulm: Normal work of breathing, clear to auscultation bilaterally with no crackles, wheezes, or rhonchi Neuro: Grossly normal, moves all extremities Psych: Normal affect and thought content      Toshie Demelo M. Kennyth, MD 11/14/2023 12:15 PM

## 2023-11-14 NOTE — Assessment & Plan Note (Signed)
 Blood pressure at goal today on Coreg  3.125 mg twice daily and HCTZ 25 mg daily.

## 2023-12-05 ENCOUNTER — Other Ambulatory Visit: Payer: Self-pay

## 2023-12-05 DIAGNOSIS — D469 Myelodysplastic syndrome, unspecified: Secondary | ICD-10-CM

## 2023-12-05 DIAGNOSIS — D631 Anemia in chronic kidney disease: Secondary | ICD-10-CM

## 2023-12-06 ENCOUNTER — Inpatient Hospital Stay: Attending: Hematology

## 2023-12-06 VITALS — BP 148/60 | HR 71 | Resp 16

## 2023-12-06 DIAGNOSIS — N183 Chronic kidney disease, stage 3 unspecified: Secondary | ICD-10-CM | POA: Insufficient documentation

## 2023-12-06 DIAGNOSIS — E1122 Type 2 diabetes mellitus with diabetic chronic kidney disease: Secondary | ICD-10-CM | POA: Insufficient documentation

## 2023-12-06 DIAGNOSIS — D469 Myelodysplastic syndrome, unspecified: Secondary | ICD-10-CM

## 2023-12-06 DIAGNOSIS — D631 Anemia in chronic kidney disease: Secondary | ICD-10-CM | POA: Insufficient documentation

## 2023-12-06 DIAGNOSIS — N189 Chronic kidney disease, unspecified: Secondary | ICD-10-CM

## 2023-12-06 LAB — CBC WITH DIFFERENTIAL (CANCER CENTER ONLY)
Abs Immature Granulocytes: 0 K/uL (ref 0.00–0.07)
Basophils Absolute: 0 K/uL (ref 0.0–0.1)
Basophils Relative: 1 %
Eosinophils Absolute: 0.3 K/uL (ref 0.0–0.5)
Eosinophils Relative: 7 %
HCT: 31 % — ABNORMAL LOW (ref 39.0–52.0)
Hemoglobin: 10.4 g/dL — ABNORMAL LOW (ref 13.0–17.0)
Immature Granulocytes: 0 %
Lymphocytes Relative: 40 %
Lymphs Abs: 1.6 K/uL (ref 0.7–4.0)
MCH: 32.7 pg (ref 26.0–34.0)
MCHC: 33.5 g/dL (ref 30.0–36.0)
MCV: 97.5 fL (ref 80.0–100.0)
Monocytes Absolute: 0.3 K/uL (ref 0.1–1.0)
Monocytes Relative: 8 %
Neutro Abs: 1.8 K/uL (ref 1.7–7.7)
Neutrophils Relative %: 44 %
Platelet Count: 215 K/uL (ref 150–400)
RBC: 3.18 MIL/uL — ABNORMAL LOW (ref 4.22–5.81)
RDW: 13.7 % (ref 11.5–15.5)
WBC Count: 4 K/uL (ref 4.0–10.5)
nRBC: 0 % (ref 0.0–0.2)

## 2023-12-06 LAB — CMP (CANCER CENTER ONLY)
ALT: 23 U/L (ref 0–44)
AST: 21 U/L (ref 15–41)
Albumin: 3.9 g/dL (ref 3.5–5.0)
Alkaline Phosphatase: 27 U/L — ABNORMAL LOW (ref 38–126)
Anion gap: 6 (ref 5–15)
BUN: 36 mg/dL — ABNORMAL HIGH (ref 8–23)
CO2: 22 mmol/L (ref 22–32)
Calcium: 9.6 mg/dL (ref 8.9–10.3)
Chloride: 109 mmol/L (ref 98–111)
Creatinine: 2.23 mg/dL — ABNORMAL HIGH (ref 0.61–1.24)
GFR, Estimated: 30 mL/min — ABNORMAL LOW (ref >60)
Glucose, Bld: 135 mg/dL — ABNORMAL HIGH (ref 70–99)
Potassium: 4.6 mmol/L (ref 3.5–5.1)
Sodium: 137 mmol/L (ref 135–145)
Total Bilirubin: 0.6 mg/dL (ref 0.0–1.2)
Total Protein: 6.5 g/dL (ref 6.5–8.1)

## 2023-12-06 LAB — IRON AND IRON BINDING CAPACITY (CC-WL,HP ONLY)
Iron: 174 ug/dL (ref 45–182)
Saturation Ratios: 80 % — ABNORMAL HIGH (ref 17.9–39.5)
TIBC: 218 ug/dL — ABNORMAL LOW (ref 250–450)
UIBC: 44 ug/dL — ABNORMAL LOW (ref 117–376)

## 2023-12-06 LAB — VITAMIN B12: Vitamin B-12: 1712 pg/mL — ABNORMAL HIGH (ref 180–914)

## 2023-12-06 LAB — FERRITIN: Ferritin: 1685 ng/mL — ABNORMAL HIGH (ref 24–336)

## 2023-12-06 MED ORDER — EPOETIN ALFA-EPBX 40000 UNIT/ML IJ SOLN
40000.0000 [IU] | Freq: Once | INTRAMUSCULAR | Status: AC
  Administered 2023-12-06: 40000 [IU] via SUBCUTANEOUS
  Filled 2023-12-06: qty 1

## 2023-12-22 ENCOUNTER — Encounter: Payer: Self-pay | Admitting: Hematology

## 2023-12-28 ENCOUNTER — Other Ambulatory Visit: Payer: Self-pay | Admitting: Family Medicine

## 2024-01-05 ENCOUNTER — Other Ambulatory Visit: Payer: Self-pay

## 2024-01-05 DIAGNOSIS — D631 Anemia in chronic kidney disease: Secondary | ICD-10-CM

## 2024-01-05 DIAGNOSIS — D469 Myelodysplastic syndrome, unspecified: Secondary | ICD-10-CM

## 2024-01-08 ENCOUNTER — Inpatient Hospital Stay: Admitting: Hematology

## 2024-01-08 ENCOUNTER — Inpatient Hospital Stay: Attending: Hematology

## 2024-01-08 ENCOUNTER — Inpatient Hospital Stay

## 2024-01-08 VITALS — BP 157/62 | HR 68 | Temp 97.9°F | Resp 16 | Wt 242.2 lb

## 2024-01-08 DIAGNOSIS — N183 Chronic kidney disease, stage 3 unspecified: Secondary | ICD-10-CM | POA: Insufficient documentation

## 2024-01-08 DIAGNOSIS — N189 Chronic kidney disease, unspecified: Secondary | ICD-10-CM | POA: Diagnosis not present

## 2024-01-08 DIAGNOSIS — D631 Anemia in chronic kidney disease: Secondary | ICD-10-CM | POA: Diagnosis not present

## 2024-01-08 DIAGNOSIS — E1122 Type 2 diabetes mellitus with diabetic chronic kidney disease: Secondary | ICD-10-CM | POA: Diagnosis not present

## 2024-01-08 DIAGNOSIS — D469 Myelodysplastic syndrome, unspecified: Secondary | ICD-10-CM

## 2024-01-08 DIAGNOSIS — D510 Vitamin B12 deficiency anemia due to intrinsic factor deficiency: Secondary | ICD-10-CM

## 2024-01-08 LAB — CBC WITH DIFFERENTIAL (CANCER CENTER ONLY)
Abs Immature Granulocytes: 0.01 K/uL (ref 0.00–0.07)
Basophils Absolute: 0 K/uL (ref 0.0–0.1)
Basophils Relative: 1 %
Eosinophils Absolute: 0.1 K/uL (ref 0.0–0.5)
Eosinophils Relative: 3 %
HCT: 28.5 % — ABNORMAL LOW (ref 39.0–52.0)
Hemoglobin: 9.8 g/dL — ABNORMAL LOW (ref 13.0–17.0)
Immature Granulocytes: 0 %
Lymphocytes Relative: 44 %
Lymphs Abs: 2 K/uL (ref 0.7–4.0)
MCH: 33.1 pg (ref 26.0–34.0)
MCHC: 34.4 g/dL (ref 30.0–36.0)
MCV: 96.3 fL (ref 80.0–100.0)
Monocytes Absolute: 0.4 K/uL (ref 0.1–1.0)
Monocytes Relative: 10 %
Neutro Abs: 1.9 K/uL (ref 1.7–7.7)
Neutrophils Relative %: 42 %
Platelet Count: 195 K/uL (ref 150–400)
RBC: 2.96 MIL/uL — ABNORMAL LOW (ref 4.22–5.81)
RDW: 14.6 % (ref 11.5–15.5)
WBC Count: 4.5 K/uL (ref 4.0–10.5)
nRBC: 0 % (ref 0.0–0.2)

## 2024-01-08 LAB — IRON AND IRON BINDING CAPACITY (CC-WL,HP ONLY)
Iron: 214 ug/dL — ABNORMAL HIGH (ref 45–182)
Saturation Ratios: 96 % — ABNORMAL HIGH (ref 17.9–39.5)
TIBC: 223 ug/dL — ABNORMAL LOW (ref 250–450)
UIBC: 9 ug/dL — ABNORMAL LOW (ref 117–376)

## 2024-01-08 LAB — CMP (CANCER CENTER ONLY)
ALT: 16 U/L (ref 0–44)
AST: 23 U/L (ref 15–41)
Albumin: 3.7 g/dL (ref 3.5–5.0)
Alkaline Phosphatase: 22 U/L — ABNORMAL LOW (ref 38–126)
Anion gap: 8 (ref 5–15)
BUN: 27 mg/dL — ABNORMAL HIGH (ref 8–23)
CO2: 23 mmol/L (ref 22–32)
Calcium: 9.6 mg/dL (ref 8.9–10.3)
Chloride: 104 mmol/L (ref 98–111)
Creatinine: 2.15 mg/dL — ABNORMAL HIGH (ref 0.61–1.24)
GFR, Estimated: 32 mL/min — ABNORMAL LOW (ref >60)
Glucose, Bld: 122 mg/dL — ABNORMAL HIGH (ref 70–99)
Potassium: 4.3 mmol/L (ref 3.5–5.1)
Sodium: 135 mmol/L (ref 135–145)
Total Bilirubin: 0.5 mg/dL (ref 0.0–1.2)
Total Protein: 6.1 g/dL — ABNORMAL LOW (ref 6.5–8.1)

## 2024-01-08 LAB — FERRITIN: Ferritin: 1710 ng/mL — ABNORMAL HIGH (ref 24–336)

## 2024-01-08 LAB — VITAMIN B12: Vitamin B-12: 1903 pg/mL — ABNORMAL HIGH (ref 180–914)

## 2024-01-08 MED ORDER — EPOETIN ALFA-EPBX 40000 UNIT/ML IJ SOLN
40000.0000 [IU] | Freq: Once | INTRAMUSCULAR | Status: AC
  Administered 2024-01-08: 40000 [IU] via SUBCUTANEOUS
  Filled 2024-01-08: qty 1

## 2024-01-08 NOTE — Progress Notes (Signed)
 HEMATOLOGY/ONCOLOGY CLINIC NOTE  Date of Service: 01/08/24     Patient Care Team: Kennyth Worth HERO, MD as PCP - General (Family Medicine) Leslee Reusing, MD as Attending Physician (Ophthalmology) Alline Lenis, MD (Inactive) as Attending Physician (Urology) Douglass Lunger, MD as Consulting Physician (Nephrology) Dow Maxwell, PT as Physical Therapist (Physical Therapy)  CHIEF COMPLAINTS/PURPOSE OF CONSULTATION:  Follow-up for multifactorial anemia  HISTORY OF PRESENTING ILLNESS:   Mitchell Alvarado is a wonderful 73 y.o. male who has been referred to us  by Dr Alyce Staff for evaluation and management of anemia. The pt reports that he is doing well overall.   The pt reports that he has been taking monthly Vitamin B12 injections for the last 4-5 years, and his deficiency was first realized through routine blood work. He was taken off Metformin  at the time. He has taken Omeprazole  for 20 years for his Barrett's esophagus. He denies any food intolerances or thyroid  problems. He notes that he has had some fatigue which he attributes to getting older.   He has seen a nephrologist several years ago and was discharged after replacing his Vitamin D  satisfactorily and he denies any liver problems as well.   Most recent lab results (05/29/17) of CBC  is as follows: all values are WNL except for WBC at 3.5k, RBC at 3.04, HGB at 11.3, HCT at 32.9, MCV at 108.0.  Review of CBC w/diff over the last two years reveals stability in the above abnormalities.   On review of systems, pt reports some fatigue, stable weight, and denies problems of infection, bleeding, back pains, pain along the spine, abdominal pains, changes in bowel habits, leg selling, urinary discomfort, and any other symptoms.   On PMHx the pt reports neuropathy, prostate surgery in 2000, denies Afib. On Social Hx the pt reports 10-14 glasses of wine each week  and denies any concern for excessive consumption. He denies smoking and  smoked cigarettes for one year a long time ago  On Family Hx the pt reports paternal heart attack.  Interval History:   Mitchell Alvarado is here for continued evaluation and management of his multifactorial anemia.  Patient was last seen by me on 07/18/2023 and Patient notes he has been doing well overall since our last visit. He has been tolerating his Retacrit  injection well without any new or severe toxicities.   He denies any new infection issues, fever, chills, night sweats, unexpected weight loss, back pain, chest pain, abdominal pain, or leg swelling. He does complain of mild muscle weakness.   Patient notes that his blood pressure have in the normal range at home.   He has PmHx of enlarged prostate, but denies prostate cancer. He denies FmHx of prostate cancer.   Patient has not followed up with his PCP regarding testosterone  replacement.  Seen by Neomi Johnston DASEN, PA-C on 10/11/2023:  Mitchell Alvarado is here for continued evaluation and management of his multifactorial anemia. He is due for another retacrit  injection. He reports improvement of his fatigue since starting retacrit  injection. His appetite and weight are stable. He denies nausea, vomiting or bowel habit changes. He denies easy bruising or signs of bleeding. He denies fevers, chills, sweats, shortness of breath, chest pain or cough. He has no other complaints.   Today, he says that since he has started Testosterone  he has seen some improvement in his energy levels.   Tolerating erytropoetein, has improved his Hgb Kidneys stable  Iron high  -  no hx of hemochromatosis No indication for iron replacement Other labs stale WBC and PLTs  Covid-19 in August - treated with Augmentin  and mucinex  UTD with vaccinations - Flu and PNA 10/02 Shingrix and RSV 2024  No leg swelling or SOB  Measures BP at home - Recommend measuring BP first thing in the morning   MEDICAL HISTORY:  Past Medical History:   Diagnosis Date   Allergy    ANEMIA, IRON DEFICIENCY 12/23/2009   Barrett's esophagus without dysplasia 05/2012   DIABETES MELLITUS, TYPE II 12/30/2006   ECZEMA 05/15/2009   GERD 12/30/2006   HYPERLIPIDEMIA 12/30/2006   HYPERTENSION 12/30/2006   HYPERTHYROIDISM 06/03/2008   Pernicious anemia 12/30/2006   Personal history of colonic polyps - adenoma 07/22/2004    SURGICAL HISTORY: Past Surgical History:  Procedure Laterality Date   COLONOSCOPY  multiple   COLONOSCOPY WITH PROPOFOL   07/27/2015   Dr.Gessner   ESOPHAGOGASTRODUODENOSCOPY  multiple   POLYPECTOMY     TRANSURETHRAL RESECTION OF PROSTATE  03/28/1998   SOCIAL HISTORY: Social History   Socioeconomic History   Marital status: Legally Separated    Spouse name: Not on file   Number of children: Not on file   Years of education: Not on file   Highest education level: Bachelor's degree (e.g., BA, AB, BS)  Occupational History   Occupation: Retired from school system  Tobacco Use   Smoking status: Former   Smokeless tobacco: Never  Vaping Use   Vaping status: Never Used  Substance and Sexual Activity   Alcohol use: Yes    Alcohol/week: 7.0 - 10.0 standard drinks of alcohol    Types: 7 - 10 Glasses of wine per week    Comment: 7-10 glasses wine weekly   Drug use: No   Sexual activity: Not on file  Other Topics Concern   Not on file  Social History Narrative   Married   Retired Merchandiser, 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, hospital [employer]   7 drinks/week   Former smoker   No drug use   Social Drivers of Corporate investment banker Strain: Low Risk  (09/08/2023)   Overall Financial Resource Strain (CARDIA)    Difficulty of Paying Living Expenses: Not hard at all  Food Insecurity: No Food Insecurity (09/08/2023)   Hunger Vital Sign    Worried About Running Out of Food in the Last Year: Never true    Ran Out of Food in the Last Year: Never true  Transportation Needs: No Transportation  Needs (09/08/2023)   PRAPARE - Administrator, Civil Service (Medical): No    Lack of Transportation (Non-Medical): No  Physical Activity: Inactive (09/08/2023)   Exercise Vital Sign    Days of Exercise per Week: 0 days    Minutes of Exercise per Session: Not on file  Stress: No Stress Concern Present (09/08/2023)   [employer] of Occupational Health - Occupational Stress Questionnaire    Feeling of Stress: Not at all  Social Connections: Moderately Isolated (09/08/2023)   Social Connection and Isolation Panel    Frequency of Communication with Friends and Family: Once a week    Frequency of Social Gatherings with Friends and Family: Once a week    Attends Religious Services: 1 to 4 times per year    Active Member of [employer] or Organizations: Yes    Attends Banker Meetings: 1 to 4 times per year    Marital Status: Separated  Intimate Partner  Violence: Not At Risk (01/25/2023)   Humiliation, Afraid, Rape, and Kick questionnaire    Fear of Current or Ex-Partner: No    Emotionally Abused: No    Physically Abused: No    Sexually Abused: No    FAMILY HISTORY: Family History  Problem Relation Age of Onset   Heart attack Father        MI   Cancer Neg Hx    Colon cancer Neg Hx    Esophageal cancer Neg Hx    Stomach cancer Neg Hx     ALLERGIES:  has no known allergies.  MEDICATIONS:  Current Outpatient Medications  Medication Sig Dispense Refill   amoxicillin -clavulanate (AUGMENTIN ) 875-125 MG tablet Take 1 tablet by mouth 2 (two) times daily. 20 tablet 0   aspirin  81 MG tablet Take 1 tablet (81 mg total) by mouth daily. 90 tablet 3   benzonatate  (TESSALON ) 200 MG capsule Take 1 capsule (200 mg total) by mouth 2 (two) times daily as needed for cough. 20 capsule 0   calcitRIOL  (ROCALTROL ) 0.25 MCG capsule TAKE 1 CAPSULE (0.25 MCG TOTAL) BY MOUTH EVERY MONDAY, WEDNESDAY, AND FRIDAY 36 capsule 0   carvedilol  (COREG ) 3.125 MG tablet TAKE 1 TAB BY MOUTH 2  TIMES DAILY. PLEASE CALL [phone] TO SCHEDULE YOUR PHYSICAL IN APRIL FOR FURTHER REFILLS 180 tablet 1   Cholecalciferol (VITAMIN D ) 2000 units CAPS Take 2,000 Units by mouth daily.     ezetimibe -simvastatin  (VYTORIN ) 10-80 MG tablet TAKE 1 TABLET BY MOUTH EVERY DAY 90 tablet 1   fenofibrate  (TRICOR ) 145 MG tablet TAKE 1 TABLET BY MOUTH EVERY DAY 90 tablet 0   glucose blood (ONETOUCH VERIO) test strip 1 each by Other route daily. And lancets 1/day 100 each 3   hydrochlorothiazide  (HYDRODIURIL ) 25 MG tablet TAKE 1 TABLET BY MOUTH EVERY DAY 90 tablet 0   latanoprost (XALATAN) 0.005 % ophthalmic solution Place 1 drop into both eyes at bedtime.     omeprazole  (PRILOSEC ) 20 MG capsule TAKE 1 CAPSULE BY MOUTH EVERY DAY 90 capsule 3   Testosterone  20.25 MG/ACT (1.62%) GEL PLACE 1 PUMP ONTO THE SKIN DAILY IN THE AFTERNOON. 75 g 0   vitamin B-12 (CYANOCOBALAMIN ) 1000 MCG tablet Take 1,000 mcg by mouth daily.     No current facility-administered medications for this visit.   Facility-Administered Medications Ordered in Other Visits  Medication Dose Route Frequency Provider Last Rate Last Admin   epoetin  alfa-epbx (RETACRIT ) injection 40,000 Units  40,000 Units Subcutaneous Once Onesimo Emaline Brink, MD        REVIEW OF SYSTEMS:   10 Point review of Systems was done is negative except as noted above.  PHYSICAL EXAMINATION: Vitals:   01/08/24 1415 01/08/24 1423  BP: (!) 161/63 (!) 157/62  Pulse: 68   Resp: 16   Temp: 97.9 F (36.6 C)   SpO2: 98%    Filed Weights   01/08/24 1415  Weight: 242 lb 3.2 oz (109.9 kg)   .Body mass index is 29.1 kg/m.  SABRA GENERAL:alert, in no acute distress and comfortable SKIN: no acute rashes, no significant lesions EYES: conjunctiva are pink and non-injected, sclera anicteric OROPHARYNX: MMM, no exudates, no oropharyngeal erythema or ulceration NECK: supple, no JVD LYMPH:  no palpable lymphadenopathy in the cervical, axillary or inguinal regions LUNGS:  clear to auscultation b/l with normal respiratory effort HEART: regular rate & rhythm ABDOMEN:  normoactive bowel sounds , non tender, not distended. Extremity: no pedal edema PSYCH: alert & oriented x 3  with fluent speech NEURO: no focal motor/sensory deficits   LABORATORY DATA:  I have reviewed the data as listed .    Latest Ref Rng & Units 01/08/2024    1:11 PM 12/06/2023    2:03 PM 11/08/2023    1:48 PM  CBC  WBC 4.0 - 10.5 K/uL 4.5  4.0  4.9   Hemoglobin 13.0 - 17.0 g/dL 9.8  89.5  9.9   Hematocrit 39.0 - 52.0 % 28.5  31.0  28.8   Platelets 150 - 400 K/uL 195  215  210    .CBC    Component Value Date/Time   WBC 4.5 01/08/2024 1311   WBC 4.4 09/12/2023 1319   RBC 2.96 (L) 01/08/2024 1311   HGB 9.8 (L) 01/08/2024 1311   HCT 28.5 (L) 01/08/2024 1311   HCT 31.1 (L) 09/07/2017 1405   PLT 195 01/08/2024 1311   MCV 96.3 01/08/2024 1311   MCH 33.1 01/08/2024 1311   MCHC 34.4 01/08/2024 1311   RDW 14.6 01/08/2024 1311   LYMPHSABS 2.0 01/08/2024 1311   MONOABS 0.4 01/08/2024 1311   EOSABS 0.1 01/08/2024 1311   BASOSABS 0.0 01/08/2024 1311    .    Latest Ref Rng & Units 01/08/2024    1:11 PM 12/06/2023    2:03 PM 11/08/2023    1:48 PM  CMP  Glucose 70 - 99 mg/dL 877  864  873   BUN 8 - 23 mg/dL 27  36  34   Creatinine 0.61 - 1.24 mg/dL 7.84  7.76  7.74   Sodium 135 - 145 mmol/L 135  137  137   Potassium 3.5 - 5.1 mmol/L 4.3  4.6  4.5   Chloride 98 - 111 mmol/L 104  109  105   CO2 22 - 32 mmol/L 23  22  25    Calcium 8.9 - 10.3 mg/dL 9.6  9.6  9.7   Total Protein 6.5 - 8.1 g/dL 6.1  6.5  6.5   Total Bilirubin 0.0 - 1.2 mg/dL 0.5  0.6  0.7   Alkaline Phos 38 - 126 U/L 22  27  23    AST 15 - 41 U/L 23  21  23    ALT 0 - 44 U/L 16  23  16     Iron Panel Lab Results  Component Value Date   IRON 214 (H) 01/08/2024   UIBC 9 (L) 01/08/2024   TIBC 223 (L) 01/08/2024   IRONPCTSAT 96 (H) 01/08/2024   FERRITIN 1,710 (H) 01/08/2024    Component     Latest Ref Rng &  Units 09/07/2017  IgG (Immunoglobin G), Serum     700 - 1,600 mg/dL 176  IgA     61 - 562 mg/dL 751  IgM (Immunoglobulin M), Srm     20 - 172 mg/dL 856  Total Protein ELP     6.0 - 8.5 g/dL 6.7  Albumin SerPl Elph-Mcnc     2.9 - 4.4 g/dL 4.0  Alpha 1     0.0 - 0.4 g/dL 0.2  Alpha2 Glob SerPl Elph-Mcnc     0.4 - 1.0 g/dL 0.8  B-Globulin SerPl Elph-Mcnc     0.7 - 1.3 g/dL 1.1  Gamma Glob SerPl Elph-Mcnc     0.4 - 1.8 g/dL 0.6  M Protein SerPl Elph-Mcnc     Not Observed g/dL Not Observed  Globulin, Total     2.2 - 3.9 g/dL 2.7  Albumin/Glob SerPl     0.7 - 1.7  1.5  IFE 1      Comment  Please Note (HCV):      Comment  Folate, Hemolysate     Not Estab. ng/mL 204.0  HCT     37.5 - 51.0 % 31.1 (L)  Folate, RBC     >498 ng/mL 656  Vitamin B12     180 - 914 pg/mL 6,847 (H)  TSH     0.320 - 4.118 uIU/mL 1.016  Ferritin     22 - 316 ng/mL 867 (H)  Copper      72 - 166 ug/dL 97  Parietal Cell Antibody-IgG     0.0 - 20.0 Units 10.9  Intrinsic Factor     0.0 - 1.1 AU/mL 11.8 (H)    RADIOGRAPHIC STUDIES: I have personally reviewed the radiological images as listed and agreed with the findings in the report. No results found.   ASSESSMENT & PLAN:   73 y.o. male with  1. Macrocytic Anemia - multifactorial -- pernicious anemia with anti IF antibodies + Anemia from CKD + cannot r/o an element of low grade MDS ? ? ETOH use  , Low testosterone  levels Review of patient's CBCs w/diff over the last two years reveals stability on the above abnormalities  Patient appears to have pernicious anemia with anti intrinsic factor antibodies. Currently no B12 or folate deficiency deficiency.but could have other B vit deficiencies. No monoclonal paraproteinemia. Concern for ETOH use Has some CKD that could be contributing to the anemia as well. No known liver disease No overt bleeding noted.  2. B12 deficiency -multifactorial -- pernicious anemia + previous metformin  use + current PPI  use.  3.  Elevated ferritin levels Patient previously had Barrett's esophagus and iron deficiency anemia.  Currently his ferritin is remained elevated likely in the context of significant previous iron replacement. -Hold off on any iron supplementation. HFE gene mutation study is negative for any genetic findings suggestive of hemochromatosis.  PLAN: -Discussed lab results from today, 01/08/2024, in detail with the patient.  CBC shows low Hgb of 9.8 with normal wbc counts and plts -patient has been started testosterone  with his PCP and notes some improvement in energy levels. -Patient has been tolerating his current Retacrit  injection dosage well without any new or severe toxicities.  -Continue with Retacrit  injection.  -Goal will be to keep hgb about 10.0 g/dL. Will hold retacrit  for Hgb>10.5 -Answered all of patient's questions. -Continue Vitamin B-12 and B-complex supplement.   FOLLOW-UP: Erythropoeitin shot evey 4 weeks x 4 doses with labs MD visit in 3 months  .The total time spent in the appointment was 21 minutes* .  All of the patient's questions were answered with apparent satisfaction. The patient knows to call the clinic with any problems, questions or concerns.   Emaline Saran MD MS AAHIVMS [facility] Hematology/Oncology Physician [facility]  .*Total Encounter Time as defined by the Centers for Medicare and Medicaid Services includes, in addition to the face-to-face time of a patient visit (documented in the note above) non-face-to-face time: obtaining and reviewing outside history, ordering and reviewing medications, tests or procedures, care coordination (communications with other health care professionals or caregivers) and documentation in the medical record.

## 2024-01-10 ENCOUNTER — Other Ambulatory Visit: Payer: Self-pay | Admitting: Family Medicine

## 2024-01-14 ENCOUNTER — Encounter: Payer: Self-pay | Admitting: Hematology

## 2024-01-24 ENCOUNTER — Ambulatory Visit: Payer: Medicare PPO

## 2024-01-24 VITALS — Ht 73.5 in | Wt 242.0 lb

## 2024-01-24 DIAGNOSIS — Z1211 Encounter for screening for malignant neoplasm of colon: Secondary | ICD-10-CM

## 2024-01-24 DIAGNOSIS — Z0000 Encounter for general adult medical examination without abnormal findings: Secondary | ICD-10-CM

## 2024-01-24 NOTE — Patient Instructions (Signed)
 Mitchell Alvarado,  Thank you for taking the time for your Medicare Wellness Visit. I appreciate your continued commitment to your health goals. Please review the care plan we discussed, and feel free to reach out if I can assist you further.  Medicare recommends these wellness visits once per year to help you and your care team stay ahead of potential health issues. These visits are designed to focus on prevention, allowing your provider to concentrate on managing your acute and chronic conditions during your regular appointments.  Please note that Annual Wellness Visits do not include a physical exam. Some assessments may be limited, especially if the visit was conducted virtually. If needed, we may recommend a separate in-person follow-up with your provider.  Ongoing Care Seeing your primary care provider every 3 to 6 months helps us  monitor your health and provide consistent, personalized care.   Referrals If a referral was made during today's visit and you haven't received any updates within two weeks, please contact the referred provider directly to check on the status.  Recommended Screenings:  Health Maintenance  Topic Date Due   Complete foot exam   06/23/2020   COVID-19 Vaccine (9 - 2025-26 season) 11/27/2023   DTaP/Tdap/Td vaccine (3 - Td or Tdap) 01/16/2024   Medicare Annual Wellness Visit  01/25/2024   Colon Cancer Screening  01/30/2024   Eye exam for diabetics  02/16/2024   Hemoglobin A1C  03/13/2024   Yearly kidney health urinalysis for diabetes  09/11/2024   Yearly kidney function blood test for diabetes  01/07/2025   Pneumococcal Vaccine for age over 17  Completed   Flu Shot  Completed   Hepatitis C Screening  Completed   Zoster (Shingles) Vaccine  Completed   Meningitis B Vaccine  Aged Out       04/07/2023    9:30 AM  Advanced Directives  Does Patient Have a Medical Advance Directive? No  Would patient like information on creating a medical advance directive? No -  Patient declined   Advance Care Planning is important because it: Ensures you receive medical care that aligns with your values, goals, and preferences. Provides guidance to your family and loved ones, reducing the emotional burden of decision-making during critical moments.  Vision: Annual vision screenings are recommended for early detection of glaucoma, cataracts, and diabetic retinopathy. These exams can also reveal signs of chronic conditions such as diabetes and high blood pressure.  Dental: Annual dental screenings help detect early signs of oral cancer, gum disease, and other conditions linked to overall health, including heart disease and diabetes.  Please see the attached documents for additional preventive care recommendations.

## 2024-01-24 NOTE — Progress Notes (Signed)
 Subjective:   Mitchell Alvarado is a 73 y.o. who presents for a Medicare Wellness preventive visit.  As a reminder, Annual Wellness Visits don't include a physical exam, and some assessments may be limited, especially if this visit is performed virtually. We may recommend an in-person follow-up visit with your provider if needed.  Visit Complete: Virtual I connected with  Mitchell Alvarado on 01/24/24 by a video and audio enabled telemedicine application and verified that I am speaking with the correct person using two identifiers.  Patient Location: Home  Provider Location: Home Office  I discussed the limitations of evaluation and management by telemedicine. The patient expressed understanding and agreed to proceed.  Vital Signs: Because this visit was a virtual/telehealth visit, some criteria may be missing or patient reported. Any vitals not documented were not able to be obtained and vitals that have been documented are patient reported.    Persons Participating in Visit: Patient.  AWV Questionnaire: Yes: Patient Medicare AWV questionnaire was completed by the patient on 01/22/24; I have confirmed that all information answered by patient is correct and no changes since this date.  Cardiac Risk Factors include: advanced age (>33men, >7 women);dyslipidemia;male gender;hypertension;diabetes mellitus;obesity (BMI >30kg/m2)     Objective:    Today's Vitals   01/24/24 1337  Weight: 242 lb (109.8 kg)  Height: 6' 1.5 (1.867 m)   Body mass index is 31.5 kg/m.     01/24/2024    1:42 PM 04/07/2023    9:30 AM 01/25/2023    1:32 PM 01/27/2022    1:50 PM 01/14/2021    1:08 PM 01/09/2020   12:53 PM 09/05/2019    2:12 PM  Advanced Directives  Does Patient Have a Medical Advance Directive? Yes No Yes Yes Yes Yes No  Type of Estate Agent of [place];Living will  Healthcare Power of [place];Living will Healthcare Power of [place];Living will Healthcare Power of  [employer] [place];Living will   Does patient want to make changes to medical advance directive? No - Patient declined  No - Patient declined No - Patient declined     Copy of Healthcare Power of Attorney in Chart? Yes - validated most recent copy scanned in chart (See row information)  Yes - validated most recent copy scanned in chart (See row information) Yes - validated most recent copy scanned in chart (See row information) Yes - validated most recent copy scanned in chart (See row information) No - copy requested   Would patient like information on creating a medical advance directive?  No - Patient declined     No - Patient declined    Current Medications (verified) Outpatient Encounter Medications as of 01/24/2024  Medication Sig   aspirin  81 MG tablet Take 1 tablet (81 mg total) by mouth daily.   calcitRIOL  (ROCALTROL ) 0.25 MCG capsule TAKE 1 CAPSULE (0.25 MCG TOTAL) BY MOUTH EVERY MONDAY, WEDNESDAY, AND FRIDAY   carvedilol  (COREG ) 3.125 MG tablet TAKE 1 TAB BY MOUTH 2 TIMES DAILY. PLEASE CALL [phone] TO SCHEDULE YOUR PHYSICAL IN APRIL FOR FURTHER REFILLS   Cholecalciferol (VITAMIN D ) 2000 units CAPS Take 2,000 Units by mouth daily.   ezetimibe -simvastatin  (VYTORIN ) 10-80 MG tablet TAKE 1 TABLET BY MOUTH EVERY DAY   fenofibrate  (TRICOR ) 145 MG tablet TAKE 1 TABLET BY MOUTH EVERY DAY   glucose blood (ONETOUCH VERIO) test strip 1 each by Other route daily. And lancets 1/day   hydrochlorothiazide  (HYDRODIURIL ) 25 MG tablet TAKE 1 TABLET BY MOUTH  EVERY DAY   latanoprost (XALATAN) 0.005 % ophthalmic solution Place 1 drop into both eyes at bedtime.   omeprazole  (PRILOSEC ) 20 MG capsule TAKE 1 CAPSULE BY MOUTH EVERY DAY   Testosterone  20.25 MG/ACT (1.62%) GEL PLACE 1 PUMP ONTO THE SKIN DAILY IN THE AFTERNOON.   vitamin B-12 (CYANOCOBALAMIN ) 1000 MCG tablet Take 1,000 mcg by mouth daily.   [DISCONTINUED] amoxicillin -clavulanate (AUGMENTIN ) 875-125 MG tablet Take 1 tablet  by mouth 2 (two) times daily.   [DISCONTINUED] benzonatate  (TESSALON ) 200 MG capsule Take 1 capsule (200 mg total) by mouth 2 (two) times daily as needed for cough.   No facility-administered encounter medications on file as of 01/24/2024.    Allergies (verified) Patient has no known allergies.   History: Past Medical History:  Diagnosis Date   Allergy    ANEMIA, IRON DEFICIENCY 12/23/2009   Barrett's esophagus without dysplasia 05/2012   DIABETES MELLITUS, TYPE II 12/30/2006   ECZEMA 05/15/2009   GERD 12/30/2006   HYPERLIPIDEMIA 12/30/2006   HYPERTENSION 12/30/2006   HYPERTHYROIDISM 06/03/2008   Pernicious anemia 12/30/2006   Personal history of colonic polyps - adenoma 07/22/2004   Past Surgical History:  Procedure Laterality Date   COLONOSCOPY  multiple   COLONOSCOPY WITH PROPOFOL   07/27/2015   Dr.Gessner   ESOPHAGOGASTRODUODENOSCOPY  multiple   POLYPECTOMY     TRANSURETHRAL RESECTION OF PROSTATE  03/28/1998   Family History  Problem Relation Age of Onset   Heart attack Father        MI   Cancer Neg Hx    Colon cancer Neg Hx    Esophageal cancer Neg Hx    Stomach cancer Neg Hx    Social History   Socioeconomic History   Marital status: Legally Separated    Spouse name: Not on file   Number of children: Not on file   Years of education: Not on file   Highest education level: Bachelor's degree (e.g., BA, AB, BS)  Occupational History   Occupation: Retired from school system  Tobacco Use   Smoking status: Former   Smokeless tobacco: Never  Vaping Use   Vaping status: Never Used  Substance and Sexual Activity   Alcohol use: Yes    Alcohol/week: 7.0 - 10.0 standard drinks of alcohol    Types: 7 - 10 Glasses of wine per week    Comment: 7-10 glasses wine weekly   Drug use: No   Sexual activity: Not on file  Other Topics Concern   Not on file  Social History Narrative   Married   Retired merchandiser, 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, hospital [employer]   7 drinks/week   Former smoker   No drug use   Social Drivers of Corporate Investment Banker Strain: Low Risk  (01/22/2024)   Overall Financial Resource Strain (CARDIA)    Difficulty of Paying Living Expenses: Not hard at all  Food Insecurity: No Food Insecurity (01/22/2024)   Hunger Vital Sign    Worried About Running Out of Food in the Last Year: Never true    Ran Out of Food in the Last Year: Never true  Transportation Needs: No Transportation Needs (01/22/2024)   PRAPARE - Administrator, Civil Service (Medical): No    Lack of Transportation (Non-Medical): No  Physical Activity: Inactive (01/22/2024)   Exercise Vital Sign    Days of Exercise per Week: 0 days    Minutes of Exercise per Session: 0 min  Stress: No  Stress Concern Present (01/22/2024)   [employer] of Occupational Health - Occupational Stress Questionnaire    Feeling of Stress: Not at all  Social Connections: Moderately Isolated (01/22/2024)   Social Connection and Isolation Panel    Frequency of Communication with Friends and Family: Three times a week    Frequency of Social Gatherings with Friends and Family: Once a week    Attends Religious Services: Never    Database Administrator or Organizations: Yes    Attends Engineer, Structural: More than 4 times per year    Marital Status: Separated    Tobacco Counseling Counseling given: Not Answered    Clinical Intake:  Pre-visit preparation completed: Yes  Pain : No/denies pain     BMI - recorded: 31.5 Nutritional Status: BMI > 30  Obese Diabetes: Yes CBG done?: No Did pt. bring in CBG monitor from home?: No  Lab Results  Component Value Date   HGBA1C 6.1 09/12/2023   HGBA1C 6.2 07/07/2022   HGBA1C 6.2 06/28/2021     How often do you need to have someone help you when you read instructions, pamphlets, or other written materials from your doctor or pharmacy?: 1 - Never  Interpreter Needed?:  No  Information entered by :: Ellouise Haws, LPN   Activities of Daily Living     01/22/2024    2:42 PM 04/07/2023    9:14 AM  In your present state of health, do you have any difficulty performing the following activities:  Hearing? 0 0  Vision? 0 0  Difficulty concentrating or making decisions? 0 0  Walking or climbing stairs? 1   Comment use chair lift   Dressing or bathing? 0   Doing errands, shopping? 0   Preparing Food and eating ? N   Using the Toilet? N   In the past six months, have you accidently leaked urine? N   Do you have problems with loss of bowel control? N   Managing your Medications? N   Managing your Finances? N   Housekeeping or managing your Housekeeping? Y   Comment has programmer, applications     Patient Care Team: Kennyth Worth HERO, MD as PCP - General (Family Medicine) Leslee Reusing, MD as Attending Physician (Ophthalmology) Alline Lenis, MD (Inactive) as Attending Physician (Urology) Douglass Lunger, MD as Consulting Physician (Nephrology) Dow Maxwell, PT as Physical Therapist (Physical Therapy)  I have updated your Care Teams any recent Medical Services you may have received from other providers in the past year.     Assessment:   This is a routine wellness examination for Mitchell Alvarado.  Hearing/Vision screen Hearing Screening - Comments:: Pt denies any hearing issues  Vision Screening - Comments:: Wears rx glasses - up to date with routine eye exams with Dr Reusing Leslee    Goals Addressed               This Visit's Progress     lose 10 lbs (pt-stated)         Depression Screen     01/24/2024    1:42 PM 11/14/2023   11:35 AM 10/11/2023    2:53 PM 09/12/2023   12:55 PM 08/14/2023    2:32 PM 01/25/2023    1:32 PM 07/07/2022   10:56 AM  PHQ 2/9 Scores  PHQ - 2 Score 0 0 0 0 0 0 0    Fall Risk     01/22/2024    2:42 PM 11/14/2023   11:36 AM 09/12/2023  12:56 PM 08/14/2023    2:32 PM 01/20/2023    5:53 PM  Fall Risk   Falls in the  past year? 0 0 0 0 0  Number falls in past yr: 0 0 0 0 0  Injury with Fall? 0 0 0 0 0  Risk for fall due to : Impaired balance/gait No Fall Risks No Fall Risks;Impaired balance/gait Impaired balance/gait No Fall Risks  Risk for fall due to: Comment   walk with walker walk with cane   Follow up Falls prevention discussed    Falls prevention discussed    MEDICARE RISK AT HOME:  Medicare Risk at Home Any stairs in or around the home?: (Patient-Rptd) Yes If so, are there any without handrails?: (Patient-Rptd) No Home free of loose throw rugs in walkways, pet beds, electrical cords, etc?: (Patient-Rptd) No Adequate lighting in your home to reduce risk of falls?: (Patient-Rptd) Yes Life alert?: (Patient-Rptd) No Use of a cane, walker or w/c?: (Patient-Rptd) Yes Grab bars in the bathroom?: (Patient-Rptd) No Shower chair or bench in shower?: (Patient-Rptd) No Elevated toilet seat or a handicapped toilet?: (Patient-Rptd) No  TIMED UP AND GO:  Was the test performed?  No  Cognitive Function: 6CIT completed        01/24/2024    1:44 PM 01/25/2023    1:34 PM 01/27/2022    1:53 PM 01/14/2021    1:11 PM 01/09/2020   12:56 PM  6CIT Screen  What Year? 0 points 0 points 0 points 0 points 0 points  What month? 0 points 0 points 0 points 0 points 0 points  What time? 0 points 0 points 0 points 0 points   Count back from 20 0 points 0 points 0 points 0 points 0 points  Months in reverse 0 points 0 points 0 points 0 points 0 points  Repeat phrase 0 points 0 points 0 points 0 points 0 points  Total Score 0 points 0 points 0 points 0 points     Immunizations Immunization History  Administered Date(s) Administered   Fluad Quad(high Dose 65+) 12/06/2018   Fluad Trivalent(High Dose 65+) 12/20/2022   INFLUENZA, HIGH DOSE SEASONAL PF 12/28/2023   Influenza Whole 12/26/2008   Influenza,inj,Quad PF,6+ Mos 11/29/2016   Influenza-Unspecified 12/27/2012, 12/06/2013, 03/28/2014, 12/12/2017,  12/08/2020, 12/08/2021   PFIZER Comirnaty(Gray Top)Covid-19 Tri-Sucrose Vaccine 07/17/2020, 01/06/2022   PFIZER(Purple Top)SARS-COV-2 Vaccination 05/04/2019, 05/25/2019, 01/11/2020, 07/17/2020   Pfizer Covid-19 Vaccine Bivalent Booster 61yrs & up 12/22/2020, 11/05/2021   Pneumococcal Conjugate Pcv21, Polysaccharide Crm197 Conjugaf 12/28/2023   Pneumococcal Conjugate-13 05/26/2015   Pneumococcal Polysaccharide-23 05/15/2009, 11/28/2017   Respiratory Syncytial Virus Vaccine,Recomb Aduvanted(Arexvy) 01/06/2022   Td 01/27/1995   Tdap 01/15/2014   Zoster Recombinant(Shingrix) 04/22/2020, 06/23/2020   Zoster, Live 05/26/2015    Screening Tests Health Maintenance  Topic Date Due   FOOT EXAM  06/23/2020   COVID-19 Vaccine (9 - 2025-26 season) 11/27/2023   DTaP/Tdap/Td (3 - Td or Tdap) 01/16/2024   Colonoscopy  01/30/2024   OPHTHALMOLOGY EXAM  02/16/2024   HEMOGLOBIN A1C  03/13/2024   Diabetic kidney evaluation - Urine ACR  09/11/2024   Diabetic kidney evaluation - eGFR measurement  01/07/2025   Medicare Annual Wellness (AWV)  01/23/2025   Pneumococcal Vaccine: 50+ Years  Completed   Influenza Vaccine  Completed   Hepatitis C Screening  Completed   Zoster Vaccines- Shingrix  Completed   Meningococcal B Vaccine  Aged Out    Health Maintenance Items Addressed: Referral sent to GI for colonoscopy,  Diabetic Foot Exam recommended, See Nurse Notes at the end of this note  Additional Screening:  Vision Screening: Recommended annual ophthalmology exams for early detection of glaucoma and other disorders of the eye. Is the patient up to date with their annual eye exam?  Yes  Who is the provider or what is the name of the office in which the patient attends annual eye exams? Dr Wanda Mae   Dental Screening: Recommended annual dental exams for proper oral hygiene  Community Resource Referral / Chronic Care Management: CRR required this visit?  No   CCM required this visit?   No   Plan:    I have personally reviewed and noted the following in the patient's chart:   Medical and social history Use of alcohol, tobacco or illicit drugs  Current medications and supplements including opioid prescriptions. Patient is not currently taking opioid prescriptions. Functional ability and status Nutritional status Physical activity Advanced directives List of other physicians Hospitalizations, surgeries, and ER visits in previous 12 months Vitals Screenings to include cognitive, depression, and falls Referrals and appointments  In addition, I have reviewed and discussed with patient certain preventive protocols, quality metrics, and best practice recommendations. A written personalized care plan for preventive services as well as general preventive health recommendations were provided to patient.   Ellouise VEAR Haws, LPN   [id number]   After Visit Summary: (MyChart) Due to this being a telephonic visit, the after visit summary with patients personalized plan was offered to patient via MyChart   Notes: Nothing significant to report at this time.

## 2024-01-26 ENCOUNTER — Encounter: Admitting: Family Medicine

## 2024-01-26 ENCOUNTER — Other Ambulatory Visit: Payer: Self-pay | Admitting: Family Medicine

## 2024-02-06 ENCOUNTER — Other Ambulatory Visit: Payer: Self-pay

## 2024-02-06 DIAGNOSIS — D469 Myelodysplastic syndrome, unspecified: Secondary | ICD-10-CM

## 2024-02-06 DIAGNOSIS — D631 Anemia in chronic kidney disease: Secondary | ICD-10-CM

## 2024-02-07 ENCOUNTER — Inpatient Hospital Stay: Attending: Hematology

## 2024-02-07 ENCOUNTER — Inpatient Hospital Stay

## 2024-02-07 VITALS — BP 157/77 | HR 76 | Resp 16

## 2024-02-07 DIAGNOSIS — D631 Anemia in chronic kidney disease: Secondary | ICD-10-CM

## 2024-02-07 DIAGNOSIS — D469 Myelodysplastic syndrome, unspecified: Secondary | ICD-10-CM

## 2024-02-07 LAB — CBC WITH DIFFERENTIAL (CANCER CENTER ONLY)
Abs Immature Granulocytes: 0.01 K/uL (ref 0.00–0.07)
Basophils Absolute: 0.1 K/uL (ref 0.0–0.1)
Basophils Relative: 1 %
Eosinophils Absolute: 0.1 K/uL (ref 0.0–0.5)
Eosinophils Relative: 3 %
HCT: 28.3 % — ABNORMAL LOW (ref 39.0–52.0)
Hemoglobin: 9.6 g/dL — ABNORMAL LOW (ref 13.0–17.0)
Immature Granulocytes: 0 %
Lymphocytes Relative: 41 %
Lymphs Abs: 1.8 K/uL (ref 0.7–4.0)
MCH: 33.7 pg (ref 26.0–34.0)
MCHC: 33.9 g/dL (ref 30.0–36.0)
MCV: 99.3 fL (ref 80.0–100.0)
Monocytes Absolute: 0.5 K/uL (ref 0.1–1.0)
Monocytes Relative: 10 %
Neutro Abs: 2 K/uL (ref 1.7–7.7)
Neutrophils Relative %: 45 %
Platelet Count: 214 K/uL (ref 150–400)
RBC: 2.85 MIL/uL — ABNORMAL LOW (ref 4.22–5.81)
RDW: 15.2 % (ref 11.5–15.5)
WBC Count: 4.5 K/uL (ref 4.0–10.5)
nRBC: 0 % (ref 0.0–0.2)

## 2024-02-07 LAB — CMP (CANCER CENTER ONLY)
ALT: 15 U/L (ref 0–44)
AST: 19 U/L (ref 15–41)
Albumin: 3.8 g/dL (ref 3.5–5.0)
Alkaline Phosphatase: 20 U/L — ABNORMAL LOW (ref 38–126)
Anion gap: 7 (ref 5–15)
BUN: 32 mg/dL — ABNORMAL HIGH (ref 8–23)
CO2: 25 mmol/L (ref 22–32)
Calcium: 9.6 mg/dL (ref 8.9–10.3)
Chloride: 106 mmol/L (ref 98–111)
Creatinine: 2.29 mg/dL — ABNORMAL HIGH (ref 0.61–1.24)
GFR, Estimated: 29 mL/min — ABNORMAL LOW (ref >60)
Glucose, Bld: 130 mg/dL — ABNORMAL HIGH (ref 70–99)
Potassium: 4.6 mmol/L (ref 3.5–5.1)
Sodium: 138 mmol/L (ref 135–145)
Total Bilirubin: 0.6 mg/dL (ref 0.0–1.2)
Total Protein: 6.4 g/dL — ABNORMAL LOW (ref 6.5–8.1)

## 2024-02-07 LAB — FERRITIN: Ferritin: 2156 ng/mL — ABNORMAL HIGH (ref 24–336)

## 2024-02-07 LAB — IRON AND IRON BINDING CAPACITY (CC-WL,HP ONLY)
Iron: 162 ug/dL (ref 45–182)
Saturation Ratios: 66 % — ABNORMAL HIGH (ref 17.9–39.5)
TIBC: 245 ug/dL — ABNORMAL LOW (ref 250–450)
UIBC: 83 ug/dL

## 2024-02-07 LAB — VITAMIN B12: Vitamin B-12: 1726 pg/mL — ABNORMAL HIGH (ref 180–914)

## 2024-02-07 MED ORDER — EPOETIN ALFA-EPBX 40000 UNIT/ML IJ SOLN
40000.0000 [IU] | Freq: Once | INTRAMUSCULAR | Status: AC
  Administered 2024-02-07: 40000 [IU] via SUBCUTANEOUS
  Filled 2024-02-07: qty 1

## 2024-03-01 DIAGNOSIS — H40053 Ocular hypertension, bilateral: Secondary | ICD-10-CM | POA: Diagnosis not present

## 2024-03-01 DIAGNOSIS — H40013 Open angle with borderline findings, low risk, bilateral: Secondary | ICD-10-CM | POA: Diagnosis not present

## 2024-03-01 DIAGNOSIS — H2513 Age-related nuclear cataract, bilateral: Secondary | ICD-10-CM | POA: Diagnosis not present

## 2024-03-01 DIAGNOSIS — E119 Type 2 diabetes mellitus without complications: Secondary | ICD-10-CM | POA: Diagnosis not present

## 2024-03-01 DIAGNOSIS — H52203 Unspecified astigmatism, bilateral: Secondary | ICD-10-CM | POA: Diagnosis not present

## 2024-03-01 LAB — OPHTHALMOLOGY REPORT-SCANNED

## 2024-03-02 ENCOUNTER — Other Ambulatory Visit: Payer: Self-pay | Admitting: Family Medicine

## 2024-03-05 ENCOUNTER — Other Ambulatory Visit: Payer: Self-pay

## 2024-03-05 DIAGNOSIS — D469 Myelodysplastic syndrome, unspecified: Secondary | ICD-10-CM

## 2024-03-05 DIAGNOSIS — D631 Anemia in chronic kidney disease: Secondary | ICD-10-CM

## 2024-03-06 ENCOUNTER — Inpatient Hospital Stay: Attending: Hematology

## 2024-03-06 ENCOUNTER — Inpatient Hospital Stay

## 2024-03-06 VITALS — BP 159/68 | HR 79 | Temp 97.9°F | Resp 16

## 2024-03-06 DIAGNOSIS — D631 Anemia in chronic kidney disease: Secondary | ICD-10-CM | POA: Diagnosis present

## 2024-03-06 DIAGNOSIS — D469 Myelodysplastic syndrome, unspecified: Secondary | ICD-10-CM

## 2024-03-06 DIAGNOSIS — N183 Chronic kidney disease, stage 3 unspecified: Secondary | ICD-10-CM | POA: Insufficient documentation

## 2024-03-06 DIAGNOSIS — E1122 Type 2 diabetes mellitus with diabetic chronic kidney disease: Secondary | ICD-10-CM | POA: Insufficient documentation

## 2024-03-06 LAB — CBC WITH DIFFERENTIAL (CANCER CENTER ONLY)
Abs Immature Granulocytes: 0.01 K/uL (ref 0.00–0.07)
Basophils Absolute: 0 K/uL (ref 0.0–0.1)
Basophils Relative: 1 %
Eosinophils Absolute: 0.1 K/uL (ref 0.0–0.5)
Eosinophils Relative: 3 %
HCT: 28 % — ABNORMAL LOW (ref 39.0–52.0)
Hemoglobin: 9.3 g/dL — ABNORMAL LOW (ref 13.0–17.0)
Immature Granulocytes: 0 %
Lymphocytes Relative: 34 %
Lymphs Abs: 1.6 K/uL (ref 0.7–4.0)
MCH: 33.2 pg (ref 26.0–34.0)
MCHC: 33.2 g/dL (ref 30.0–36.0)
MCV: 100 fL (ref 80.0–100.0)
Monocytes Absolute: 0.5 K/uL (ref 0.1–1.0)
Monocytes Relative: 11 %
Neutro Abs: 2.4 K/uL (ref 1.7–7.7)
Neutrophils Relative %: 51 %
Platelet Count: 175 K/uL (ref 150–400)
RBC: 2.8 MIL/uL — ABNORMAL LOW (ref 4.22–5.81)
RDW: 14.2 % (ref 11.5–15.5)
WBC Count: 4.8 K/uL (ref 4.0–10.5)
nRBC: 0 % (ref 0.0–0.2)

## 2024-03-06 LAB — IRON AND IRON BINDING CAPACITY (CC-WL,HP ONLY)
Iron: 105 ug/dL (ref 45–182)
Saturation Ratios: 46 % — ABNORMAL HIGH (ref 17.9–39.5)
TIBC: 230 ug/dL — ABNORMAL LOW (ref 250–450)
UIBC: 125 ug/dL

## 2024-03-06 LAB — VITAMIN B12: Vitamin B-12: 2223 pg/mL — ABNORMAL HIGH (ref 180–914)

## 2024-03-06 LAB — CMP (CANCER CENTER ONLY)
ALT: 18 U/L (ref 0–44)
AST: 31 U/L (ref 15–41)
Albumin: 3.7 g/dL (ref 3.5–5.0)
Alkaline Phosphatase: 23 U/L — ABNORMAL LOW (ref 38–126)
Anion gap: 12 (ref 5–15)
BUN: 30 mg/dL — ABNORMAL HIGH (ref 8–23)
CO2: 24 mmol/L (ref 22–32)
Calcium: 9.6 mg/dL (ref 8.9–10.3)
Chloride: 102 mmol/L (ref 98–111)
Creatinine: 2.51 mg/dL — ABNORMAL HIGH (ref 0.61–1.24)
GFR, Estimated: 26 mL/min — ABNORMAL LOW (ref >60)
Glucose, Bld: 127 mg/dL — ABNORMAL HIGH (ref 70–99)
Potassium: 4.5 mmol/L (ref 3.5–5.1)
Sodium: 138 mmol/L (ref 135–145)
Total Bilirubin: 0.4 mg/dL (ref 0.0–1.2)
Total Protein: 6.2 g/dL — ABNORMAL LOW (ref 6.5–8.1)

## 2024-03-06 LAB — FERRITIN: Ferritin: 1877 ng/mL — ABNORMAL HIGH (ref 24–336)

## 2024-03-06 MED ORDER — EPOETIN ALFA-EPBX 40000 UNIT/ML IJ SOLN
40000.0000 [IU] | Freq: Once | INTRAMUSCULAR | Status: AC
  Administered 2024-03-06: 40000 [IU] via SUBCUTANEOUS
  Filled 2024-03-06: qty 1

## 2024-04-02 DIAGNOSIS — D469 Myelodysplastic syndrome, unspecified: Secondary | ICD-10-CM

## 2024-04-02 DIAGNOSIS — D631 Anemia in chronic kidney disease: Secondary | ICD-10-CM

## 2024-04-03 ENCOUNTER — Inpatient Hospital Stay

## 2024-04-03 ENCOUNTER — Other Ambulatory Visit: Payer: Self-pay | Admitting: Family Medicine

## 2024-04-03 ENCOUNTER — Inpatient Hospital Stay: Attending: Hematology

## 2024-04-03 VITALS — BP 171/57 | HR 75 | Resp 17

## 2024-04-03 DIAGNOSIS — D631 Anemia in chronic kidney disease: Secondary | ICD-10-CM

## 2024-04-03 DIAGNOSIS — D469 Myelodysplastic syndrome, unspecified: Secondary | ICD-10-CM

## 2024-04-03 DIAGNOSIS — E538 Deficiency of other specified B group vitamins: Secondary | ICD-10-CM | POA: Insufficient documentation

## 2024-04-03 DIAGNOSIS — Z79620 Long term (current) use of immunosuppressive biologic: Secondary | ICD-10-CM | POA: Insufficient documentation

## 2024-04-03 DIAGNOSIS — E1122 Type 2 diabetes mellitus with diabetic chronic kidney disease: Secondary | ICD-10-CM | POA: Diagnosis present

## 2024-04-03 DIAGNOSIS — N183 Chronic kidney disease, stage 3 unspecified: Secondary | ICD-10-CM | POA: Insufficient documentation

## 2024-04-03 LAB — CBC WITH DIFFERENTIAL (CANCER CENTER ONLY)
Abs Immature Granulocytes: 0.01 K/uL (ref 0.00–0.07)
Basophils Absolute: 0.1 K/uL (ref 0.0–0.1)
Basophils Relative: 1 %
Eosinophils Absolute: 0.1 K/uL (ref 0.0–0.5)
Eosinophils Relative: 2 %
HCT: 26.9 % — ABNORMAL LOW (ref 39.0–52.0)
Hemoglobin: 9.2 g/dL — ABNORMAL LOW (ref 13.0–17.0)
Immature Granulocytes: 0 %
Lymphocytes Relative: 30 %
Lymphs Abs: 1.6 K/uL (ref 0.7–4.0)
MCH: 34.3 pg — ABNORMAL HIGH (ref 26.0–34.0)
MCHC: 34.2 g/dL (ref 30.0–36.0)
MCV: 100.4 fL — ABNORMAL HIGH (ref 80.0–100.0)
Monocytes Absolute: 0.5 K/uL (ref 0.1–1.0)
Monocytes Relative: 9 %
Neutro Abs: 3.1 K/uL (ref 1.7–7.7)
Neutrophils Relative %: 58 %
Platelet Count: 195 K/uL (ref 150–400)
RBC: 2.68 MIL/uL — ABNORMAL LOW (ref 4.22–5.81)
RDW: 14.3 % (ref 11.5–15.5)
WBC Count: 5.3 K/uL (ref 4.0–10.5)
nRBC: 0 % (ref 0.0–0.2)

## 2024-04-03 LAB — CMP (CANCER CENTER ONLY)
ALT: 19 U/L (ref 0–44)
AST: 27 U/L (ref 15–41)
Albumin: 4.1 g/dL (ref 3.5–5.0)
Alkaline Phosphatase: 22 U/L — ABNORMAL LOW (ref 38–126)
Anion gap: 10 (ref 5–15)
BUN: 34 mg/dL — ABNORMAL HIGH (ref 8–23)
CO2: 24 mmol/L (ref 22–32)
Calcium: 9.8 mg/dL (ref 8.9–10.3)
Chloride: 105 mmol/L (ref 98–111)
Creatinine: 2.26 mg/dL — ABNORMAL HIGH (ref 0.61–1.24)
GFR, Estimated: 30 mL/min — ABNORMAL LOW
Glucose, Bld: 132 mg/dL — ABNORMAL HIGH (ref 70–99)
Potassium: 5.1 mmol/L (ref 3.5–5.1)
Sodium: 139 mmol/L (ref 135–145)
Total Bilirubin: 0.8 mg/dL (ref 0.0–1.2)
Total Protein: 6.5 g/dL (ref 6.5–8.1)

## 2024-04-03 LAB — IRON AND IRON BINDING CAPACITY (CC-WL,HP ONLY)
Iron: 192 ug/dL — ABNORMAL HIGH (ref 45–182)
Saturation Ratios: 74 % — ABNORMAL HIGH (ref 17.9–39.5)
TIBC: 260 ug/dL (ref 250–450)
UIBC: 68 ug/dL

## 2024-04-03 LAB — VITAMIN B12: Vitamin B-12: 1702 pg/mL — ABNORMAL HIGH (ref 180–914)

## 2024-04-03 LAB — FERRITIN: Ferritin: 1963 ng/mL — ABNORMAL HIGH (ref 24–336)

## 2024-04-03 MED ORDER — EPOETIN ALFA-EPBX 40000 UNIT/ML IJ SOLN
40000.0000 [IU] | Freq: Once | INTRAMUSCULAR | Status: AC
  Administered 2024-04-03: 40000 [IU] via SUBCUTANEOUS
  Filled 2024-04-03: qty 1

## 2024-04-06 ENCOUNTER — Other Ambulatory Visit: Payer: Self-pay | Admitting: Family Medicine

## 2024-04-13 ENCOUNTER — Encounter: Payer: Self-pay | Admitting: Internal Medicine

## 2024-04-17 ENCOUNTER — Other Ambulatory Visit: Payer: Self-pay | Admitting: Family Medicine

## 2024-04-30 ENCOUNTER — Other Ambulatory Visit: Payer: Self-pay

## 2024-04-30 DIAGNOSIS — D631 Anemia in chronic kidney disease: Secondary | ICD-10-CM

## 2024-04-30 DIAGNOSIS — D469 Myelodysplastic syndrome, unspecified: Secondary | ICD-10-CM

## 2024-05-01 ENCOUNTER — Inpatient Hospital Stay

## 2024-05-01 ENCOUNTER — Inpatient Hospital Stay: Attending: Hematology | Admitting: Hematology

## 2024-05-01 ENCOUNTER — Inpatient Hospital Stay: Attending: Hematology

## 2024-05-01 DIAGNOSIS — D469 Myelodysplastic syndrome, unspecified: Secondary | ICD-10-CM

## 2024-05-01 DIAGNOSIS — D631 Anemia in chronic kidney disease: Secondary | ICD-10-CM

## 2024-05-01 LAB — IRON AND IRON BINDING CAPACITY (CC-WL,HP ONLY)
Iron: 143 ug/dL (ref 45–182)
Saturation Ratios: 58 % — ABNORMAL HIGH (ref 17.9–39.5)
TIBC: 245 ug/dL — ABNORMAL LOW (ref 250–450)
UIBC: 102 ug/dL

## 2024-05-01 LAB — CBC WITH DIFFERENTIAL (CANCER CENTER ONLY)
Abs Immature Granulocytes: 0.01 10*3/uL (ref 0.00–0.07)
Basophils Absolute: 0 10*3/uL (ref 0.0–0.1)
Basophils Relative: 1 %
Eosinophils Absolute: 0.2 10*3/uL (ref 0.0–0.5)
Eosinophils Relative: 3 %
HCT: 27.2 % — ABNORMAL LOW (ref 39.0–52.0)
Hemoglobin: 9.2 g/dL — ABNORMAL LOW (ref 13.0–17.0)
Immature Granulocytes: 0 %
Lymphocytes Relative: 35 %
Lymphs Abs: 1.7 10*3/uL (ref 0.7–4.0)
MCH: 34.1 pg — ABNORMAL HIGH (ref 26.0–34.0)
MCHC: 33.8 g/dL (ref 30.0–36.0)
MCV: 100.7 fL — ABNORMAL HIGH (ref 80.0–100.0)
Monocytes Absolute: 0.5 10*3/uL (ref 0.1–1.0)
Monocytes Relative: 10 %
Neutro Abs: 2.4 10*3/uL (ref 1.7–7.7)
Neutrophils Relative %: 51 %
Platelet Count: 192 10*3/uL (ref 150–400)
RBC: 2.7 MIL/uL — ABNORMAL LOW (ref 4.22–5.81)
RDW: 14.2 % (ref 11.5–15.5)
WBC Count: 4.7 10*3/uL (ref 4.0–10.5)
nRBC: 0 % (ref 0.0–0.2)

## 2024-05-01 LAB — CMP (CANCER CENTER ONLY)
ALT: 17 U/L (ref 0–44)
AST: 26 U/L (ref 15–41)
Albumin: 4 g/dL (ref 3.5–5.0)
Alkaline Phosphatase: 21 U/L — ABNORMAL LOW (ref 38–126)
Anion gap: 11 (ref 5–15)
BUN: 31 mg/dL — ABNORMAL HIGH (ref 8–23)
CO2: 24 mmol/L (ref 22–32)
Calcium: 9.7 mg/dL (ref 8.9–10.3)
Chloride: 104 mmol/L (ref 98–111)
Creatinine: 2.15 mg/dL — ABNORMAL HIGH (ref 0.61–1.24)
GFR, Estimated: 32 mL/min — ABNORMAL LOW
Glucose, Bld: 124 mg/dL — ABNORMAL HIGH (ref 70–99)
Potassium: 4.5 mmol/L (ref 3.5–5.1)
Sodium: 139 mmol/L (ref 135–145)
Total Bilirubin: 0.6 mg/dL (ref 0.0–1.2)
Total Protein: 6.3 g/dL — ABNORMAL LOW (ref 6.5–8.1)

## 2024-05-01 LAB — VITAMIN B12: Vitamin B-12: 2213 pg/mL — ABNORMAL HIGH (ref 180–914)

## 2024-05-01 LAB — FERRITIN: Ferritin: 1820 ng/mL — ABNORMAL HIGH (ref 24–336)

## 2024-05-01 MED ORDER — EPOETIN ALFA-EPBX 40000 UNIT/ML IJ SOLN
40000.0000 [IU] | Freq: Once | INTRAMUSCULAR | Status: AC
  Administered 2024-05-01: 40000 [IU] via SUBCUTANEOUS
  Filled 2024-05-01: qty 1

## 2024-05-01 NOTE — Progress Notes (Shared)
 " HEMATOLOGY ONCOLOGY PROGRESS NOTE  Date of service: 05/01/2024  Patient Care Team: Kennyth Worth HERO, MD as PCP - General (Family Medicine) Leslee Reusing, MD as Attending Physician (Ophthalmology) Alline Lenis, MD (Inactive) as Attending Physician (Urology) Douglass Lunger, MD as Consulting Physician (Nephrology) Dow Maxwell, PT as Physical Therapist (Physical Therapy)  CHIEF COMPLAINT/PURPOSE OF CONSULTATION: Follow-up for continued evaluation and management of multifactorial anemia   HISTORY OF PRESENTING ILLNESS: Mitchell Alvarado is a wonderful 74 y.o. male who has been referred to us  by Dr Alyce Staff for evaluation and management of anemia. The pt reports that he is doing well overall.    The pt reports that he has been taking monthly Vitamin B12 injections for the last 4-5 years, and his deficiency was first realized through routine blood work. He was taken off Metformin  at the time. He has taken Omeprazole  for 20 years for his Barrett's esophagus. He denies any food intolerances or thyroid  problems. He notes that he has had some fatigue which he attributes to getting older.    He has seen a nephrologist several years ago and was discharged after replacing his Vitamin D  satisfactorily and he denies any liver problems as well.    Most recent lab results (05/29/17) of CBC  is as follows: all values are WNL except for WBC at 3.5k, RBC at 3.04, HGB at 11.3, HCT at 32.9, MCV at 108.0.  Review of CBC w/diff over the last two years reveals stability in the above abnormalities.    On review of systems, pt reports some fatigue, stable weight, and denies problems of infection, bleeding, back pains, pain along the spine, abdominal pains, changes in bowel habits, leg selling, urinary discomfort, and any other symptoms.    On PMHx the pt reports neuropathy, prostate surgery in 2000, denies Afib. On Social Hx the pt reports 10-14 glasses of wine each week  and denies any concern for excessive  consumption. He denies smoking and smoked cigarettes for one year a long time ago  On Family Hx the pt reports paternal heart attack.     SUMMARY OF ONCOLOGIC HISTORY: Oncology History   No problem history exists.    INTERVAL HISTORY: Mitchell Alvarado is a 74 y.o. male who is here today for continued evaluation and management of multifactorial anemia . {ELcompanions/ambulations (Optional):33762}  he was last seen by me on 01/08/2024; at the time he mentioned experiencing mild muscle weakness.  Today, he notes that he is doing well overall.  He was lost some weight over the last few years by eating less and exercising.  Denies difficulties passing urine, abdominal pain, leg swelling,  abnormal bowel habits, urination, and infection issues.   REVIEW OF SYSTEMS:   10 Point review of systems of done and is negative except as noted above.  MEDICAL HISTORY Past Medical History:  Diagnosis Date   Allergy    ANEMIA, IRON DEFICIENCY 12/23/2009   Barrett's esophagus without dysplasia 05/2012   DIABETES MELLITUS, TYPE II 12/30/2006   ECZEMA 05/15/2009   GERD 12/30/2006   HYPERLIPIDEMIA 12/30/2006   HYPERTENSION 12/30/2006   HYPERTHYROIDISM 06/03/2008   Pernicious anemia 12/30/2006   Personal history of colonic polyps - adenoma 07/22/2004    SURGICAL HISTORY Past Surgical History:  Procedure Laterality Date   COLONOSCOPY  multiple   COLONOSCOPY WITH PROPOFOL   07/27/2015   Dr.Gessner   ESOPHAGOGASTRODUODENOSCOPY  multiple   POLYPECTOMY     TRANSURETHRAL RESECTION OF PROSTATE  03/28/1998    SOCIAL HISTORY  Social History[1]  Social History   Social History Narrative   Married   Retired merchandiser, 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 [employer]   7 drinks/week   Former smoker   No drug use    SOCIAL DRIVERS OF HEALTH SDOH Screenings   Food Insecurity: No Food Insecurity (01/22/2024)  Housing: Low Risk (01/22/2024)  Transportation Needs: No  Transportation Needs (01/22/2024)  Utilities: Not At Risk (01/24/2024)  Alcohol Screen: Low Risk (01/22/2024)  Depression (PHQ2-9): Low Risk (01/24/2024)  Financial Resource Strain: Low Risk (01/22/2024)  Physical Activity: Inactive (01/22/2024)  Social Connections: Moderately Isolated (01/22/2024)  Stress: No Stress Concern Present (01/22/2024)  Tobacco Use: Medium Risk (01/24/2024)  Health Literacy: Adequate Health Literacy (01/24/2024)     FAMILY HISTORY Family History  Problem Relation Age of Onset   Heart attack Father        MI   Cancer Neg Hx    Colon cancer Neg Hx    Esophageal cancer Neg Hx    Stomach cancer Neg Hx      ALLERGIES: has no known allergies.  MEDICATIONS  Current Outpatient Medications  Medication Sig Dispense Refill   aspirin  81 MG tablet Take 1 tablet (81 mg total) by mouth daily. 90 tablet 3   calcitRIOL  (ROCALTROL ) 0.25 MCG capsule TAKE 1 CAPSULE (0.25 MCG TOTAL) BY MOUTH EVERY MONDAY, WEDNESDAY, AND FRIDAY 36 capsule 0   carvedilol  (COREG ) 3.125 MG tablet TAKE 1 TAB BY MOUTH 2 TIMES DAILY. PLEASE CALL [phone] TO SCHEDULE YOUR PHYSICAL IN APRIL FOR FURTHER REFILLS 180 tablet 1   Cholecalciferol (VITAMIN D ) 2000 units CAPS Take 2,000 Units by mouth daily.     ezetimibe -simvastatin  (VYTORIN ) 10-80 MG tablet TAKE 1 TABLET BY MOUTH EVERY DAY 90 tablet 1   fenofibrate  (TRICOR ) 145 MG tablet TAKE 1 TABLET BY MOUTH EVERY DAY 90 tablet 0   glucose blood (ONETOUCH VERIO) test strip 1 each by Other route daily. And lancets 1/day 100 each 3   hydrochlorothiazide  (HYDRODIURIL ) 25 MG tablet TAKE 1 TABLET BY MOUTH EVERY DAY 90 tablet 0   latanoprost (XALATAN) 0.005 % ophthalmic solution Place 1 drop into both eyes at bedtime.     omeprazole  (PRILOSEC ) 20 MG capsule TAKE 1 CAPSULE BY MOUTH EVERY DAY 90 capsule 3   Testosterone  20.25 MG/ACT (1.62%) GEL PLACE 1 PUMP ONTO THE SKIN DAILY IN THE AFTERNOON. 75 g 0   vitamin B-12 (CYANOCOBALAMIN ) 1000 MCG tablet Take  1,000 mcg by mouth daily.     No current facility-administered medications for this visit.    PHYSICAL EXAMINATION: ECOG PERFORMANCE STATUS: 0 - Asymptomatic VITALS: Vitals:   05/01/24 1354  BP: (!) 137/49  Pulse: 80  Resp: 20  Temp: (!) 97.3 F (36.3 C)  SpO2: 99%   Filed Weights   05/01/24 1354  Weight: 237 lb 6.4 oz (107.7 kg)   Body mass index is 30.9 kg/m.  GENERAL: alert, in no acute distress and comfortable SKIN: no acute rashes, no significant lesions EYES: conjunctiva are pink and non-injected, sclera anicteric OROPHARYNX: MMM, no exudates, no oropharyngeal erythema or ulceration NECK: supple, no JVD LYMPH:  no palpable lymphadenopathy in the cervical, axillary or inguinal regions LUNGS: clear to auscultation b/l with normal respiratory effort HEART: regular rate & rhythm ABDOMEN:  normoactive bowel sounds , non tender, not distended, no hepatosplenomegaly Extremity: no pedal edema PSYCH: alert & oriented x 3 with fluent speech NEURO: no focal motor/sensory deficits  LABORATORY DATA:   I  have reviewed the data as listed     Latest Ref Rng & Units 05/01/2024    1:41 PM 04/03/2024    2:06 PM 03/06/2024    1:55 PM  CBC EXTENDED  WBC 4.0 - 10.5 K/uL 4.7  5.3  4.8   RBC 4.22 - 5.81 MIL/uL 2.70  2.68  2.80   Hemoglobin 13.0 - 17.0 g/dL 9.2  9.2  9.3   HCT 60.9 - 52.0 % 27.2  26.9  28.0   Platelets 150 - 400 K/uL 192  195  175   NEUT# 1.7 - 7.7 K/uL 2.4  3.1  2.4   Lymph# 0.7 - 4.0 K/uL 1.7  1.6  1.6         Latest Ref Rng & Units 05/01/2024    1:41 PM 04/03/2024    2:06 PM 03/06/2024    1:55 PM  CMP  Glucose 70 - 99 mg/dL 875  867  872   BUN 8 - 23 mg/dL 31  34  30   Creatinine 0.61 - 1.24 mg/dL 7.84  7.73  7.48   Sodium 135 - 145 mmol/L 139  139  138   Potassium 3.5 - 5.1 mmol/L 4.5  5.1  4.5   Chloride 98 - 111 mmol/L 104  105  102   CO2 22 - 32 mmol/L 24  24  24    Calcium 8.9 - 10.3 mg/dL 9.7  9.8  9.6   Total Protein 6.5 - 8.1 g/dL 6.3  6.5  6.2    Total Bilirubin 0.0 - 1.2 mg/dL 0.6  0.8  0.4   Alkaline Phos 38 - 126 U/L 21  22  23    AST 15 - 41 U/L 26  27  31    ALT 0 - 44 U/L 17  19  18     Iron and Iron Binding Capacity Component     Latest Ref Rng 05/01/2024  Iron     45 - 182 ug/dL 856   Saturation Ratios     17.9 - 39.5 % 58 (H)   TIBC     250 - 450 ug/dL 754 (L)   UIBC     ug/dL 897     Legend: (H) High (L) Low  Component     Latest Ref Rng & Units 09/07/2017  IgG (Immunoglobin G), Serum     700 - 1,600 mg/dL 176  IgA     61 - 562 mg/dL 751  IgM (Immunoglobulin M), Srm     20 - 172 mg/dL 856  Total Protein ELP     6.0 - 8.5 g/dL 6.7  Albumin SerPl Elph-Mcnc     2.9 - 4.4 g/dL 4.0  Alpha 1     0.0 - 0.4 g/dL 0.2  Alpha2 Glob SerPl Elph-Mcnc     0.4 - 1.0 g/dL 0.8  B-Globulin SerPl Elph-Mcnc     0.7 - 1.3 g/dL 1.1  Gamma Glob SerPl Elph-Mcnc     0.4 - 1.8 g/dL 0.6  M Protein SerPl Elph-Mcnc     Not Observed g/dL Not Observed  Globulin, Total     2.2 - 3.9 g/dL 2.7  Albumin/Glob SerPl     0.7 - 1.7 1.5  IFE 1      Comment  Please Note (HCV):      Comment  Folate, Hemolysate     Not Estab. ng/mL 204.0  HCT     37.5 - 51.0 % 31.1 (L)  Folate, RBC     >  498 ng/mL 656  Vitamin B12     180 - 914 pg/mL 6,847 (H)  TSH     0.320 - 4.118 uIU/mL 1.016  Ferritin     22 - 316 ng/mL 867 (H)  Copper      72 - 166 ug/dL 97  Parietal Cell Antibody-IgG     0.0 - 20.0 Units 10.9  Intrinsic Factor     0.0 - 1.1 AU/mL 11.8 (H)     RADIOGRAPHIC STUDIES: I have personally reviewed the radiological images as listed and agreed with the findings in the report. No results found.  ASSESSMENT & PLAN:  74 y.o. male with  1. Macrocytic Anemia - multifactorial -- pernicious anemia with anti IF antibodies + Anemia from CKD + cannot r/o an element of low grade MDS ? ? ETOH use  , Low testosterone  levels Review of patient's CBCs w/diff over the last two years reveals stability on the above abnormalities  Patient  appears to have pernicious anemia with anti intrinsic factor antibodies. Currently no B12 or folate deficiency deficiency.but could have other B vit deficiencies. No monoclonal paraproteinemia. Concern for ETOH use Has some CKD that could be contributing to the anemia as well. No known liver disease No overt bleeding noted.   2. B12 deficiency -multifactorial -- pernicious anemia + previous metformin  use + current PPI use.   3.  Elevated ferritin levels Patient previously had Barrett's esophagus and iron deficiency anemia.  Currently his ferritin is remained elevated likely in the context of significant previous iron replacement. -Hold off on any iron supplementation. HFE gene mutation study is negative for any genetic findings suggestive of hemochromatosis.  PLAN: - Discussed lab results on 05/01/2024 in detail with patient: - CMP   - Creatinine:  2.15 - Calcium: 9.7 - CBC  - Hemoglobin: 9.2  - WBC: 4.7  - Platelets: 192 - Iron and Iron Binding Pending  - Sat Ratio: 58  - Iron: 143 - Ferritin pending - Recommend reconnecting with Nephrology to watch kidneys - Erythropoeitin shot evey 4 weeks x 6 doses with labs -  MD visit in 4 months FOLLOW-UP in 4 months for labs and follow-up with Dr. Onesimo.  The total time spent in the appointment was *** minutes* .  All of the patient's questions were answered and the patient knows to call the clinic with any problems, questions, or concerns.  Emaline Onesimo MD MS AAHIVMS [facility] [place] [facility] Hematology/Oncology Physician [facility]  *Total Encounter Time as defined by the Centers for Medicare and Medicaid Services includes, in addition to the face-to-face time of a patient visit (documented in the note above) non-face-to-face time: obtaining and reviewing outside history, ordering and reviewing medications, tests or procedures, care coordination (communications with other health care professionals or caregivers) and documentation in the  medical record.  I, Marijo Sharps, acting as a neurosurgeon for Emaline Onesimo, MD.,have documented all relevant documentation on the behalf of Emaline Onesimo, MD,as directed by  Emaline Onesimo, MD while in the presence of Emaline Onesimo, MD.  I have reviewed the above documentation for accuracy and completeness, and I agree with the above.  Emaline Onesimo, MD      [1]  Social History Tobacco Use   Smoking status: Former   Smokeless tobacco: Never  Vaping Use   Vaping status: Never Used  Substance Use Topics   Alcohol use: Yes    Alcohol/week: 7.0 - 10.0 standard drinks of alcohol    Types: 7 - 10 Glasses  of wine per week    Comment: 7-10 glasses wine weekly   Drug use: No   "

## 2024-05-21 ENCOUNTER — Encounter

## 2024-05-29 ENCOUNTER — Inpatient Hospital Stay

## 2024-05-29 ENCOUNTER — Inpatient Hospital Stay: Attending: Hematology

## 2024-06-03 ENCOUNTER — Encounter: Admitting: Internal Medicine

## 2024-06-26 ENCOUNTER — Inpatient Hospital Stay: Attending: Hematology

## 2024-06-26 ENCOUNTER — Inpatient Hospital Stay

## 2024-07-24 ENCOUNTER — Inpatient Hospital Stay

## 2024-08-21 ENCOUNTER — Inpatient Hospital Stay: Attending: Hematology

## 2024-08-21 ENCOUNTER — Inpatient Hospital Stay

## 2024-09-13 ENCOUNTER — Encounter: Admitting: Family Medicine

## 2024-09-18 ENCOUNTER — Inpatient Hospital Stay

## 2024-09-18 ENCOUNTER — Inpatient Hospital Stay: Attending: Hematology | Admitting: Hematology
